# Patient Record
Sex: Male | Born: 1945 | Race: White | Hispanic: No | Marital: Married | Smoking: Never smoker
Health system: Southern US, Community
[De-identification: ages and names within clinical notes are randomized; demographics above are authoritative.]

## PROBLEM LIST (undated history)

## (undated) DIAGNOSIS — Z950 Presence of cardiac pacemaker: Secondary | ICD-10-CM

## (undated) DIAGNOSIS — N400 Enlarged prostate without lower urinary tract symptoms: Secondary | ICD-10-CM

## (undated) DIAGNOSIS — I495 Sick sinus syndrome: Secondary | ICD-10-CM

## (undated) DIAGNOSIS — R011 Cardiac murmur, unspecified: Secondary | ICD-10-CM

## (undated) DIAGNOSIS — I4821 Permanent atrial fibrillation: Secondary | ICD-10-CM

## (undated) HISTORY — DX: Presence of cardiac pacemaker: Z95.0

## (undated) HISTORY — DX: Permanent atrial fibrillation: I48.21

## (undated) HISTORY — DX: Benign prostatic hyperplasia without lower urinary tract symptoms: N40.0

## (undated) HISTORY — DX: Sick sinus syndrome: I49.5

## (undated) HISTORY — DX: Cardiac murmur, unspecified: R01.1

---

## 1997-01-26 HISTORY — PX: CARDIAC CATHETERIZATION: SHX172

## 2000-02-01 ENCOUNTER — Ambulatory Visit (HOSPITAL_COMMUNITY): Admission: RE | Admit: 2000-02-01 | Discharge: 2000-02-01 | Payer: Self-pay | Admitting: Orthopaedic Surgery

## 2001-04-14 ENCOUNTER — Encounter: Payer: Self-pay | Admitting: Cardiovascular Disease

## 2001-04-14 ENCOUNTER — Ambulatory Visit (HOSPITAL_COMMUNITY): Admission: RE | Admit: 2001-04-14 | Discharge: 2001-04-15 | Payer: Self-pay | Admitting: Cardiovascular Disease

## 2001-04-15 ENCOUNTER — Encounter: Payer: Self-pay | Admitting: Cardiovascular Disease

## 2004-02-29 ENCOUNTER — Ambulatory Visit (HOSPITAL_COMMUNITY): Admission: RE | Admit: 2004-02-29 | Discharge: 2004-02-29 | Payer: Self-pay | Admitting: Family Medicine

## 2004-08-31 DIAGNOSIS — C439 Malignant melanoma of skin, unspecified: Secondary | ICD-10-CM

## 2004-08-31 HISTORY — DX: Malignant melanoma of skin, unspecified: C43.9

## 2004-09-29 ENCOUNTER — Ambulatory Visit (HOSPITAL_COMMUNITY): Admission: RE | Admit: 2004-09-29 | Discharge: 2004-09-29 | Payer: Self-pay | Admitting: Cardiovascular Disease

## 2005-08-21 ENCOUNTER — Ambulatory Visit: Payer: Self-pay | Admitting: Internal Medicine

## 2005-08-29 ENCOUNTER — Ambulatory Visit: Payer: Self-pay | Admitting: Internal Medicine

## 2005-08-29 ENCOUNTER — Ambulatory Visit (HOSPITAL_COMMUNITY): Admission: RE | Admit: 2005-08-29 | Discharge: 2005-08-29 | Payer: Self-pay | Admitting: Internal Medicine

## 2005-08-29 ENCOUNTER — Encounter (INDEPENDENT_AMBULATORY_CARE_PROVIDER_SITE_OTHER): Payer: Self-pay | Admitting: Internal Medicine

## 2009-02-07 ENCOUNTER — Ambulatory Visit (HOSPITAL_COMMUNITY): Admission: RE | Admit: 2009-02-07 | Discharge: 2009-02-07 | Payer: Self-pay | Admitting: Family Medicine

## 2009-02-07 HISTORY — PX: OTHER SURGICAL HISTORY: SHX169

## 2010-04-24 ENCOUNTER — Ambulatory Visit (HOSPITAL_COMMUNITY): Admission: RE | Admit: 2010-04-24 | Discharge: 2010-04-24 | Payer: Self-pay | Admitting: Surgery

## 2010-04-24 HISTORY — PX: PACEMAKER INSERTION: SHX728

## 2010-09-21 ENCOUNTER — Ambulatory Visit: Admit: 2010-09-21 | Payer: Self-pay | Admitting: Gastroenterology

## 2010-10-03 ENCOUNTER — Encounter (INDEPENDENT_AMBULATORY_CARE_PROVIDER_SITE_OTHER): Payer: Self-pay

## 2010-10-26 NOTE — Letter (Signed)
Summary: Recall, Screening Colonoscopy Only  [facility]  [address]   [place], [place] [address]   Phone: [phone]  Fax: [phone]    October 03, 2010  Kevin Vazquez [address] [place], [place]  [address] 17-Feb-1946   Dear [name],   Our records indicate it is time to schedule your colonoscopy.   Please call our office at [phone] and ask for the nurse.   Thank you,  Hendricks Limes, LPN Cloria Spring, LPN  [facility] Ph: [phone]   Fax: [phone]

## 2010-12-08 LAB — SURGICAL PCR SCREEN
MRSA, PCR: NEGATIVE
Staphylococcus aureus: NEGATIVE

## 2010-12-08 LAB — PROTIME-INR
INR: 1.09 (ref 0.00–1.49)
Prothrombin Time: 14 seconds (ref 11.6–15.2)
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
## 2011-02-09 NOTE — Discharge Summary (Signed)
[place]  Patient:    NTHONY, LEFFERTS                       MRN: [id number] Adm. Date:  [id number] Disc. Date: 04/15/01 Attending:  Ruta Hinds Dictator:   Mancel Bale, P.A. CC:         Lilyan Punt, M.D.  Runell Gess, M.D.   Discharge Summary  PRIMARY CARE PHYSICIAN:  Lilyan Punt, M.D.  ADMISSION DIAGNOSES: 1. Significant bradycardia. 2. Hypotension. 3. Atrial fibrillation and sick sinus syndrome, status post event monitoring    showing heart rates anywhere from 45-220 beats per minute. 4. History of tilt table test showing moderate vasodepressor response. 5. Status post catheterization on Jan 30, 1997, with normal coronary arteries    and normal left ventricular function. 6. History of knee surgery, history of shoulder surgery, and history of    tonsillectomy.  DISCHARGE DIAGNOSES: 1. Significant bradycardia. 2. Hypotension. 3. Atrial fibrillation and sick sinus syndrome, status post event monitoring    showing heart rates anywhere from 45-220 beats per minute. 4. History of tilt table test showing moderate vasodepressor response. 5. Status post catheterization on Jan 30, 1997, with normal coronary arteries    and normal left ventricular function. 6. History of knee surgery, history of shoulder surgery, and history of    tonsillectomy. 7. Status post permanent transvenous pacemaker implantation on April 14, 2001,    by Richard A. Alanda Amass, M.D., with a VVIR Medtronic Kappa pacemaker.  HISTORY OF PRESENT ILLNESS:  Mr. Wuertz is a 65 year old white male who is a patient of Runell Gess, M.D., but was referred to Richard A. Alanda Amass, M.D., for evaluation of possible pacemaker implantation.  He has a long history of chronic atrial fibrillation dating back to 1998 when it was initially discovered on routine physical exam.  He had been on aspirin, but recently had been put on Coumadin.  As well, he has had a  tilt table test done in March of 1998 that showed no significant negative chromotropic response, but moderate vasodepressive response.  He has a history of hypotension and it has been difficult to control his heart rate.  He has symptoms of presyncope pounding in his chest, orthostatic hypotension, and lightheadedness.  He had been tried on beta blockers and calcium channel blockers, but developed severe atypical symptoms with these.  He had been seen by Nathen May, M.D., F.A.C.C., who offered for him  to participate in a FIRM trial, but the patient declined it.  Apparently he was also offered propafenone and flecainide antiarrhythmic therapy, but also declined these.  Richard A. Alanda Amass, M.D., decided to place him on an event monitor.  This was reviewed and he was found to have rates anywhere from 45-220 beats per minute.  It once dropped down to 30 with a 1.92 second pause.  It was felt that he had sick sinus syndrome and it was recommended by Richard A. Alanda Amass, M.D., that he have pacemaker implantation or antiarrhythmic medication or rate control medication. However, this will be difficult with his low blood pressures.  As well, the thought of ablation therapy was also entertained.  However, it was decided that we would first need a VVIR pacer and would see if his symptoms improved with more control of his heart rate.  Therefore, he was then planned to present as an outpatient for permanent transvenous pacemaker implantation by Richard A. Alanda Amass, M.D.  HOSPITAL COURSE:  On April 14, 2001, Mr. Pinzon underwent permanent transvenous pacemaker by Richard A. Alanda Amass, M.D.  Please see his dictated procedure report for further detail.  He performed implantation of permanent single-chamber VVIR multiprogrammable Medtronic Kappa [id number] pulse generator, SN [id number], with passive fixation timed Medtronic #[id number] ventricular electrode, SN [id number], steroid eluding IS-1  connector.  Please see the dictator report for further detail.  The patient tolerated the procedure well without complication.  On April 15, 2001, Ms. Schriefer is doing well.  He denies any chest pain or shortness of breath and has been ambulating in the room without difficulty. He is having no dizziness or shortness of breath.  His pacer site looks excellent without hematoma or drainage or bleed.  He was seen by Gerlene Burdock A. Alanda Amass, M.D., who wanted to initiate Cardizem therapy now that he had pacemaker implantation.  We would restart his Coumadin therapy and discharge him home at this point.  HOSPITAL CONSULTS:  None.  HOSPITAL PROCEDURES: 1. Permanent transvenous pacemaker implantation by Richard A. Alanda Amass, M.D.,    on April 14, 2001.  This was an implantation of a permanent single-chamber    VVIR multiprogrammable Medtronic Kappa [id number] pulse generator, SN    [id number], passive fixation timed Medronic [id number] ventricular    electrode, SN [id number].  Steroid eluding IS-1 connector.  He tolerated    the procedure well without complication.  Please see the dictated report    for further detail. 2. Electrocardiogram on April 14, 2001, showed atrial fibrillation at 50 beats    per minute. 3. Electrocardiogram on April 14, 2001, at 1428 hours showed AV pacing at    69 beats per minute.  RADIOLOGY:  Chest x-ray on April 14, 2001, showed no active disease.  LABORATORY DATA:  On April 14, 2001, the PT was 13.0 and INR 1.0.  Preoperative labs showed sodium 142, potassium 4.5, BUN 17, creatinine 1.0, and glucose 93. WBC 5.3, hemoglobin 13.1, hematocrit 37.4, platelets 171.  TSH 1.748.  Digoxin level 0.9.  DISCHARGE MEDICATIONS: 1. Cardizem CD 180 mg once a day. 2. Digoxin 0.25 mg once a day. 3. Coumadin 5 mg tablets.  He takes one and a half each day, except for two    tablets on Wednesdays and Saturdays.  This is a 7.5 mg every day, except    for 10 mg on Wednesdays and  Saturdays.   DISCHARGE INSTRUCTIONS:  He was instructed of the following:  Do not wash site for four days.  After four days, may wash, but pat dry.  Do not remove Steri-Strips.  Do not stretch or lift with the left arm.  No motion above the head for two weeks of the left arm, including hair brushing.  No driving until you see Richard A. Alanda Amass, M.D.  Do not return to work until you see Richard A. Alanda Amass, M.D.  Call the office at [phone] if there is any bleeding, draining, or bruising of your pacemaker site.  Have blood drawn to check a PT INR by Loma Newton before seeing Richard A. Alanda Amass, M.D., on April 25, 2001.  He has an appointment to follow up with Richard A. Alanda Amass, M.D., for pacemaker check on April 25, 2001, at [place] p.[place]. in the [place], [place], office. DD:  04/15/01 TD:  04/16/01 Job: [id number]

## 2011-02-09 NOTE — Op Note (Signed)
Kevin Vazquez, Kevin Vazquez                ACCOUNT NO.:  [id number]   MEDICAL RECORD NO.:  [id number]          PATIENT TYPE:  AMB   LOCATION:  DAY                           FACILITY:  [facility]   PHYSICIAN:  Lionel December, M.D.    DATE OF BIRTH:  January 18, 1946   DATE OF PROCEDURE:  08/29/2005  DATE OF DISCHARGE:                                 OPERATIVE REPORT   PROCEDURE:  Colonoscopy with polypectomy.   ENDOSCOPIST:  Lionel December, M.D.   INDICATIONS:  Kevin Vazquez is a 65 year old Caucasian male with a single episode of  hematochezia following a  rectal exam, possibly traumatic.  He is undergoing  colonoscopy to make sure he does not have another lesion to account for his  single episode of bleeding.  Family history is negative for colorectal  carcinoma.  Procedures were reviewed the patient, informed consent was  obtained.   MEDICINES GIVEN:  Medicines used for conscious sedation Demerol 25 mg IV,  Versed 5 mg IV.   DESCRIPTION OF PROCEDURE:  The procedure performed in endoscopy suite.  The  patient's vital signs and O2 sat were monitored during the procedure and  remained stable.  The patient was placed in left lateral position and rectal  examination performed.  No abnormality noted on external or digital exam.  Olympus videoscope was placed in the rectum and advanced under vision into  sigmoid colon and beyond.  Preparation was excellent.  Scope was passed and  cecum which was identified by appendiceal orifice, ileocecal valve.  As the  scope was withdrawn colonic mucosa was carefully examined.  There was a  single small polyp at descending colon which was ablated via cold biopsy.  There was a second 6-7 mm polyp at sigmoid colon, which was snared and  retrieved for histologic examination.  Single diverticulum at sigmoid colon  was also noted.  Mucosa rest of the colon and rectum was normal. While in  the rectum, the scope was retroflexed to examine anorectal junction and he  had small  hemorrhoids below the dentate line. Endoscope was straightened and  withdrawn. The patient tolerated the procedure well.   FINAL DIAGNOSES:  1.  Small polyp ablated via cold biopsy from descending colon.  2.  Another 6 mm polyp snared from the sigmoid colon.  3.  Single diverticulum in the sigmoid colon.  4.  External hemorrhoids.   RECOMMENDATIONS:  1.  Standard instructions given.  2.  He should continue high-fiber diet.  3.  He will resume his Coumadin starting August 30, 2005.  4.  He will have his INR checked in 7-10 days.  5.  I will be contacting the patient with biopsy results and further      recommendations.      Lionel December, M.D.  Electronically Signed     NR/MEDQ  D:  08/29/2005  T:  08/29/2005  Job:  [id number]   cc:   Lorin Picket A. Gerda Diss, MD  Fax: [phone]
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
## 2011-05-02 HISTORY — PX: CARDIOVASCULAR STRESS TEST: SHX262

## 2011-07-05 ENCOUNTER — Other Ambulatory Visit: Payer: Self-pay | Admitting: Family Medicine

## 2011-07-05 ENCOUNTER — Ambulatory Visit (HOSPITAL_COMMUNITY)
Admission: RE | Admit: 2011-07-05 | Discharge: 2011-07-05 | Disposition: A | Discharge status: Home / Self Care | Payer: Medicare Other | Source: Ambulatory Visit | Attending: Family Medicine | Admitting: Family Medicine

## 2011-07-05 DIAGNOSIS — M5410 Radiculopathy, site unspecified: Secondary | ICD-10-CM

## 2011-07-05 DIAGNOSIS — M5030 Other cervical disc degeneration, unspecified cervical region: Secondary | ICD-10-CM | POA: Insufficient documentation

## 2011-07-05 DIAGNOSIS — M47812 Spondylosis without myelopathy or radiculopathy, cervical region: Secondary | ICD-10-CM | POA: Insufficient documentation

## 2011-07-05 DIAGNOSIS — M542 Cervicalgia: Secondary | ICD-10-CM | POA: Insufficient documentation

## 2011-10-26 DIAGNOSIS — R5383 Other fatigue: Secondary | ICD-10-CM | POA: Diagnosis not present

## 2011-10-26 DIAGNOSIS — Z45018 Encounter for adjustment and management of other part of cardiac pacemaker: Secondary | ICD-10-CM | POA: Diagnosis not present

## 2011-10-26 DIAGNOSIS — Z7901 Long term (current) use of anticoagulants: Secondary | ICD-10-CM | POA: Diagnosis not present

## 2011-10-26 DIAGNOSIS — R5381 Other malaise: Secondary | ICD-10-CM | POA: Diagnosis not present

## 2011-10-26 DIAGNOSIS — I495 Sick sinus syndrome: Secondary | ICD-10-CM | POA: Diagnosis not present

## 2011-10-26 DIAGNOSIS — E782 Mixed hyperlipidemia: Secondary | ICD-10-CM | POA: Diagnosis not present

## 2011-10-26 DIAGNOSIS — E119 Type 2 diabetes mellitus without complications: Secondary | ICD-10-CM | POA: Diagnosis not present

## 2011-10-26 DIAGNOSIS — Z79899 Other long term (current) drug therapy: Secondary | ICD-10-CM | POA: Diagnosis not present

## 2011-10-26 DIAGNOSIS — I4891 Unspecified atrial fibrillation: Secondary | ICD-10-CM | POA: Diagnosis not present

## 2012-02-29 DIAGNOSIS — Z7901 Long term (current) use of anticoagulants: Secondary | ICD-10-CM | POA: Diagnosis not present

## 2012-04-08 DIAGNOSIS — Z7901 Long term (current) use of anticoagulants: Secondary | ICD-10-CM | POA: Diagnosis not present

## 2012-05-09 DIAGNOSIS — L570 Actinic keratosis: Secondary | ICD-10-CM | POA: Diagnosis not present

## 2012-05-09 DIAGNOSIS — D485 Neoplasm of uncertain behavior of skin: Secondary | ICD-10-CM | POA: Diagnosis not present

## 2012-05-19 DIAGNOSIS — I495 Sick sinus syndrome: Secondary | ICD-10-CM | POA: Diagnosis not present

## 2012-05-19 DIAGNOSIS — Z45018 Encounter for adjustment and management of other part of cardiac pacemaker: Secondary | ICD-10-CM | POA: Diagnosis not present

## 2012-05-19 DIAGNOSIS — I959 Hypotension, unspecified: Secondary | ICD-10-CM | POA: Diagnosis not present

## 2012-05-19 DIAGNOSIS — I4891 Unspecified atrial fibrillation: Secondary | ICD-10-CM | POA: Diagnosis not present

## 2012-05-22 ENCOUNTER — Encounter (INDEPENDENT_AMBULATORY_CARE_PROVIDER_SITE_OTHER): Payer: Self-pay | Admitting: *Deleted

## 2012-07-01 DIAGNOSIS — R5383 Other fatigue: Secondary | ICD-10-CM | POA: Diagnosis not present

## 2012-07-01 DIAGNOSIS — N429 Disorder of prostate, unspecified: Secondary | ICD-10-CM | POA: Diagnosis not present

## 2012-07-01 DIAGNOSIS — E782 Mixed hyperlipidemia: Secondary | ICD-10-CM | POA: Diagnosis not present

## 2012-07-01 DIAGNOSIS — R6889 Other general symptoms and signs: Secondary | ICD-10-CM | POA: Diagnosis not present

## 2012-07-01 DIAGNOSIS — R5381 Other malaise: Secondary | ICD-10-CM | POA: Diagnosis not present

## 2012-07-01 DIAGNOSIS — M109 Gout, unspecified: Secondary | ICD-10-CM | POA: Diagnosis not present

## 2012-07-17 DIAGNOSIS — H6120 Impacted cerumen, unspecified ear: Secondary | ICD-10-CM | POA: Diagnosis not present

## 2012-07-17 DIAGNOSIS — Z23 Encounter for immunization: Secondary | ICD-10-CM | POA: Diagnosis not present

## 2012-07-24 ENCOUNTER — Encounter (INDEPENDENT_AMBULATORY_CARE_PROVIDER_SITE_OTHER): Payer: Self-pay | Admitting: *Deleted

## 2012-10-16 DIAGNOSIS — Z7901 Long term (current) use of anticoagulants: Secondary | ICD-10-CM | POA: Diagnosis not present

## 2012-10-16 DIAGNOSIS — H6980 Other specified disorders of Eustachian tube, unspecified ear: Secondary | ICD-10-CM | POA: Diagnosis not present

## 2012-10-29 ENCOUNTER — Encounter (INDEPENDENT_AMBULATORY_CARE_PROVIDER_SITE_OTHER): Payer: Self-pay | Admitting: *Deleted

## 2012-11-04 DIAGNOSIS — I4891 Unspecified atrial fibrillation: Secondary | ICD-10-CM | POA: Diagnosis not present

## 2012-11-04 DIAGNOSIS — I447 Left bundle-branch block, unspecified: Secondary | ICD-10-CM | POA: Diagnosis not present

## 2012-11-04 DIAGNOSIS — Z45018 Encounter for adjustment and management of other part of cardiac pacemaker: Secondary | ICD-10-CM | POA: Diagnosis not present

## 2012-11-13 ENCOUNTER — Other Ambulatory Visit (HOSPITAL_COMMUNITY): Payer: Self-pay | Admitting: Cardiovascular Disease

## 2012-11-13 DIAGNOSIS — I4891 Unspecified atrial fibrillation: Secondary | ICD-10-CM

## 2012-11-13 DIAGNOSIS — I447 Left bundle-branch block, unspecified: Secondary | ICD-10-CM

## 2012-11-20 DIAGNOSIS — J019 Acute sinusitis, unspecified: Secondary | ICD-10-CM | POA: Diagnosis not present

## 2012-11-26 ENCOUNTER — Ambulatory Visit (HOSPITAL_COMMUNITY)
Admission: RE | Admit: 2012-11-26 | Discharge: 2012-11-26 | Disposition: A | Discharge status: Home / Self Care | Payer: Medicare Other | Source: Ambulatory Visit | Attending: Cardiovascular Disease | Admitting: Cardiovascular Disease

## 2012-11-26 DIAGNOSIS — I447 Left bundle-branch block, unspecified: Secondary | ICD-10-CM | POA: Diagnosis not present

## 2012-11-26 DIAGNOSIS — I4891 Unspecified atrial fibrillation: Secondary | ICD-10-CM | POA: Insufficient documentation

## 2012-11-26 HISTORY — PX: TRANSTHORACIC ECHOCARDIOGRAM: SHX275

## 2012-11-26 NOTE — Progress Notes (Signed)
[facility]   2D echo completed 11/26/2012.   Cindy Audriana Aldama, RDCS  

## 2012-12-05 IMAGING — CR DG CERVICAL SPINE COMPLETE 4+V
6 series · 6 of 6 positions shown · non-contrast
Comparison: None.

CLINICAL DATA: Neck pain with right arm radiculopathy

CERVICAL SPINE - COMPLETE 4+ VIEW

[view not recorded (1 of 6)]
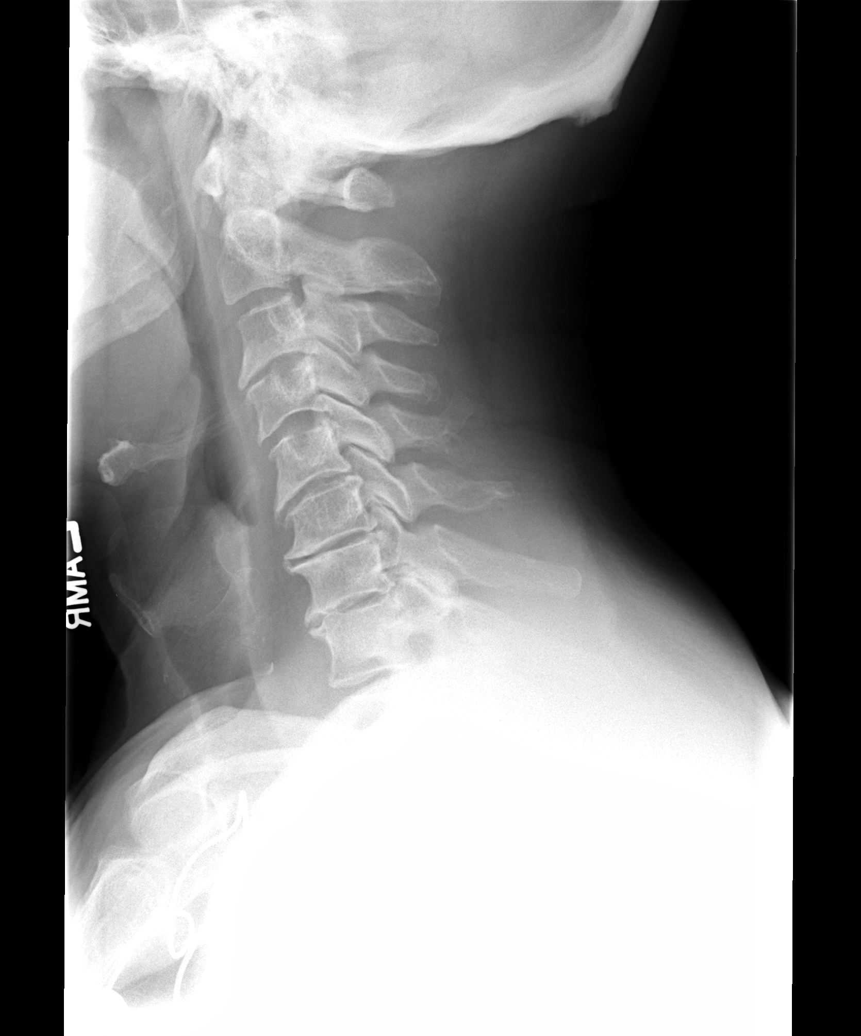

[view not recorded (2 of 6)]
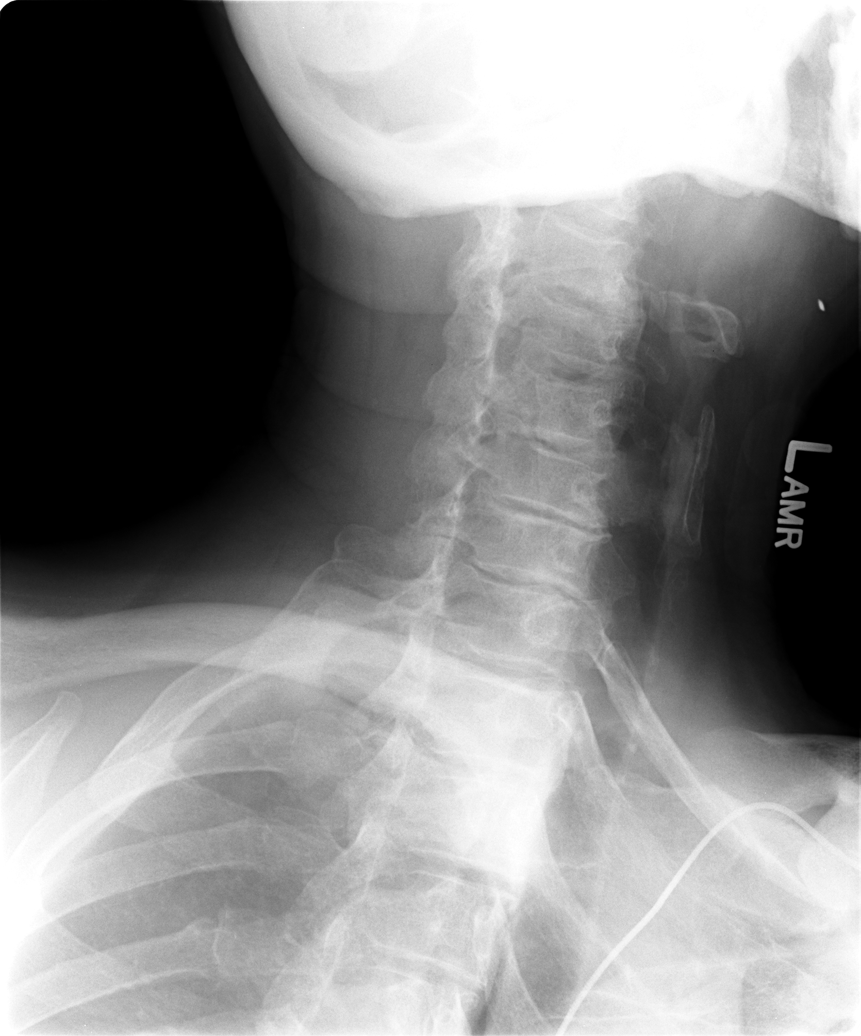

[view not recorded (3 of 6)]
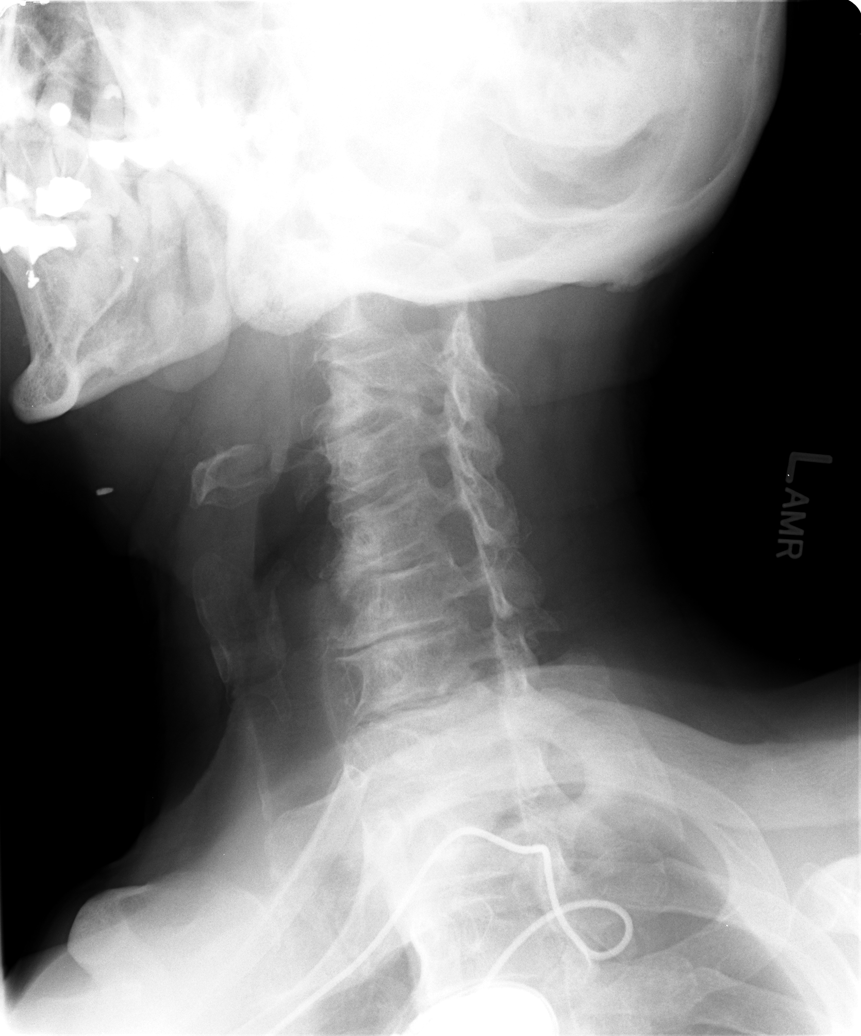

[view not recorded (4 of 6)]
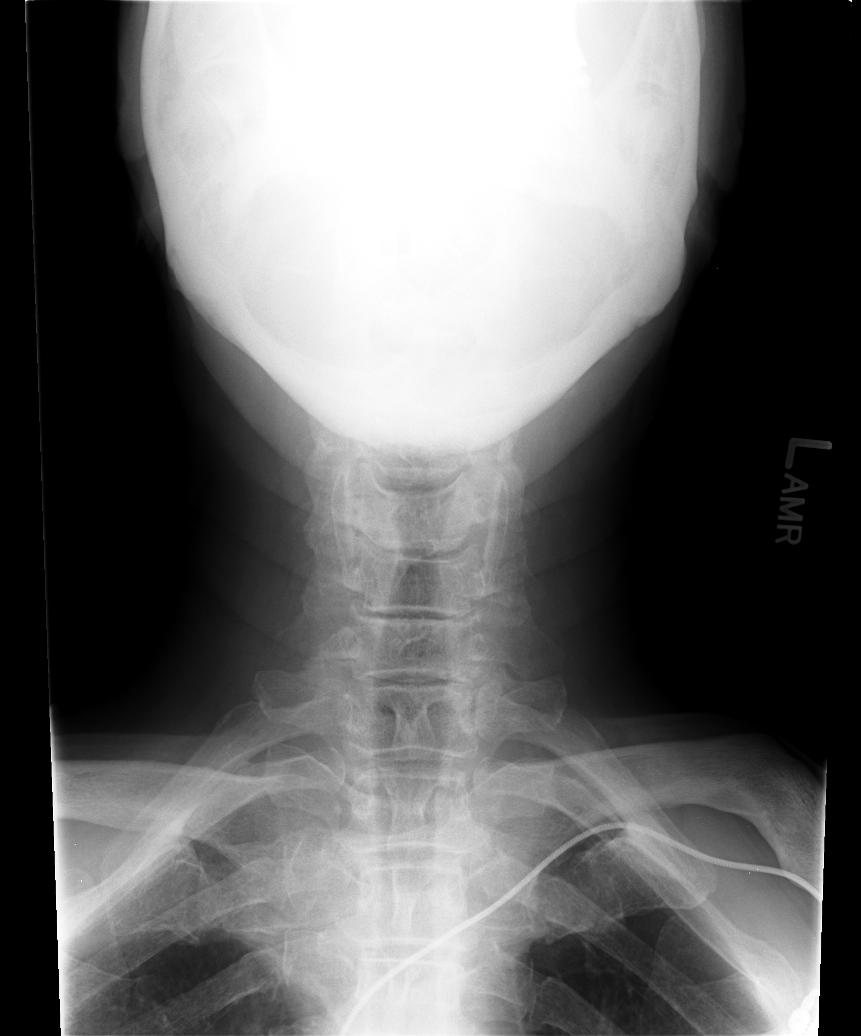

[view not recorded (5 of 6)]
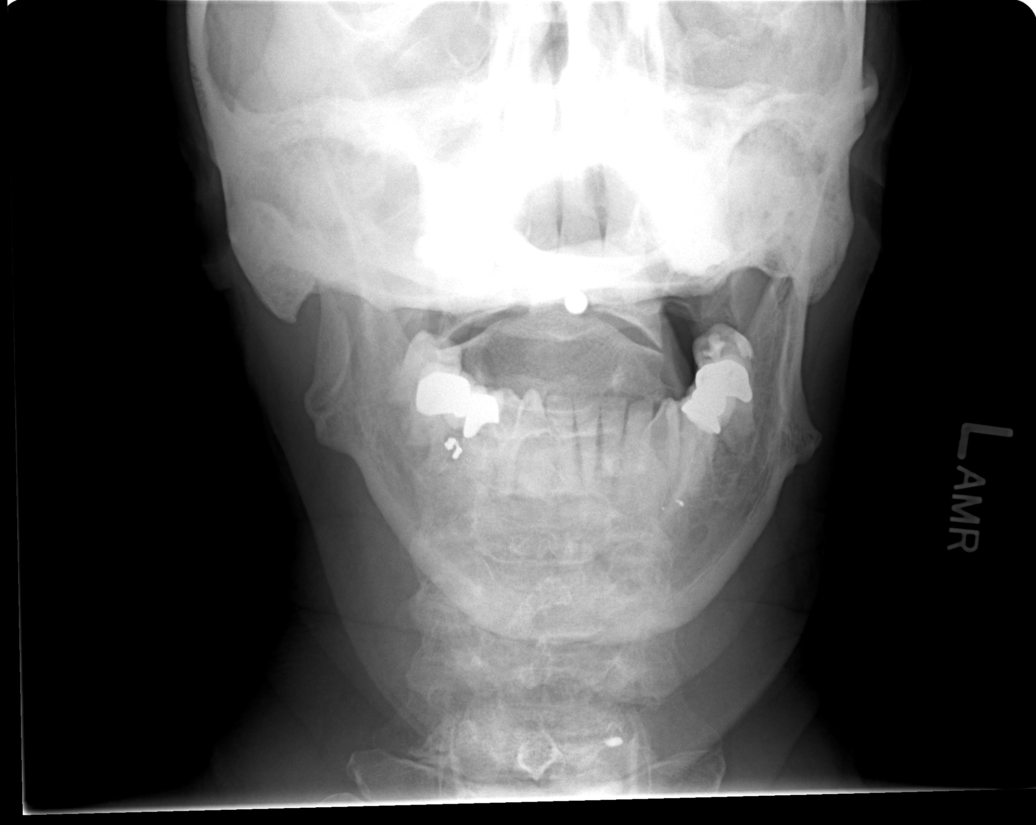

[view not recorded (6 of 6)]
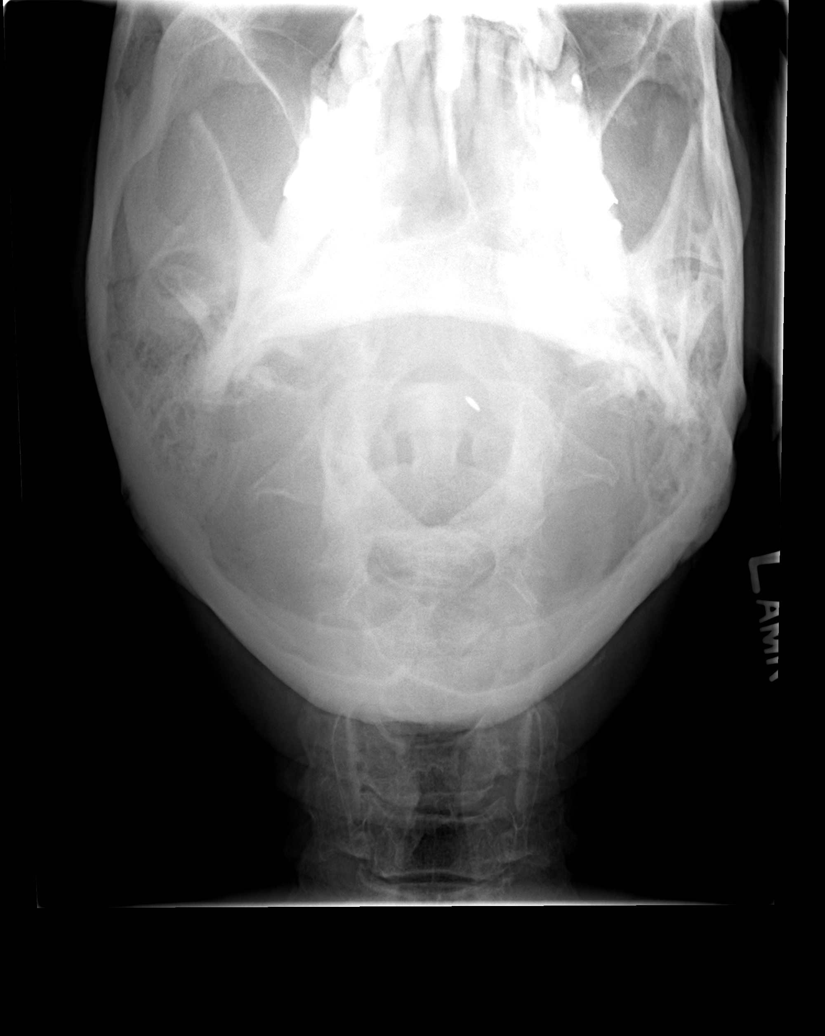

[6 of 6 positions shown; findings below may reference images not displayed]

FINDINGS: Straightening of the cervical lordosis.  Normal
alignment.  Multilevel disc degeneration and spondylosis throughout
the cervical spine from C3-T1.  There is relatively sparing at C4-
5.  Prominent osteophytes are present at C3-4, C4-5, C5-6, and C6-7
causing moderate right foraminal encroachment at these levels.
Mild foraminal narrowing on the left at C3-4 and C6-7.

Negative for fracture or mass.  No acute bony abnormality.
IMPRESSION: Moderate to advanced cervical disc degeneration and multilevel
spondylosis, right greater than left.

## 2012-12-16 DIAGNOSIS — H6520 Chronic serous otitis media, unspecified ear: Secondary | ICD-10-CM | POA: Diagnosis not present

## 2012-12-16 DIAGNOSIS — Z7901 Long term (current) use of anticoagulants: Secondary | ICD-10-CM | POA: Diagnosis not present

## 2012-12-16 DIAGNOSIS — H6120 Impacted cerumen, unspecified ear: Secondary | ICD-10-CM | POA: Diagnosis not present

## 2012-12-16 DIAGNOSIS — H6980 Other specified disorders of Eustachian tube, unspecified ear: Secondary | ICD-10-CM | POA: Diagnosis not present

## 2013-01-09 ENCOUNTER — Encounter: Payer: Self-pay | Admitting: Pharmacist Clinician (PhC)/ Clinical Pharmacy Specialist

## 2013-01-09 DIAGNOSIS — Z7901 Long term (current) use of anticoagulants: Secondary | ICD-10-CM | POA: Insufficient documentation

## 2013-01-09 DIAGNOSIS — I4891 Unspecified atrial fibrillation: Secondary | ICD-10-CM | POA: Insufficient documentation

## 2013-01-28 DIAGNOSIS — H9190 Unspecified hearing loss, unspecified ear: Secondary | ICD-10-CM | POA: Diagnosis not present

## 2013-01-28 DIAGNOSIS — H6980 Other specified disorders of Eustachian tube, unspecified ear: Secondary | ICD-10-CM | POA: Diagnosis not present

## 2013-03-03 ENCOUNTER — Other Ambulatory Visit: Payer: Self-pay | Admitting: Cardiovascular Disease

## 2013-03-03 DIAGNOSIS — Z7901 Long term (current) use of anticoagulants: Secondary | ICD-10-CM | POA: Diagnosis not present

## 2013-03-03 DIAGNOSIS — I4891 Unspecified atrial fibrillation: Secondary | ICD-10-CM | POA: Diagnosis not present

## 2013-03-03 DIAGNOSIS — R5381 Other malaise: Secondary | ICD-10-CM | POA: Diagnosis not present

## 2013-03-03 DIAGNOSIS — I495 Sick sinus syndrome: Secondary | ICD-10-CM | POA: Diagnosis not present

## 2013-03-03 DIAGNOSIS — E782 Mixed hyperlipidemia: Secondary | ICD-10-CM | POA: Diagnosis not present

## 2013-03-03 DIAGNOSIS — H908 Mixed conductive and sensorineural hearing loss, unspecified: Secondary | ICD-10-CM | POA: Diagnosis not present

## 2013-03-03 DIAGNOSIS — H6980 Other specified disorders of Eustachian tube, unspecified ear: Secondary | ICD-10-CM | POA: Diagnosis not present

## 2013-03-03 DIAGNOSIS — H6520 Chronic serous otitis media, unspecified ear: Secondary | ICD-10-CM | POA: Diagnosis not present

## 2013-03-03 DIAGNOSIS — M1710 Unilateral primary osteoarthritis, unspecified knee: Secondary | ICD-10-CM | POA: Diagnosis not present

## 2013-03-03 DIAGNOSIS — R6889 Other general symptoms and signs: Secondary | ICD-10-CM | POA: Diagnosis not present

## 2013-03-03 DIAGNOSIS — Z45018 Encounter for adjustment and management of other part of cardiac pacemaker: Secondary | ICD-10-CM | POA: Diagnosis not present

## 2013-03-03 LAB — COMPREHENSIVE METABOLIC PANEL
AST: 20 U/L (ref 0–37)
BUN: 17 mg/dL (ref 6–23)
CO2: 28 mEq/L (ref 19–32)
Calcium: 9.3 mg/dL (ref 8.4–10.5)
Chloride: 105 mEq/L (ref 96–112)
Creat: 1.21 mg/dL (ref 0.50–1.35)
Total Bilirubin: 0.6 mg/dL (ref 0.3–1.2)

## 2013-03-03 LAB — CBC WITH DIFFERENTIAL/PLATELET
Basophils Absolute: 0 10*3/uL (ref 0.0–0.1)
Basophils Relative: 0 % (ref 0–1)
Eosinophils Absolute: 0.1 10*3/uL (ref 0.0–0.7)
Hemoglobin: 15.2 g/dL (ref 13.0–17.0)
MCH: 31 pg (ref 26.0–34.0)
MCHC: 36.4 g/dL — ABNORMAL HIGH (ref 30.0–36.0)
Monocytes Relative: 11 % (ref 3–12)
Neutrophils Relative %: 57 % (ref 43–77)
Platelets: 174 10*3/uL (ref 150–400)
RDW: 14.6 % (ref 11.5–15.5)

## 2013-03-03 LAB — PROTIME-INR: Prothrombin Time: 24.4 seconds — ABNORMAL HIGH (ref 11.6–15.2)

## 2013-03-03 LAB — LIPID PANEL
Cholesterol: 164 mg/dL (ref 0–200)
HDL: 37 mg/dL — ABNORMAL LOW (ref >39)
Total CHOL/HDL Ratio: 4.4 Ratio

## 2013-03-05 ENCOUNTER — Encounter: Payer: Self-pay | Admitting: Cardiovascular Disease

## 2013-03-16 ENCOUNTER — Other Ambulatory Visit: Payer: Self-pay | Admitting: Pharmacist Clinician (PhC)/ Clinical Pharmacy Specialist

## 2013-03-16 DIAGNOSIS — H6980 Other specified disorders of Eustachian tube, unspecified ear: Secondary | ICD-10-CM | POA: Diagnosis not present

## 2013-03-16 MED ORDER — WARFARIN SODIUM 5 MG PO TABS: ORAL_TABLET | ORAL | Status: DC

## 2013-03-20 DIAGNOSIS — L570 Actinic keratosis: Secondary | ICD-10-CM | POA: Diagnosis not present

## 2013-03-30 ENCOUNTER — Other Ambulatory Visit: Payer: Self-pay | Admitting: Pharmacist Clinician (PhC)/ Clinical Pharmacy Specialist

## 2013-03-30 MED ORDER — WARFARIN SODIUM 5 MG PO TABS: ORAL_TABLET | ORAL | Status: DC

## 2013-04-21 DIAGNOSIS — D689 Coagulation defect, unspecified: Secondary | ICD-10-CM | POA: Diagnosis not present

## 2013-04-21 DIAGNOSIS — H6520 Chronic serous otitis media, unspecified ear: Secondary | ICD-10-CM | POA: Diagnosis not present

## 2013-04-21 DIAGNOSIS — J301 Allergic rhinitis due to pollen: Secondary | ICD-10-CM | POA: Diagnosis not present

## 2013-04-21 DIAGNOSIS — H6980 Other specified disorders of Eustachian tube, unspecified ear: Secondary | ICD-10-CM | POA: Diagnosis not present

## 2013-05-14 ENCOUNTER — Other Ambulatory Visit: Payer: Self-pay | Admitting: Pharmacist Clinician (PhC)/ Clinical Pharmacy Specialist

## 2013-05-18 ENCOUNTER — Other Ambulatory Visit: Payer: Self-pay | Admitting: Cardiovascular Disease

## 2013-05-18 DIAGNOSIS — Z7901 Long term (current) use of anticoagulants: Secondary | ICD-10-CM | POA: Diagnosis not present

## 2013-05-18 LAB — PROTIME-INR: INR: 2.14 — ABNORMAL HIGH (ref <1.50)

## 2013-05-19 ENCOUNTER — Ambulatory Visit (INDEPENDENT_AMBULATORY_CARE_PROVIDER_SITE_OTHER): Payer: Self-pay | Admitting: Pharmacist Clinician (PhC)/ Clinical Pharmacy Specialist

## 2013-05-19 DIAGNOSIS — I4891 Unspecified atrial fibrillation: Secondary | ICD-10-CM

## 2013-05-19 DIAGNOSIS — Z7901 Long term (current) use of anticoagulants: Secondary | ICD-10-CM

## 2013-06-05 DIAGNOSIS — B079 Viral wart, unspecified: Secondary | ICD-10-CM | POA: Diagnosis not present

## 2013-06-05 DIAGNOSIS — T148 Other injury of unspecified body region: Secondary | ICD-10-CM | POA: Diagnosis not present

## 2013-06-05 DIAGNOSIS — L905 Scar conditions and fibrosis of skin: Secondary | ICD-10-CM | POA: Diagnosis not present

## 2013-06-05 DIAGNOSIS — D485 Neoplasm of uncertain behavior of skin: Secondary | ICD-10-CM | POA: Diagnosis not present

## 2013-06-05 DIAGNOSIS — L570 Actinic keratosis: Secondary | ICD-10-CM | POA: Diagnosis not present

## 2013-07-14 ENCOUNTER — Encounter: Payer: Self-pay | Admitting: Cardiovascular Disease

## 2013-07-14 ENCOUNTER — Ambulatory Visit (INDEPENDENT_AMBULATORY_CARE_PROVIDER_SITE_OTHER): Payer: Medicare Other | Admitting: Pharmacist Clinician (PhC)/ Clinical Pharmacy Specialist

## 2013-07-14 ENCOUNTER — Ambulatory Visit (INDEPENDENT_AMBULATORY_CARE_PROVIDER_SITE_OTHER): Payer: Medicare Other | Admitting: Cardiovascular Disease

## 2013-07-14 VITALS — BP 100/70

## 2013-07-14 VITALS — BP 100/70 | HR 72 | Resp 20 | Ht 70.0 in | Wt 231.0 lb

## 2013-07-14 DIAGNOSIS — I4891 Unspecified atrial fibrillation: Secondary | ICD-10-CM

## 2013-07-14 DIAGNOSIS — Z7901 Long term (current) use of anticoagulants: Secondary | ICD-10-CM | POA: Diagnosis not present

## 2013-07-14 DIAGNOSIS — Z950 Presence of cardiac pacemaker: Secondary | ICD-10-CM | POA: Diagnosis not present

## 2013-07-14 LAB — PACEMAKER DEVICE OBSERVATION
BMOD-0003RV: 30
BRDY-0004RV: 130 {beats}/min
VENTRICULAR PACING PM: 63.5

## 2013-07-14 LAB — POCT INR: INR: 1.8

## 2013-07-14 NOTE — Patient Instructions (Signed)
Your physician recommends that you schedule a follow-up appointment in: 6 months  

## 2013-07-21 ENCOUNTER — Other Ambulatory Visit: Payer: Self-pay | Admitting: Pharmacist Clinician (PhC)/ Clinical Pharmacy Specialist

## 2013-07-26 DIAGNOSIS — Z950 Presence of cardiac pacemaker: Secondary | ICD-10-CM | POA: Insufficient documentation

## 2013-07-26 NOTE — Progress Notes (Signed)
Patient ID: Kevin Vazquez, male   DOB: 28-Dec-1945, 67 y.o.   MRN: [id number]      Reason for office visit Atrial fibrillation, pacemaker  Mr. Boening is a patient of Dr. Susa Griffins who is recently retired. I did meet him when I performed a generator change out in 2011. He has been on long-standing warfarin anticoagulation without any bleeding complication and does not have a history of embolic events. He has no evidence of meaningful structural heart disease other than moderate left atrial dilatation and mild LVH. He has no complaints today.  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 He is in permanent atrial fibrillation and paces the ventricle roughly 25% of the time. He denies syncope, dizziness or falls. He tends to always run a relatively low blood pressure.   No Known Allergies  Current Outpatient Prescriptions  Medication Sig Dispense Refill  . atenolol (TENORMIN) 25 MG tablet Take 12.5 mg by mouth daily.      . digoxin (LANOXIN) 0.25 MG tablet Take 0.25 mg by mouth daily.      . fish oil-omega-3 fatty acids 1000 MG capsule Take 1 g by mouth daily.      Marland Kitchen OVER THE COUNTER MEDICATION Super Beta Prostate. Take 1 tablet by mouth daily.      . psyllium (METAMUCIL) 58.6 % packet Take 1 packet by mouth daily.      . simvastatin (ZOCOR) 40 MG tablet Take 60 mg by mouth at bedtime.      . tamsulosin (FLOMAX) 0.4 MG CAPS capsule Take 0.4 mg by mouth daily.      Marland Kitchen warfarin (COUMADIN) 5 MG tablet TAKE 1& 1/2 TABLETS TO 2 TABLETS BY MOUTH EVERY DAY  135 tablet  0   No current facility-administered medications for this visit.    Past Medical History  Diagnosis Date  . Sick sinus syndrome   . Atrial fibrillation, permanent     Past Surgical History  Procedure Laterality Date  . Pacemaker insertion  04/24/2010    Medtronic Adapta, model #ADSRO1, serial [id number]  . Left lower extremity venous doppler  02/07/2009   3.8x1.3x2.8cm hypoechoic area in lateral L thigh. No evidence of DVT.  Marland Kitchen Cardiac catheterization  01/26/1997    Normal LV function. Normal coronary arteries.  . Cardiovascular stress test  05/02/2011    Normal perfusion scan demonstrating diaphragmatic artifact. No significant ischemia demonstrated   . Transthoracic echocardiogram  11/26/2012    EF 50-55%, LA-appendage was moderately dilated    No family history on file.  History   Social History  . Marital Status: Married    Spouse Name: N/A    Number of Children: N/A  . Years of Education: N/A   Occupational History  . Not on file.   Social History Main Topics  . Smoking status: Never Smoker   . Smokeless tobacco: Former Neurosurgeon    Types: Chew  . Alcohol Use: 3.5 oz/week    7 drink(s) per week  . Drug Use: No  . Sexual Activity: Not on file   Other Topics Concern  . Not on file   Social History Narrative  . No narrative on file    Review of systems: The patient specifically denies any chest pain at rest or with exertion, dyspnea at rest or with exertion, orthopnea, paroxysmal nocturnal dyspnea, syncope, palpitations, focal neurological deficits, intermittent claudication, lower extremity edema, unexplained weight gain, cough, hemoptysis or wheezing.  The  patient also denies abdominal pain, nausea, vomiting, dysphagia, diarrhea, constipation, polyuria, polydipsia, dysuria, hematuria, frequency, urgency, abnormal bleeding or bruising, fever, chills, unexpected weight changes, mood swings, change in skin or hair texture, change in voice quality, auditory or visual problems, allergic reactions or rashes, new musculoskeletal complaints other than usual "aches and pains".   PHYSICAL EXAM BP 100/70  Pulse 72  Resp 20  Ht 5\' 10"  (1.778 m)  Wt 231 lb (104.781 kg)  BMI 33.15 kg/m2  General: Alert, oriented x3, no distress Head: no evidence of trauma, PERRL, EOMI, no exophtalmos or lid lag, no myxedema, no xanthelasma; normal ears,  nose and oropharynx Neck: normal jugular venous pulsations and no hepatojugular reflux; brisk carotid pulses without delay and no carotid bruits Chest: clear to auscultation, no signs of consolidation by percussion or palpation, normal fremitus, symmetrical and full respiratory excursions; healthy Cardiovascular: normal position and quality of the apical impulse, regular rhythm, normal first and paradoxically split second heart sounds, no murmurs, rubs or gallops Abdomen: no tenderness or distention, no masses by palpation, no abnormal pulsatility or arterial bruits, normal bowel sounds, no hepatosplenomegaly Extremities: no clubbing, cyanosis or edema; 2+ radial, ulnar and brachial pulses bilaterally; 2+ right femoral, posterior tibial and dorsalis pedis pulses; 2+ left femoral, posterior tibial and dorsalis pedis pulses; no subclavian or femoral bruits Neurological: grossly nonfocal   EKG: Atrial fibrillation with 100% ventricular pacing  Lipid Panel     Component Value Date/Time   CHOL 164 03/03/2013 0950   TRIG 131 03/03/2013 0950   HDL 37* 03/03/2013 0950   CHOLHDL 4.4 03/03/2013 0950   VLDL 26 03/03/2013 0950   LDLCALC 101* 03/03/2013 0950    BMET    Component Value Date/Time   NA 140 03/03/2013 0950   K 4.7 03/03/2013 0950   CL 105 03/03/2013 0950   CO2 28 03/03/2013 0950   GLUCOSE 95 03/03/2013 0950   BUN 17 03/03/2013 0950   CREATININE 1.21 03/03/2013 0950   CALCIUM 9.3 03/03/2013 0950     ASSESSMENT AND PLAN Atrial fibrillation Rate control appears to be good. He is on appropriate anticoagulation therapy with warfarin. No recent bleeding complications and no history of stroke or TIA. Note a long-standing history of relatively low blood pressure. He requires a very small dose of beta blocker for rate control and has not had symptomatic hypotension.  Pacemaker - Medtronic adapta single chamber implanted August 2011 Pacemaker check in clinic. Normal device function. Threshold,  sensing, and impedance consistent with previous measurements. 6 high ventricular rates noted---longest 28 sec, Max V 208---AF with RVR. Histogram distribution appropriate for patient activity level.Estimated longevity 7 years. Patient will follow up with MD in 6 months. Encouraged to do remote pacemaker checks every 3 months.   Orders Placed This Encounter  Procedures  . Pacemaker Device Observation  . EKG 12-Lead   No orders of the defined types were placed in this encounter.    Junious Silk, MD, [facility] [phone] office [phone] pager

## 2013-07-26 NOTE — Assessment & Plan Note (Signed)
Pacemaker check in clinic. Normal device function. Threshold, sensing, and impedance consistent with previous measurements. 6 high ventricular rates noted---longest 28 sec, Max V 208---AF with RVR. Histogram distribution appropriate for patient activity level.Estimated longevity 7 years. Patient will follow up with MD in 6 months. Encouraged to do remote pacemaker checks every 3 months.

## 2013-07-26 NOTE — Assessment & Plan Note (Signed)
Rate control appears to be good. He is on appropriate anticoagulation therapy with warfarin. No recent bleeding complications and no history of stroke or TIA. Note a long-standing history of relatively low blood pressure. He requires a very small dose of beta blocker for rate control and has not had symptomatic hypotension.

## 2013-07-28 DIAGNOSIS — H6980 Other specified disorders of Eustachian tube, unspecified ear: Secondary | ICD-10-CM | POA: Diagnosis not present

## 2013-07-28 DIAGNOSIS — H908 Mixed conductive and sensorineural hearing loss, unspecified: Secondary | ICD-10-CM | POA: Diagnosis not present

## 2013-08-06 ENCOUNTER — Other Ambulatory Visit: Payer: Self-pay | Admitting: Family Medicine

## 2013-08-27 ENCOUNTER — Other Ambulatory Visit: Payer: Self-pay | Admitting: Pharmacist Clinician (PhC)/ Clinical Pharmacy Specialist

## 2013-08-27 NOTE — Telephone Encounter (Signed)
Spoke with patient, does not need refill from [facility] at this time, not sure why they sent request, will deny.  Pt past due for INR, will send message to [facility] at [facility] to schedule appt.

## 2013-10-01 ENCOUNTER — Other Ambulatory Visit: Payer: Self-pay | Admitting: *Deleted

## 2013-10-01 MED ORDER — ATENOLOL 25 MG PO TABS: 12.5000 mg | ORAL_TABLET | Freq: Every day | ORAL | Status: DC

## 2013-10-01 MED ORDER — DIGOXIN 250 MCG PO TABS: 0.2500 mg | ORAL_TABLET | Freq: Every day | ORAL | Status: DC

## 2013-10-01 MED ORDER — SIMVASTATIN 40 MG PO TABS: 60.0000 mg | ORAL_TABLET | Freq: Every day | ORAL | Status: DC

## 2013-10-01 NOTE — Telephone Encounter (Signed)
Rx was sent to pharmacy electronically. 

## 2013-10-02 ENCOUNTER — Telehealth: Payer: Self-pay | Admitting: Family Medicine

## 2013-10-02 ENCOUNTER — Other Ambulatory Visit: Payer: Self-pay | Admitting: Pharmacist Clinician (PhC)/ Clinical Pharmacy Specialist

## 2013-10-02 MED ORDER — WARFARIN SODIUM 5 MG PO TABS: ORAL_TABLET | ORAL | Status: DC

## 2013-10-02 MED ORDER — TAMSULOSIN HCL 0.4 MG PO CAPS: 0.4000 mg | ORAL_CAPSULE | Freq: Every day | ORAL | Status: DC

## 2013-10-02 NOTE — Telephone Encounter (Signed)
Medication refilled per protocol. 

## 2013-10-05 ENCOUNTER — Other Ambulatory Visit: Payer: Self-pay | Admitting: *Deleted

## 2013-10-05 NOTE — Telephone Encounter (Signed)
PA required for Digoxin and Simvastatin - sent through CoverMyMeds.

## 2013-10-08 ENCOUNTER — Ambulatory Visit (INDEPENDENT_AMBULATORY_CARE_PROVIDER_SITE_OTHER): Payer: Medicare Other | Admitting: *Deleted

## 2013-10-08 DIAGNOSIS — I4891 Unspecified atrial fibrillation: Secondary | ICD-10-CM | POA: Diagnosis not present

## 2013-10-08 DIAGNOSIS — Z7901 Long term (current) use of anticoagulants: Secondary | ICD-10-CM

## 2013-10-08 LAB — POCT INR: INR: 3.6

## 2013-10-12 ENCOUNTER — Telehealth: Payer: Self-pay | Admitting: *Deleted

## 2013-10-12 NOTE — Telephone Encounter (Signed)
Prior authorization approval for Simvastatin.

## 2013-10-12 NOTE — Telephone Encounter (Signed)
Received authorization for Digoxin 0.25mg  qd #90 for one year - until 09/23/14.

## 2013-10-26 ENCOUNTER — Telehealth: Payer: Self-pay | Admitting: Cardiovascular Disease

## 2013-10-26 ENCOUNTER — Other Ambulatory Visit: Payer: Self-pay | Admitting: Family Medicine

## 2013-10-26 MED ORDER — SIMVASTATIN 40 MG PO TABS: 60.0000 mg | ORAL_TABLET | Freq: Every day | ORAL | Status: DC

## 2013-10-26 MED ORDER — ATENOLOL 25 MG PO TABS: 12.5000 mg | ORAL_TABLET | Freq: Every day | ORAL | Status: DC

## 2013-10-26 MED ORDER — WARFARIN SODIUM 5 MG PO TABS: ORAL_TABLET | ORAL | Status: DC

## 2013-10-26 MED ORDER — DIGOXIN 250 MCG PO TABS: 0.2500 mg | ORAL_TABLET | Freq: Every day | ORAL | Status: DC

## 2013-10-26 MED ORDER — TAMSULOSIN HCL 0.4 MG PO CAPS: 0.4000 mg | ORAL_CAPSULE | Freq: Every day | ORAL | Status: DC

## 2013-10-26 NOTE — Telephone Encounter (Signed)
Atenolol, digoxin, simvastatin refilled.. Warfarin refill sent to [facility]

## 2013-10-26 NOTE — Telephone Encounter (Signed)
He need  You to fax all of his medicine in to [facility]-Fax:[phone] [facility] said they not received anything from [place]. He needs Atenolol,Digoxin,Simvastatin and Warfarin.

## 2013-11-04 ENCOUNTER — Ambulatory Visit (INDEPENDENT_AMBULATORY_CARE_PROVIDER_SITE_OTHER): Payer: Medicare Other | Admitting: *Deleted

## 2013-11-04 DIAGNOSIS — I4891 Unspecified atrial fibrillation: Secondary | ICD-10-CM

## 2013-11-04 DIAGNOSIS — Z5181 Encounter for therapeutic drug level monitoring: Secondary | ICD-10-CM | POA: Diagnosis not present

## 2013-11-04 DIAGNOSIS — Z7901 Long term (current) use of anticoagulants: Secondary | ICD-10-CM

## 2013-11-04 LAB — POCT INR: INR: 2.6

## 2013-11-30 ENCOUNTER — Telehealth: Payer: Self-pay | Admitting: *Deleted

## 2013-11-30 NOTE — Telephone Encounter (Signed)
Signed order faxed to [facility] lab for monthly INR.

## 2013-12-02 ENCOUNTER — Ambulatory Visit (INDEPENDENT_AMBULATORY_CARE_PROVIDER_SITE_OTHER): Payer: Medicare Other | Admitting: *Deleted

## 2013-12-02 DIAGNOSIS — Z5181 Encounter for therapeutic drug level monitoring: Secondary | ICD-10-CM | POA: Diagnosis not present

## 2013-12-02 DIAGNOSIS — I4891 Unspecified atrial fibrillation: Secondary | ICD-10-CM | POA: Diagnosis not present

## 2013-12-02 DIAGNOSIS — Z7901 Long term (current) use of anticoagulants: Secondary | ICD-10-CM

## 2013-12-02 LAB — POCT INR: INR: 2.9

## 2014-01-06 ENCOUNTER — Ambulatory Visit (INDEPENDENT_AMBULATORY_CARE_PROVIDER_SITE_OTHER): Payer: Medicare Other | Admitting: *Deleted

## 2014-01-06 DIAGNOSIS — I4891 Unspecified atrial fibrillation: Secondary | ICD-10-CM

## 2014-01-06 DIAGNOSIS — Z5181 Encounter for therapeutic drug level monitoring: Secondary | ICD-10-CM

## 2014-01-06 DIAGNOSIS — Z7901 Long term (current) use of anticoagulants: Secondary | ICD-10-CM | POA: Diagnosis not present

## 2014-01-06 LAB — POCT INR: INR: 2.3

## 2014-01-28 ENCOUNTER — Encounter: Payer: Self-pay | Admitting: Cardiovascular Disease

## 2014-01-28 ENCOUNTER — Ambulatory Visit (INDEPENDENT_AMBULATORY_CARE_PROVIDER_SITE_OTHER): Payer: Medicare Other | Admitting: Cardiovascular Disease

## 2014-01-28 VITALS — BP 102/69 | HR 74 | Ht 70.0 in | Wt 228.0 lb

## 2014-01-28 DIAGNOSIS — E7800 Pure hypercholesterolemia, unspecified: Secondary | ICD-10-CM | POA: Diagnosis not present

## 2014-01-28 DIAGNOSIS — Z7901 Long term (current) use of anticoagulants: Secondary | ICD-10-CM | POA: Diagnosis not present

## 2014-01-28 DIAGNOSIS — Z950 Presence of cardiac pacemaker: Secondary | ICD-10-CM

## 2014-01-28 DIAGNOSIS — E785 Hyperlipidemia, unspecified: Secondary | ICD-10-CM | POA: Diagnosis not present

## 2014-01-28 DIAGNOSIS — I4891 Unspecified atrial fibrillation: Secondary | ICD-10-CM

## 2014-01-28 NOTE — Patient Instructions (Signed)
Your physician wants you to follow-up in: 6 months You will receive a reminder letter in the mail two months in advance. If you don't receive a letter, please call our office to schedule the follow-up appointment.   Your physician recommends that you continue on your current medications as directed. Please refer to the Current Medication list given to you today.    Please get blood work fasting (BMET/LIPID/LIVER FUNCTION)    Thank you for choosing [facility] !

## 2014-01-28 NOTE — Progress Notes (Signed)
Patient ID: Kevin Vazquez, male   DOB: 10/02/1945, 68 y.o.   MRN: [id number]      SUBJECTIVE: The patient is a 68 year old male who I am meeting for the first time today. He has a history of atrial fibrillation and also has a pacemaker which was implanted for neurocardiogenic syncope, after the patient had a positive tilt table test. He has been doing well and stays very active. 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 He works 6 days a week. He seldom has palpitations and denies chest pain. If he stands up too quickly he may feel intermittent lightheadedness. He denies syncope. If he walks too quickly up a flight of stairs he may feel slightly short of breath.  No Known Allergies  Current Outpatient Prescriptions  Medication Sig Dispense Refill  . atenolol (TENORMIN) 25 MG tablet Take 0.5 tablets (12.5 mg total) by mouth daily.  45 tablet  2  . digoxin (LANOXIN) 0.25 MG tablet Take 1 tablet (0.25 mg total) by mouth daily.  90 tablet  2  . fish oil-omega-3 fatty acids 1000 MG capsule Take 1 g by mouth daily.      Marland Kitchen OVER THE COUNTER MEDICATION Super Beta Prostate. Take 1 tablet by mouth daily.      . psyllium (METAMUCIL) 58.6 % packet Take 1 packet by mouth daily.      . simvastatin (ZOCOR) 40 MG tablet Take 1.5 tablets (60 mg total) by mouth at bedtime.  135 tablet  2  . tamsulosin (FLOMAX) 0.4 MG CAPS capsule Take 1 capsule (0.4 mg total) by mouth daily.  90 capsule  4  . warfarin (COUMADIN) 5 MG tablet Take 1 & 1/2 -2 tablets by mouth daily or as directed  150 tablet  1   No current facility-administered medications for this visit.    Past Medical History  Diagnosis Date  . Sick sinus syndrome   . Atrial fibrillation, permanent     Past Surgical History  Procedure Laterality Date  . Pacemaker insertion  04/24/2010    Medtronic Adapta, model #ADSRO1, serial [id number]  . Left lower extremity venous doppler  02/07/2009    3.8x1.3x2.8cm  hypoechoic area in lateral L thigh. No evidence of DVT.  Marland Kitchen Cardiac catheterization  01/26/1997    Normal LV function. Normal coronary arteries.  . Cardiovascular stress test  05/02/2011    Normal perfusion scan demonstrating diaphragmatic artifact. No significant ischemia demonstrated   . Transthoracic echocardiogram  11/26/2012    EF 50-55%, LA-appendage was moderately dilated    History   Social History  . Marital Status: Married    Spouse Name: N/A    Number of Children: N/A  . Years of Education: N/A   Occupational History  . Not on file.   Social History Main Topics  . Smoking status: Never Smoker   . Smokeless tobacco: Former Systems developer    Types: Chew  . Alcohol Use: 3.5 oz/week    7 drink(s) per week  . Drug Use: No  . Sexual Activity: Not on file   Other Topics Concern  . Not on file   Social History Narrative  . No narrative on file    BP 102/69 Pulse 74    PHYSICAL EXAM General: NAD Neck: No JVD, no thyromegaly. Lungs: Clear to auscultation bilaterally with normal respiratory effort. CV: Nondisplaced PMI.  Regular rate and rhythm, normal S1/S2, no S3/S4, no murmur. No pretibial or periankle edema.  No  carotid bruit.  Normal pedal pulses.  Abdomen: Soft, nontender, no hepatosplenomegaly, no distention.  Neurologic: Alert and oriented x 3.  Psych: Normal affect. Extremities: No clubbing or cyanosis.   ECG: reviewed and available in electronic records.      ASSESSMENT AND PLAN: 1. Atrial fibrillation: Symptomatically stable. Currently in a regular rhythm. He is maintained on atenolol, digoxin, and warfarin. He is followed in the anticoagulation clinic. I will check a basic metabolic panel to assess renal function. 2. Pacemaker for neurocardiogenic syncope: Pacemaker most recently interrogated on 07/14/2013, and demonstrated normal device function with 6 high ventricular rates noted, the longest lasting 28 seconds.  3. Hyperlipidemia: I will check a lipid panel and  LFTs.   Dispo: f/u 6 months.  Kate Sable, M.D., F.A.C.C.

## 2014-01-29 LAB — LIPID PANEL
CHOL/HDL RATIO: 4.3 ratio
Cholesterol: 152 mg/dL (ref 0–200)
HDL: 35 mg/dL — ABNORMAL LOW (ref >39)
LDL CALC: 95 mg/dL (ref 0–99)
Triglycerides: 108 mg/dL (ref <150)
VLDL: 22 mg/dL (ref 0–40)

## 2014-01-29 LAB — BASIC METABOLIC PANEL
BUN: 19 mg/dL (ref 6–23)
CALCIUM: 8.8 mg/dL (ref 8.4–10.5)
CO2: 29 meq/L (ref 19–32)
Chloride: 103 mEq/L (ref 96–112)
Creat: 1.12 mg/dL (ref 0.50–1.35)
Glucose, Bld: 97 mg/dL (ref 70–99)
POTASSIUM: 4.7 meq/L (ref 3.5–5.3)
SODIUM: 136 meq/L (ref 135–145)

## 2014-01-29 LAB — HEPATIC FUNCTION PANEL
ALK PHOS: 45 U/L (ref 39–117)
ALT: 18 U/L (ref 0–53)
AST: 21 U/L (ref 0–37)
Albumin: 4 g/dL (ref 3.5–5.2)
BILIRUBIN INDIRECT: 0.6 mg/dL (ref 0.2–1.2)
Bilirubin, Direct: 0.1 mg/dL (ref 0.0–0.3)
TOTAL PROTEIN: 5.9 g/dL — AB (ref 6.0–8.3)
Total Bilirubin: 0.7 mg/dL (ref 0.2–1.2)

## 2014-02-03 ENCOUNTER — Encounter: Payer: Self-pay | Admitting: *Deleted

## 2014-02-17 ENCOUNTER — Ambulatory Visit (INDEPENDENT_AMBULATORY_CARE_PROVIDER_SITE_OTHER): Payer: Medicare Other | Admitting: *Deleted

## 2014-02-17 DIAGNOSIS — Z7901 Long term (current) use of anticoagulants: Secondary | ICD-10-CM | POA: Diagnosis not present

## 2014-02-17 DIAGNOSIS — Z5181 Encounter for therapeutic drug level monitoring: Secondary | ICD-10-CM | POA: Diagnosis not present

## 2014-02-17 DIAGNOSIS — I4891 Unspecified atrial fibrillation: Secondary | ICD-10-CM | POA: Diagnosis not present

## 2014-02-17 LAB — POCT INR: INR: 2.9

## 2014-02-22 ENCOUNTER — Encounter: Payer: Self-pay | Admitting: Internal Medicine

## 2014-02-22 ENCOUNTER — Ambulatory Visit (INDEPENDENT_AMBULATORY_CARE_PROVIDER_SITE_OTHER): Payer: Medicare Other | Admitting: Internal Medicine

## 2014-02-22 VITALS — BP 110/70 | HR 66 | Ht 70.0 in | Wt 228.0 lb

## 2014-02-22 DIAGNOSIS — I4891 Unspecified atrial fibrillation: Secondary | ICD-10-CM | POA: Diagnosis not present

## 2014-02-22 DIAGNOSIS — Z950 Presence of cardiac pacemaker: Secondary | ICD-10-CM

## 2014-02-22 LAB — MDC_IDC_ENUM_SESS_TYPE_INCLINIC
Battery Remaining Longevity: 74 mo
Brady Statistic RV Percent Paced: 64 %
Date Time Interrogation Session: 20150601101920
Lead Channel Impedance Value: 680 Ohm
Lead Channel Pacing Threshold Amplitude: 0.75 V
Lead Channel Pacing Threshold Pulse Width: 0.4 ms
Lead Channel Sensing Intrinsic Amplitude: 11.2 mV
Lead Channel Setting Pacing Pulse Width: 0.4 ms
MDC IDC MSMT BATTERY IMPEDANCE: 733 Ohm
MDC IDC MSMT BATTERY VOLTAGE: 2.79 V
MDC IDC MSMT LEADCHNL RA IMPEDANCE VALUE: 0 Ohm
MDC IDC SET LEADCHNL RV PACING AMPLITUDE: 2 V
MDC IDC SET LEADCHNL RV SENSING SENSITIVITY: 2.8 mV

## 2014-02-22 NOTE — Progress Notes (Signed)
HPI Mr Kevin Vazquez is referred today for ongoing evaluation and management of his PPM. He is a pleasant 68 yo man with chronic atrial fibrillation and symptomatic bradycardia, s/p PPM insertion. He was a patient of Dr. Larey Brick. He has seen Dr. Sallyanne Kuster. In the interim he has done well. He denies chest pain, sob, or syncope. No peripheral edema. No Known Allergies   Current Outpatient Prescriptions  Medication Sig Dispense Refill  . atenolol (TENORMIN) 25 MG tablet Take 0.5 tablets (12.5 mg total) by mouth daily.  45 tablet  2  . digoxin (LANOXIN) 0.25 MG tablet Take 1 tablet (0.25 mg total) by mouth daily.  90 tablet  2  . fish oil-omega-3 fatty acids 1000 MG capsule Take 1 g by mouth daily.      Marland Kitchen OVER THE COUNTER MEDICATION Super Beta Prostate. Take 1 tablet by mouth daily.      . psyllium (METAMUCIL) 58.6 % packet Take 1 packet by mouth daily.      . simvastatin (ZOCOR) 40 MG tablet Take 1.5 tablets (60 mg total) by mouth at bedtime.  135 tablet  2  . tamsulosin (FLOMAX) 0.4 MG CAPS capsule Take 1 capsule (0.4 mg total) by mouth daily.  90 capsule  4  . warfarin (COUMADIN) 5 MG tablet Take 1 & 1/2 -2 tablets by mouth daily or as directed  150 tablet  1   No current facility-administered medications for this visit.     Past Medical History  Diagnosis Date  . Sick sinus syndrome   . Atrial fibrillation, permanent     ROS:   All systems reviewed and negative except as noted in the HPI.   Past Surgical History  Procedure Laterality Date  . Pacemaker insertion  04/24/2010    Medtronic Adapta, model #ADSRO1, serial [id number]  . Left lower extremity venous doppler  02/07/2009    3.8x1.3x2.8cm hypoechoic area in lateral L thigh. No evidence of DVT.  Marland Kitchen Cardiac catheterization  01/26/1997    Normal LV function. Normal coronary arteries.  . Cardiovascular stress test  05/02/2011    Normal perfusion scan demonstrating diaphragmatic artifact. No significant ischemia demonstrated   .  Transthoracic echocardiogram  11/26/2012    EF 50-55%, LA-appendage was moderately dilated     No family history on file.   History   Social History  . Marital Status: Married    Spouse Name: N/A    Number of Children: N/A  . Years of Education: N/A   Occupational History  . Not on file.   Social History Main Topics  . Smoking status: Never Smoker   . Smokeless tobacco: Former Systems developer    Types: Chew  . Alcohol Use: 3.5 oz/week    7 drink(s) per week  . Drug Use: No  . Sexual Activity: Not on file   Other Topics Concern  . Not on file   Social History Narrative  . No narrative on file     BP 110/70  Pulse 66  Ht 5\' 10"  (1.778 m)  Wt 228 lb (103.42 kg)  BMI 32.71 kg/m2  SpO2 96%  Physical Exam:  Well appearing 68 yo man,NAD HEENT: Unremarkable Neck:  No JVD, no thyromegally Back:  No CVA tenderness Lungs:  Clear with no wheezes HEART:  Regular rate rhythm, no murmurs, no rubs, no clicks Abd:  soft, positive bowel sounds, no organomegally, no rebound, no guarding Ext:  2 plus pulses, no edema, no cyanosis, no clubbing Skin:  No rashes no nodules Neuro:  CN II through XII intact, motor grossly intact   DEVICE  Normal device function.  See PaceArt for details.   Assess/Plan:

## 2014-02-22 NOTE — Assessment & Plan Note (Signed)
His ventricular rate is well controlled. No change in medical therapy. 

## 2014-02-22 NOTE — Patient Instructions (Signed)
Your physician recommends that you schedule a follow-up appointment in: 6 months with Device clinic and 12 months with Dr Knox Saliva will receive a reminder letter two months in advance reminding you to call and schedule your appointment. If you don't receive this letter, please contact our office.

## 2014-02-22 NOTE — Assessment & Plan Note (Signed)
His medtronic single chamber pM is working normally. Will recheck in several months.

## 2014-03-24 ENCOUNTER — Ambulatory Visit (INDEPENDENT_AMBULATORY_CARE_PROVIDER_SITE_OTHER): Payer: Medicare Other | Admitting: *Deleted

## 2014-03-24 DIAGNOSIS — Z5181 Encounter for therapeutic drug level monitoring: Secondary | ICD-10-CM | POA: Diagnosis not present

## 2014-03-24 DIAGNOSIS — I4891 Unspecified atrial fibrillation: Secondary | ICD-10-CM

## 2014-03-24 DIAGNOSIS — Z7901 Long term (current) use of anticoagulants: Secondary | ICD-10-CM | POA: Diagnosis not present

## 2014-03-24 LAB — POCT INR: INR: 3.5

## 2014-04-19 ENCOUNTER — Ambulatory Visit: Payer: Self-pay

## 2014-04-19 ENCOUNTER — Encounter: Payer: Self-pay | Admitting: Family Medicine

## 2014-04-19 ENCOUNTER — Ambulatory Visit (INDEPENDENT_AMBULATORY_CARE_PROVIDER_SITE_OTHER): Payer: Medicare Other | Admitting: Family Medicine

## 2014-04-19 VITALS — BP 100/60 | HR 74 | Temp 97.5°F | Resp 18 | Ht 70.0 in | Wt 227.0 lb

## 2014-04-19 DIAGNOSIS — J209 Acute bronchitis, unspecified: Secondary | ICD-10-CM | POA: Diagnosis not present

## 2014-04-19 MED ORDER — LEVOFLOXACIN 500 MG PO TABS: 500.0000 mg | ORAL_TABLET | Freq: Every day | ORAL | Status: DC

## 2014-04-19 NOTE — Progress Notes (Signed)
Subjective:    Patient ID: Kevin Vazquez, male    DOB: 1946/08/24, 68 y.o.   MRN: [id number]  HPI Patient is a very pleasant 68 year old white gentleman who comes in today complaining of cough and chest congestion for over 2 weeks. He began with a severe sore throat and fever. And then settled in his chest and is not improved after 2 weeks. At times he feels like his airways or tying his having a difficult time breathing. He felt like his airways are constricted. He is also having a cough productive of clear sputum. He denies any green sputum or hemoptysis. He denies any audible wheezing. He continues to have subjective fevers. He denies any pleurisy. He denies any chest pain. He denies any chest pressure. He denies any sinus headache or sinus pressure. He denies a sore throat. He denies any rhinorrhea. Past Medical History  Diagnosis Date  . Sick sinus syndrome   . Atrial fibrillation, permanent    Past Surgical History  Procedure Laterality Date  . Pacemaker insertion  04/24/2010    Medtronic Adapta, model #ADSRO1, serial [id number]  . Left lower extremity venous doppler  02/07/2009    3.8x1.3x2.8cm hypoechoic area in lateral L thigh. No evidence of DVT.  Marland Kitchen Cardiac catheterization  01/26/1997    Normal LV function. Normal coronary arteries.  . Cardiovascular stress test  05/02/2011    Normal perfusion scan demonstrating diaphragmatic artifact. No significant ischemia demonstrated   . Transthoracic echocardiogram  11/26/2012    EF 50-55%, LA-appendage was moderately dilated   Current Outpatient Prescriptions on File Prior to Visit  Medication Sig Dispense Refill  . atenolol (TENORMIN) 25 MG tablet Take 0.5 tablets (12.5 mg total) by mouth daily.  45 tablet  2  . digoxin (LANOXIN) 0.25 MG tablet Take 1 tablet (0.25 mg total) by mouth daily.  90 tablet  2  . fish oil-omega-3 fatty acids 1000 MG capsule Take 1 g by mouth daily.      Marland Kitchen OVER THE COUNTER MEDICATION Super Beta Prostate. Take 1  tablet by mouth daily.      . psyllium (METAMUCIL) 58.6 % packet Take 1 packet by mouth daily.      . simvastatin (ZOCOR) 40 MG tablet Take 1.5 tablets (60 mg total) by mouth at bedtime.  135 tablet  2  . tamsulosin (FLOMAX) 0.4 MG CAPS capsule Take 1 capsule (0.4 mg total) by mouth daily.  90 capsule  4  . warfarin (COUMADIN) 5 MG tablet Take 1 & 1/2 -2 tablets by mouth daily or as directed  150 tablet  1   No current facility-administered medications on file prior to visit.   No Known Allergies History   Social History  . Marital Status: Married    Spouse Name: N/A    Number of Children: N/A  . Years of Education: N/A   Occupational History  . Not on file.   Social History Main Topics  . Smoking status: Never Smoker   . Smokeless tobacco: Former Systems developer    Types: Chew  . Alcohol Use: 3.5 oz/week    7 drink(s) per week  . Drug Use: No  . Sexual Activity: Not on file   Other Topics Concern  . Not on file   Social History Narrative  . No narrative on file      Review of Systems  All other systems reviewed and are negative.      Objective:   Physical Exam  Constitutional: He appears  well-developed and well-nourished.  HENT:  Right Ear: External ear normal.  Left Ear: External ear normal.  Nose: Nose normal.  Mouth/Throat: Oropharynx is clear and moist. No oropharyngeal exudate.  Eyes: Conjunctivae are normal.  Cardiovascular: Normal rate and normal heart sounds.   Pulmonary/Chest: Effort normal and breath sounds normal. No respiratory distress. He has no wheezes. He has no rales.          Assessment & Plan:  1. Acute bronchitis, unspecified organism Begin Levaquin 500 mg by mouth daily for 7 days. Use Mucinex over-the-counter as needed for chest congestion. Recheck in 1 week if no better or sooner if worse. Consider prednisone if necessary for reactive airway disease. Recheck INR in one week. - levofloxacin (LEVAQUIN) 500 MG tablet; Take 1 tablet (500 mg  total) by mouth daily.  Dispense: 7 tablet; Refill: 0

## 2014-04-21 ENCOUNTER — Ambulatory Visit (INDEPENDENT_AMBULATORY_CARE_PROVIDER_SITE_OTHER): Payer: Medicare Other | Admitting: *Deleted

## 2014-04-21 ENCOUNTER — Other Ambulatory Visit: Payer: Medicare Other

## 2014-04-21 DIAGNOSIS — Z5181 Encounter for therapeutic drug level monitoring: Secondary | ICD-10-CM | POA: Diagnosis not present

## 2014-04-21 DIAGNOSIS — Z125 Encounter for screening for malignant neoplasm of prostate: Secondary | ICD-10-CM

## 2014-04-21 DIAGNOSIS — Z7901 Long term (current) use of anticoagulants: Secondary | ICD-10-CM

## 2014-04-21 DIAGNOSIS — I10 Essential (primary) hypertension: Secondary | ICD-10-CM

## 2014-04-21 DIAGNOSIS — I4891 Unspecified atrial fibrillation: Secondary | ICD-10-CM

## 2014-04-21 DIAGNOSIS — E785 Hyperlipidemia, unspecified: Secondary | ICD-10-CM

## 2014-04-21 DIAGNOSIS — Z79899 Other long term (current) drug therapy: Secondary | ICD-10-CM

## 2014-04-21 LAB — CBC WITH DIFFERENTIAL/PLATELET
Basophils Absolute: 0 10*3/uL (ref 0.0–0.1)
Basophils Relative: 0 % (ref 0–1)
EOS ABS: 0.2 10*3/uL (ref 0.0–0.7)
Eosinophils Relative: 4 % (ref 0–5)
HCT: 42.8 % (ref 39.0–52.0)
Hemoglobin: 14.7 g/dL (ref 13.0–17.0)
LYMPHS ABS: 1.1 10*3/uL (ref 0.7–4.0)
Lymphocytes Relative: 18 % (ref 12–46)
MCH: 30.4 pg (ref 26.0–34.0)
MCHC: 34.3 g/dL (ref 30.0–36.0)
MCV: 88.6 fL (ref 78.0–100.0)
Monocytes Absolute: 0.6 10*3/uL (ref 0.1–1.0)
Monocytes Relative: 10 % (ref 3–12)
Neutro Abs: 4.1 10*3/uL (ref 1.7–7.7)
Neutrophils Relative %: 68 % (ref 43–77)
PLATELETS: 184 10*3/uL (ref 150–400)
RBC: 4.83 MIL/uL (ref 4.22–5.81)
RDW: 14.5 % (ref 11.5–15.5)
WBC: 6 10*3/uL (ref 4.0–10.5)

## 2014-04-21 LAB — COMPREHENSIVE METABOLIC PANEL
ALT: 20 U/L (ref 0–53)
AST: 22 U/L (ref 0–37)
Albumin: 4 g/dL (ref 3.5–5.2)
Alkaline Phosphatase: 47 U/L (ref 39–117)
BILIRUBIN TOTAL: 0.8 mg/dL (ref 0.2–1.2)
BUN: 17 mg/dL (ref 6–23)
CO2: 28 mEq/L (ref 19–32)
CREATININE: 1.24 mg/dL (ref 0.50–1.35)
Calcium: 9.1 mg/dL (ref 8.4–10.5)
Chloride: 104 mEq/L (ref 96–112)
Glucose, Bld: 91 mg/dL (ref 70–99)
Potassium: 4.8 mEq/L (ref 3.5–5.3)
Sodium: 140 mEq/L (ref 135–145)
Total Protein: 6.1 g/dL (ref 6.0–8.3)

## 2014-04-21 LAB — POCT INR: INR: 2.9

## 2014-04-21 LAB — LIPID PANEL
Cholesterol: 168 mg/dL (ref 0–200)
HDL: 39 mg/dL — ABNORMAL LOW (ref >39)
LDL Cholesterol: 100 mg/dL — ABNORMAL HIGH (ref 0–99)
TRIGLYCERIDES: 147 mg/dL (ref <150)
Total CHOL/HDL Ratio: 4.3 Ratio
VLDL: 29 mg/dL (ref 0–40)

## 2014-04-22 LAB — PSA, MEDICARE: PSA: 2.45 ng/mL (ref <=4.00)

## 2014-04-26 ENCOUNTER — Encounter: Payer: Self-pay | Admitting: Family Medicine

## 2014-04-26 ENCOUNTER — Ambulatory Visit (INDEPENDENT_AMBULATORY_CARE_PROVIDER_SITE_OTHER): Payer: Medicare Other | Admitting: Family Medicine

## 2014-04-26 ENCOUNTER — Ambulatory Visit (HOSPITAL_COMMUNITY)
Admission: RE | Admit: 2014-04-26 | Discharge: 2014-04-26 | Disposition: A | Discharge status: Home / Self Care | Payer: Medicare Other | Source: Ambulatory Visit | Attending: Family Medicine | Admitting: Family Medicine

## 2014-04-26 VITALS — BP 96/58 | HR 72 | Temp 97.7°F | Resp 16 | Ht 70.0 in | Wt 226.0 lb

## 2014-04-26 DIAGNOSIS — R05 Cough: Secondary | ICD-10-CM | POA: Diagnosis not present

## 2014-04-26 DIAGNOSIS — R059 Cough, unspecified: Secondary | ICD-10-CM | POA: Insufficient documentation

## 2014-04-26 DIAGNOSIS — Z0000 Encounter for general adult medical examination without abnormal findings: Secondary | ICD-10-CM | POA: Diagnosis not present

## 2014-04-26 NOTE — Progress Notes (Signed)
Subjective:    Patient ID: Kevin Vazquez, male    DOB: 08-02-1946, 68 y.o.   MRN: [id number]  HPI Patient is here today for complete physical exam. I last saw the patient for bronchitis. He completed Levaquin. He continues to have a cough. He continues to feel like he is breathing through narrowed airways.  He does believe he is slightly better. He is concerned because the cough is gone on now for several weeks. Otherwise he is doing well. He is overdue for colonoscopy. He is overdue for a digital rectal exam. Patient had Pneumovax 23, Tdap, and Zostavax.  He is overdue for Prevnar 13. His most recent labwork as listed below: Lab on 04/21/2014  Component Date Value Ref Range Status  . PSA 04/21/2014 2.45  <=4.00 ng/mL Final   Comment: Test Methodology: ECLIA PSA (Electrochemiluminescence Immunoassay)                                                     For PSA values from 2.5-4.0, particularly in younger men <60 years                          old, the AUA and NCCN suggest testing for % Free PSA (3515) and                          evaluation of the rate of increase in PSA (PSA velocity).  . WBC 04/21/2014 6.0  4.0 - 10.5 K/uL Final  . RBC 04/21/2014 4.83  4.22 - 5.81 MIL/uL Final  . Hemoglobin 04/21/2014 14.7  13.0 - 17.0 g/dL Final  . HCT 04/21/2014 42.8  39.0 - 52.0 % Final  . MCV 04/21/2014 88.6  78.0 - 100.0 fL Final  . MCH 04/21/2014 30.4  26.0 - 34.0 pg Final  . MCHC 04/21/2014 34.3  30.0 - 36.0 g/dL Final  . RDW 04/21/2014 14.5  11.5 - 15.5 % Final  . Platelets 04/21/2014 184  150 - 400 K/uL Final  . Neutrophils Relative % 04/21/2014 68  43 - 77 % Final  . Neutro Abs 04/21/2014 4.1  1.7 - 7.7 K/uL Final  . Lymphocytes Relative 04/21/2014 18  12 - 46 % Final  . Lymphs Abs 04/21/2014 1.1  0.7 - 4.0 K/uL Final  . Monocytes Relative 04/21/2014 10  3 - 12 % Final  . Monocytes Absolute 04/21/2014 0.6  0.1 - 1.0 K/uL Final  . Eosinophils Relative 04/21/2014 4  0 - 5 % Final  .  Eosinophils Absolute 04/21/2014 0.2  0.0 - 0.7 K/uL Final  . Basophils Relative 04/21/2014 0  0 - 1 % Final  . Basophils Absolute 04/21/2014 0.0  0.0 - 0.1 K/uL Final  . Smear Review 04/21/2014 Criteria for review not met   Final  . Sodium 04/21/2014 140  135 - 145 mEq/L Final  . Potassium 04/21/2014 4.8  3.5 - 5.3 mEq/L Final  . Chloride 04/21/2014 104  96 - 112 mEq/L Final  . CO2 04/21/2014 28  19 - 32 mEq/L Final  . Glucose, Bld 04/21/2014 91  70 - 99 mg/dL Final  . BUN 04/21/2014 17  6 - 23 mg/dL Final  . Creat 04/21/2014 1.24  0.50 - 1.35 mg/dL Final  . Total Bilirubin 04/21/2014  0.8  0.2 - 1.2 mg/dL Final  . Alkaline Phosphatase 04/21/2014 47  39 - 117 U/L Final  . AST 04/21/2014 22  0 - 37 U/L Final  . ALT 04/21/2014 20  0 - 53 U/L Final  . Total Protein 04/21/2014 6.1  6.0 - 8.3 g/dL Final  . Albumin 04/21/2014 4.0  3.5 - 5.2 g/dL Final  . Calcium 04/21/2014 9.1  8.4 - 10.5 mg/dL Final  . Cholesterol 04/21/2014 168  0 - 200 mg/dL Final   Comment: ATP III Classification:                                < 200        mg/dL        Desirable                               200 - 239     mg/dL        Borderline High                               >= 240        mg/dL        High                             . Triglycerides 04/21/2014 147  <150 mg/dL Final  . HDL 04/21/2014 39* >39 mg/dL Final  . Total CHOL/HDL Ratio 04/21/2014 4.3   Final  . VLDL 04/21/2014 29  0 - 40 mg/dL Final  . LDL Cholesterol 04/21/2014 100* 0 - 99 mg/dL Final   Comment:                            Total Cholesterol/HDL Ratio:CHD Risk                                                 Coronary Heart Disease Risk Table                                                                 Men       Women                                   1/2 Average Risk              3.4        3.3                                       Average Risk              5.0        4.4  2X Average Risk               9.6        7.1                                    3X Average Risk             23.4       11.0                          Use the calculated Patient Ratio above and the CHD Risk table                           to determine the patient's CHD Risk.                          ATP III Classification (LDL):                                < 100        mg/dL         Optimal                               100 - 129     mg/dL         Near or Above Optimal                               130 - 159     mg/dL         Borderline High                               160 - 189     mg/dL         High                                > 190        mg/dL         Very High                              Past Medical History  Diagnosis Date  . Sick sinus syndrome   . Atrial fibrillation, permanent    Past Surgical History  Procedure Laterality Date  . Pacemaker insertion  04/24/2010    Medtronic Adapta, model #ADSRO1, serial [id number]  . Left lower extremity venous doppler  02/07/2009    3.8x1.3x2.8cm hypoechoic area in lateral L thigh. No evidence of DVT.  Marland Kitchen Cardiac catheterization  01/26/1997    Normal LV function. Normal coronary arteries.  . Cardiovascular stress test  05/02/2011    Normal perfusion scan demonstrating diaphragmatic artifact. No significant ischemia demonstrated   . Transthoracic echocardiogram  11/26/2012    EF 50-55%, LA-appendage was moderately dilated   Current Outpatient Prescriptions on File Prior to Visit  Medication Sig Dispense Refill  . atenolol (TENORMIN) 25 MG tablet Take 0.5 tablets (12.5 mg total)  by mouth daily.  45 tablet  2  . digoxin (LANOXIN) 0.25 MG tablet Take 1 tablet (0.25 mg total) by mouth daily.  90 tablet  2  . fish oil-omega-3 fatty acids 1000 MG capsule Take 1 g by mouth daily.      Marland Kitchen OVER THE COUNTER MEDICATION Super Beta Prostate. Take 1 tablet by mouth daily.      . psyllium (METAMUCIL) 58.6 % packet Take 1 packet by mouth daily.      . simvastatin (ZOCOR) 40 MG tablet  Take 1.5 tablets (60 mg total) by mouth at bedtime.  135 tablet  2  . tamsulosin (FLOMAX) 0.4 MG CAPS capsule Take 1 capsule (0.4 mg total) by mouth daily.  90 capsule  4  . warfarin (COUMADIN) 5 MG tablet Take 1 & 1/2 -2 tablets by mouth daily or as directed  150 tablet  1   No current facility-administered medications on file prior to visit.   No Known Allergies History   Social History  . Marital Status: Married    Spouse Name: N/A    Number of Children: N/A  . Years of Education: N/A   Occupational History  . Not on file.   Social History Main Topics  . Smoking status: Never Smoker   . Smokeless tobacco: Former Systems developer    Types: Chew  . Alcohol Use: 3.5 oz/week    7 drink(s) per week  . Drug Use: No  . Sexual Activity: Not on file   Other Topics Concern  . Not on file   Social History Narrative  . No narrative on file   No family history on file.    Review of Systems  All other systems reviewed and are negative.      Objective:   Physical Exam  Vitals reviewed. Constitutional: He is oriented to person, place, and time. He appears well-developed and well-nourished. No distress.  HENT:  Head: Normocephalic and atraumatic.  Right Ear: External ear normal.  Left Ear: External ear normal.  Nose: Nose normal.  Mouth/Throat: Oropharynx is clear and moist. No oropharyngeal exudate.  Eyes: Conjunctivae and EOM are normal. Pupils are equal, round, and reactive to light. Right eye exhibits no discharge. Left eye exhibits no discharge. No scleral icterus.  Neck: Normal range of motion. Neck supple. No JVD present. No tracheal deviation present. No thyromegaly present.  Cardiovascular: Normal rate, regular rhythm, normal heart sounds and intact distal pulses.  Exam reveals no gallop and no friction rub.   No murmur heard. Pulmonary/Chest: Effort normal and breath sounds normal. No stridor. No respiratory distress. He has no wheezes. He has no rales. He exhibits no  tenderness.  Abdominal: Soft. Bowel sounds are normal. He exhibits no distension and no mass. There is no tenderness. There is no rebound and no guarding.  Genitourinary: Rectum normal and prostate normal. Guaiac negative stool. No penile tenderness.  Musculoskeletal: Normal range of motion. He exhibits no edema and no tenderness.  Lymphadenopathy:    He has no cervical adenopathy.  Neurological: He is alert and oriented to person, place, and time. He has normal reflexes. He displays normal reflexes. No cranial nerve deficit. He exhibits normal muscle tone. Coordination normal.  Skin: Skin is warm. No rash noted. He is not diaphoretic. No erythema. No pallor.  Psychiatric: He has a normal mood and affect. His behavior is normal. Judgment and thought content normal.          Assessment & Plan:  1. Cough Due  to the chronicity of the cough I will obtain a chest x-ray. However, clinically the patient is improving. I anticipate that with further tincture of time he will completely improve. - DG Chest 2 View; Future  2. Routine general medical examination at a health care facility Physical exam is completely normal. Lab work is completely normal. I scheduled the patient for colonoscopy. I did recommend that he receive Prevnar 13 however until the patient feels better. He like to return this fall and get vaccination along with a flu shot at the same time.  Preventative care is up-to-date. Regular anticipate guidance is provided. - Ambulatory referral to Gastroenterology

## 2014-04-28 ENCOUNTER — Encounter (INDEPENDENT_AMBULATORY_CARE_PROVIDER_SITE_OTHER): Payer: Self-pay | Admitting: *Deleted

## 2014-04-28 DIAGNOSIS — L570 Actinic keratosis: Secondary | ICD-10-CM | POA: Diagnosis not present

## 2014-04-28 DIAGNOSIS — L820 Inflamed seborrheic keratosis: Secondary | ICD-10-CM | POA: Diagnosis not present

## 2014-04-28 DIAGNOSIS — D485 Neoplasm of uncertain behavior of skin: Secondary | ICD-10-CM | POA: Diagnosis not present

## 2014-05-04 ENCOUNTER — Other Ambulatory Visit: Payer: Self-pay | Admitting: Cardiovascular Disease

## 2014-05-19 ENCOUNTER — Ambulatory Visit (INDEPENDENT_AMBULATORY_CARE_PROVIDER_SITE_OTHER): Payer: Medicare Other | Admitting: *Deleted

## 2014-05-19 DIAGNOSIS — Z7901 Long term (current) use of anticoagulants: Secondary | ICD-10-CM

## 2014-05-19 DIAGNOSIS — Z5181 Encounter for therapeutic drug level monitoring: Secondary | ICD-10-CM | POA: Diagnosis not present

## 2014-05-19 DIAGNOSIS — I4891 Unspecified atrial fibrillation: Secondary | ICD-10-CM

## 2014-05-19 LAB — POCT INR: INR: 3.3

## 2014-05-28 ENCOUNTER — Encounter: Payer: Self-pay | Admitting: Gastroenterology

## 2014-06-16 ENCOUNTER — Ambulatory Visit (INDEPENDENT_AMBULATORY_CARE_PROVIDER_SITE_OTHER): Payer: Medicare Other | Admitting: *Deleted

## 2014-06-16 DIAGNOSIS — Z7901 Long term (current) use of anticoagulants: Secondary | ICD-10-CM

## 2014-06-16 DIAGNOSIS — Z5181 Encounter for therapeutic drug level monitoring: Secondary | ICD-10-CM

## 2014-06-16 DIAGNOSIS — I4891 Unspecified atrial fibrillation: Secondary | ICD-10-CM

## 2014-06-16 LAB — POCT INR: INR: 2.3

## 2014-06-28 DIAGNOSIS — H6520 Chronic serous otitis media, unspecified ear: Secondary | ICD-10-CM | POA: Diagnosis not present

## 2014-06-28 DIAGNOSIS — H908 Mixed conductive and sensorineural hearing loss, unspecified: Secondary | ICD-10-CM | POA: Diagnosis not present

## 2014-06-28 DIAGNOSIS — H6120 Impacted cerumen, unspecified ear: Secondary | ICD-10-CM | POA: Diagnosis not present

## 2014-07-14 ENCOUNTER — Ambulatory Visit (INDEPENDENT_AMBULATORY_CARE_PROVIDER_SITE_OTHER): Payer: Medicare Other | Admitting: *Deleted

## 2014-07-14 DIAGNOSIS — Z7901 Long term (current) use of anticoagulants: Secondary | ICD-10-CM

## 2014-07-14 DIAGNOSIS — I4891 Unspecified atrial fibrillation: Secondary | ICD-10-CM | POA: Diagnosis not present

## 2014-07-14 DIAGNOSIS — Z5181 Encounter for therapeutic drug level monitoring: Secondary | ICD-10-CM

## 2014-07-14 LAB — POCT INR: INR: 2.6

## 2014-07-21 ENCOUNTER — Other Ambulatory Visit: Payer: Self-pay | Admitting: Cardiovascular Disease

## 2014-07-30 ENCOUNTER — Ambulatory Visit (INDEPENDENT_AMBULATORY_CARE_PROVIDER_SITE_OTHER): Payer: Medicare Other | Admitting: Gastroenterology

## 2014-07-30 ENCOUNTER — Encounter: Payer: Self-pay | Admitting: Gastroenterology

## 2014-07-30 VITALS — BP 120/82 | HR 70 | Ht 70.0 in | Wt 234.2 lb

## 2014-07-30 DIAGNOSIS — I480 Paroxysmal atrial fibrillation: Secondary | ICD-10-CM

## 2014-07-30 DIAGNOSIS — Z86010 Personal history of colonic polyps: Secondary | ICD-10-CM | POA: Diagnosis not present

## 2014-07-30 MED ORDER — NA SULFATE-K SULFATE-MG SULF 17.5-3.13-1.6 GM/177ML PO SOLN: 1.0000 | Freq: Once | ORAL | Status: DC

## 2014-07-30 NOTE — Patient Instructions (Signed)
You have been scheduled for a colonoscopy. Please follow written instructions given to you at your visit today.  Please pick up your prep kit at the pharmacy within the next 1-3 days. If you use inhalers (even only as needed), please bring them with you on the day of your procedure. Your physician has requested that you go to www.startemmi.com and enter the access code given to you at your visit today. This web site gives a general overview about your procedure. However, you should still follow specific instructions given to you by our office regarding your preparation for the procedure.  You will be contaced by our office prior to your procedure for directions on holding your Coumadin/Warfarin.  If you do not hear from our office 1 week prior to your scheduled procedure, please call [phone] to discuss.

## 2014-07-30 NOTE — Progress Notes (Signed)
_                                                                                                                History of Present Illness:  Kevin Vazquez a 68 year old white male referred for screening colonoscopy.  He is on Coumadin for atrial fibrillation.  He believes that he underwent colonoscopy 10 years ago where polyps were removed.  He has no GI complaints including change of bowel habits, abdominal pain or rectal bleeding.   Past Medical History  Diagnosis Date  . Sick sinus syndrome   . Atrial fibrillation, permanent    Past Surgical History  Procedure Laterality Date  . Pacemaker insertion  04/24/2010    Medtronic Adapta, model #ADSRO1, serial [id number]  . Left lower extremity venous doppler  02/07/2009    3.8x1.3x2.8cm hypoechoic area in lateral L thigh. No evidence of DVT.  Marland Kitchen Cardiac catheterization  01/26/1997    Normal LV function. Normal coronary arteries.  . Cardiovascular stress test  05/02/2011    Normal perfusion scan demonstrating diaphragmatic artifact. No significant ischemia demonstrated   . Transthoracic echocardiogram  11/26/2012    EF 50-55%, LA-appendage was moderately dilated   family history includes Heart disease in an other family member. Current Outpatient Prescriptions  Medication Sig Dispense Refill  . atenolol (TENORMIN) 25 MG tablet TAKE 1/2 TABLET DAILY 45 tablet 2  . digoxin (LANOXIN) 0.25 MG tablet TAKE 1 TABLET DAILY 90 tablet 2  . fish oil-omega-3 fatty acids 1000 MG capsule Take 1 g by mouth daily.    Marland Kitchen OVER THE COUNTER MEDICATION Super Beta Prostate. Take 1 tablet by mouth daily.    . psyllium (METAMUCIL) 58.6 % packet Take 1 packet by mouth daily.    . simvastatin (ZOCOR) 40 MG tablet TAKE 1 AND 1/2 TABLETS AT BEDTIME 135 tablet 2  . tamsulosin (FLOMAX) 0.4 MG CAPS capsule Take 1 capsule (0.4 mg total) by mouth daily. 90 capsule 4  . warfarin (COUMADIN) 5 MG tablet TAKE 1 & 1/2  TO 2 TABLETS DAILY OR AS DIRECTED 180 tablet 1    No current facility-administered medications for this visit.   Allergies as of 07/30/2014  . (No Known Allergies)    reports that he has never smoked. He has quit using smokeless tobacco. His smokeless tobacco use included Chew. He reports that he drinks about 3.5 oz of alcohol per week. He reports that he does not use illicit drugs.   Review of Systems: Pertinent positive and negative review of systems were noted in the above HPI section. All other review of systems were otherwise negative.  Vital signs were reviewed in today's medical record Physical Exam: General: Well developed , well nourished, no acute distress Skin: anicteric Head: Normocephalic and atraumatic Eyes:  sclerae anicteric, EOMI Ears: Normal auditory acuity Mouth: No deformity or lesions Neck: Supple, no masses or thyromegaly Lungs: Clear throughout to auscultation Heart: Regular rate and rhythm; no murmurs, rubs or bruits Abdomen: Soft, non tender and non distended. No  masses, hepatosplenomegaly or hernias noted. Normal Bowel sounds Rectal:deferred Musculoskeletal: Symmetrical with no gross deformities  Skin: No lesions on visible extremities Pulses:  Normal pulses noted Extremities: No clubbing, cyanosis, edema or deformities noted Neurological: Alert oriented x 4, grossly nonfocal Cervical Nodes:  No significant cervical adenopathy Inguinal Nodes: No significant inguinal adenopathy Psychological:  Alert and cooperative. Normal mood and affect  See Assessment and Plan under Problem List

## 2014-07-30 NOTE — Assessment & Plan Note (Signed)
Patient believes: Polyps were removed at last colonoscopy 10 years ago.  Plan on repeating exam.  We'll also attempt to obtain prior records.  The risk of holding anticoagulation therapy or antiplatelet medications was discussed including the increased risk for thromboembolic disease that may include DVT, pulmonary emboli and stroke. The patient understands this risk and is willing to proceed with temporally holding the medication provided that this is approved by her PCP or cardiologist.

## 2014-08-09 ENCOUNTER — Telehealth: Payer: Self-pay | Admitting: *Deleted

## 2014-08-09 ENCOUNTER — Telehealth: Payer: Self-pay | Admitting: Internal Medicine

## 2014-08-09 MED ORDER — NA SULFATE-K SULFATE-MG SULF 17.5-3.13-1.6 GM/177ML PO SOLN: 1.0000 | Freq: Once | ORAL | Status: DC

## 2014-08-09 NOTE — Telephone Encounter (Signed)
Explained to patient ok to hold coumadin 5 days before procedure  Had to resend prep to [facility] in [place]

## 2014-08-09 NOTE — Telephone Encounter (Signed)
Sent OK from Dr Crissie Sickles to be scanned in.

## 2014-08-09 NOTE — Telephone Encounter (Signed)
Received request from Nurse fax box:   To: [facility] Fax number: [phone]

## 2014-08-10 ENCOUNTER — Telehealth: Payer: Self-pay | Admitting: Gastroenterology

## 2014-08-10 NOTE — Telephone Encounter (Signed)
L/M for patient to come pick up free sample [employer] will not cover it even with mail order sill same insurance co

## 2014-08-11 ENCOUNTER — Ambulatory Visit (INDEPENDENT_AMBULATORY_CARE_PROVIDER_SITE_OTHER): Payer: Medicare Other | Admitting: *Deleted

## 2014-08-11 DIAGNOSIS — Z7901 Long term (current) use of anticoagulants: Secondary | ICD-10-CM | POA: Diagnosis not present

## 2014-08-11 DIAGNOSIS — Z5181 Encounter for therapeutic drug level monitoring: Secondary | ICD-10-CM | POA: Diagnosis not present

## 2014-08-11 DIAGNOSIS — I4891 Unspecified atrial fibrillation: Secondary | ICD-10-CM

## 2014-08-11 LAB — POCT INR: INR: 2.6

## 2014-09-02 ENCOUNTER — Ambulatory Visit (INDEPENDENT_AMBULATORY_CARE_PROVIDER_SITE_OTHER): Payer: Medicare Other | Admitting: *Deleted

## 2014-09-02 DIAGNOSIS — I482 Chronic atrial fibrillation, unspecified: Secondary | ICD-10-CM

## 2014-09-02 LAB — MDC_IDC_ENUM_SESS_TYPE_INCLINIC
Battery Remaining Longevity: 67 mo
Brady Statistic RV Percent Paced: 64 %
Lead Channel Impedance Value: 0 Ohm
Lead Channel Impedance Value: 674 Ohm
Lead Channel Pacing Threshold Amplitude: 0.75 V
Lead Channel Sensing Intrinsic Amplitude: 11.2 mV
Lead Channel Setting Pacing Amplitude: 2 V
Lead Channel Setting Pacing Pulse Width: 0.4 ms
Lead Channel Setting Sensing Sensitivity: 4 mV
MDC IDC MSMT BATTERY IMPEDANCE: 884 Ohm
MDC IDC MSMT BATTERY VOLTAGE: 2.78 V
MDC IDC MSMT LEADCHNL RV PACING THRESHOLD PULSEWIDTH: 0.4 ms
MDC IDC SESS DTM: 20151210144812

## 2014-09-02 NOTE — Progress Notes (Signed)
PPM check in office. 

## 2014-09-20 ENCOUNTER — Encounter: Payer: Self-pay | Admitting: Internal Medicine

## 2014-09-22 ENCOUNTER — Ambulatory Visit (INDEPENDENT_AMBULATORY_CARE_PROVIDER_SITE_OTHER): Payer: Medicare Other | Admitting: *Deleted

## 2014-09-22 DIAGNOSIS — I4891 Unspecified atrial fibrillation: Secondary | ICD-10-CM | POA: Diagnosis not present

## 2014-09-22 DIAGNOSIS — Z7901 Long term (current) use of anticoagulants: Secondary | ICD-10-CM

## 2014-09-22 DIAGNOSIS — Z5181 Encounter for therapeutic drug level monitoring: Secondary | ICD-10-CM

## 2014-09-22 LAB — POCT INR: INR: 2.4

## 2014-09-24 HISTORY — PX: COLONOSCOPY: SHX174

## 2014-09-28 ENCOUNTER — Ambulatory Visit: Payer: Medicare Other | Admitting: Cardiovascular Disease

## 2014-09-30 ENCOUNTER — Ambulatory Visit (INDEPENDENT_AMBULATORY_CARE_PROVIDER_SITE_OTHER): Payer: Medicare Other | Admitting: Otolaryngology

## 2014-09-30 DIAGNOSIS — J330 Polyp of nasal cavity: Secondary | ICD-10-CM

## 2014-09-30 DIAGNOSIS — H6983 Other specified disorders of Eustachian tube, bilateral: Secondary | ICD-10-CM

## 2014-09-30 DIAGNOSIS — J310 Chronic rhinitis: Secondary | ICD-10-CM

## 2014-09-30 DIAGNOSIS — H900 Conductive hearing loss, bilateral: Secondary | ICD-10-CM | POA: Diagnosis not present

## 2014-10-13 ENCOUNTER — Encounter: Payer: Self-pay | Admitting: Gastroenterology

## 2014-10-13 ENCOUNTER — Ambulatory Visit (AMBULATORY_SURGERY_CENTER): Payer: Medicare Other | Admitting: Gastroenterology

## 2014-10-13 VITALS — BP 111/72 | HR 70 | Temp 95.3°F | Resp 20 | Ht 70.0 in | Wt 234.0 lb

## 2014-10-13 DIAGNOSIS — Z1211 Encounter for screening for malignant neoplasm of colon: Secondary | ICD-10-CM | POA: Diagnosis not present

## 2014-10-13 DIAGNOSIS — Z86010 Personal history of colonic polyps: Secondary | ICD-10-CM | POA: Diagnosis not present

## 2014-10-13 DIAGNOSIS — I4891 Unspecified atrial fibrillation: Secondary | ICD-10-CM | POA: Diagnosis not present

## 2014-10-13 DIAGNOSIS — D123 Benign neoplasm of transverse colon: Secondary | ICD-10-CM

## 2014-10-13 DIAGNOSIS — K635 Polyp of colon: Secondary | ICD-10-CM

## 2014-10-13 DIAGNOSIS — D122 Benign neoplasm of ascending colon: Secondary | ICD-10-CM

## 2014-10-13 MED ORDER — SODIUM CHLORIDE 0.9 % IV SOLN: 500.0000 mL | INTRAVENOUS | Status: DC

## 2014-10-13 NOTE — Progress Notes (Signed)
Called to room to assist during endoscopic procedure.  Patient ID and intended procedure confirmed with present staff. Received instructions for my participation in the procedure from the performing physician.  

## 2014-10-13 NOTE — Op Note (Signed)
[facility]  [address] [place], [id number]   COLONOSCOPY PROCEDURE REPORT  PATIENT: Kevin Vazquez, Kevin Vazquez  MR#: [id number] BIRTHDATE: [birth year] , 68  yrs. old GENDER: male ENDOSCOPIST: Inda Castle, MD REFERRED IR:CVELFY Dennard Schaumann, M.D. PROCEDURE DATE:  10/13/2014 PROCEDURE:   Colonoscopy with snare polypectomy and Colonoscopy with cold biopsy polypectomy First Screening Colonoscopy - Avg.  risk and is 50 yrs.  old or older - No.  Prior Negative Screening - Now for repeat screening. N/A  History of Adenoma - Now for follow-up colonoscopy & has been > or = to 3 yrs.  Yes hx of adenoma.  Has been 3 or more years since last colonoscopy.  Polyps Removed Today? Yes. ASA CLASS:   Class II INDICATIONS:high risk personal history of colonic polyps. MEDICATIONS: Monitored anesthesia care and Propofol 250 mg IV  DESCRIPTION OF PROCEDURE:   After the risks benefits and alternatives of the procedure were thoroughly explained, informed consent was obtained.  The digital rectal exam revealed no abnormalities of the rectum.   The LB [id number]  endoscope was introduced through the anus and advanced to the ileum. No adverse events experienced.   The quality of the prep was excellent using Suprep  The instrument was then slowly withdrawn as the colon was fully examined.      COLON FINDINGS: A sessile polyp measuring 8 mm in size was found in the ascending colon.  A polypectomy was performed with a cold snare.  The resection was complete, the polyp tissue was completely retrieved and sent to histology.   A sessile polyp measuring 3 mm in size was found at the splenic flexure.  A polypectomy was performed with a cold snare.  The resection was complete, the polyp tissue was completely retrieved and sent to histology.   Two sessile polyps measuring 2 mm in size were found in the ascending colon.  A polypectomy was performed with cold forceps.  Retroflexed views revealed no  abnormalities. The time to cecum=4 minutes 22 seconds.  Withdrawal time=13 minutes 37 seconds.  The scope was withdrawn and the procedure completed. COMPLICATIONS: There were no immediate complications.  ENDOSCOPIC IMPRESSION: 1.   Sessile polyp was found in the ascending colon; polypectomy was performed with a cold snare 2.   Sessile polyp was found at the splenic flexure; polypectomy was performed with a cold snare 3.   Two sessile polyps were found in the ascending colon; polypectomy was performed with cold forceps  RECOMMENDATIONS: If the polyp(s) removed today are proven to be adenomatous (pre-cancerous) polyps, you will need a colonoscopy in 3 years. Otherwise you should continue to follow colorectal cancer screening guidelines for "routine risk" patients with a colonoscopy in 10 years.  You will receive a letter within 1-2 weeks with the results of your biopsy as well as final recommendations.  Please call my office if you have not received a letter after 3 weeks.  eSigned:  Inda Castle, MD 10/13/2014 9:18 AM   cc:   PATIENT NAME:  Kevin Vazquez, Kevin Vazquez MR#: [id number]

## 2014-10-13 NOTE — Progress Notes (Signed)
A/ox3 pleased with MAC, report to Jane RN 

## 2014-10-13 NOTE — Patient Instructions (Addendum)
Resume Coumadin in a.m. Polyp information given.   YOU HAD AN ENDOSCOPIC PROCEDURE TODAY AT [facility]: Refer to the procedure report that was given to you for any specific questions about what was found during the examination.  If the procedure report does not answer your questions, please call your gastroenterologist to clarify.  If you requested that your care partner not be given the details of your procedure findings, then the procedure report has been included in a sealed envelope for you to review at your convenience later.  YOU SHOULD EXPECT: Some feelings of bloating in the abdomen. Passage of more gas than usual.  Walking can help get rid of the air that was put into your GI tract during the procedure and reduce the bloating. If you had a lower endoscopy (such as a colonoscopy or flexible sigmoidoscopy) you may notice spotting of blood in your stool or on the toilet paper. If you underwent a bowel prep for your procedure, then you may not have a normal bowel movement for a few days.  DIET: Your first meal following the procedure should be a light meal and then it is ok to progress to your normal diet.  A half-sandwich or bowl of soup is an example of a good first meal.  Heavy or fried foods are harder to digest and may make you feel nauseous or bloated.  Likewise meals heavy in dairy and vegetables can cause extra gas to form and this can also increase the bloating.  Drink plenty of fluids but you should avoid alcoholic beverages for 24 hours.  ACTIVITY: Your care partner should take you home directly after the procedure.  You should plan to take it easy, moving slowly for the rest of the day.  You can resume normal activity the day after the procedure however you should NOT DRIVE or use heavy machinery for 24 hours (because of the sedation medicines used during the test).    SYMPTOMS TO REPORT IMMEDIATELY: A gastroenterologist can be reached at any hour.  During normal  business hours, 8:30 AM to 5:00 PM Monday through Friday, call [phone].  After hours and on weekends, please call the GI answering service at [phone] who will take a message and have the physician on call contact you.   Following lower endoscopy (colonoscopy or flexible sigmoidoscopy):  Excessive amounts of blood in the stool  Significant tenderness or worsening of abdominal pains  Swelling of the abdomen that is new, acute  Fever of 100F or higher   FOLLOW UP: If any biopsies were taken you will be contacted by phone or by letter within the next 1-3 weeks.  Call your gastroenterologist if you have not heard about the biopsies in 3 weeks.  Our staff will call the home number listed on your records the next business day following your procedure to check on you and address any questions or concerns that you may have at that time regarding the information given to you following your procedure. This is a courtesy call and so if there is no answer at the home number and we have not heard from you through the emergency physician on call, we will assume that you have returned to your regular daily activities without incident.  SIGNATURES/CONFIDENTIALITY: You and/or your care partner have signed paperwork which will be entered into your electronic medical record.  These signatures attest to the fact that that the information above on your After Visit Summary has been reviewed and  is understood.  Full responsibility of the confidentiality of this discharge information lies with you and/or your care-partner.

## 2014-10-14 ENCOUNTER — Telehealth: Payer: Self-pay | Admitting: *Deleted

## 2014-10-14 NOTE — Telephone Encounter (Signed)
Opened in error

## 2014-10-14 NOTE — Telephone Encounter (Signed)
  Follow up Call-  Call back number 10/13/2014  Post procedure Call Back phone  # [phone]  Permission to leave phone message Yes     Patient questions:  Do you have a fever, pain , or abdominal swelling? No. Pain Score  0 *  Have you tolerated food without any problems? Yes.    Have you been able to return to your normal activities? Yes.    Do you have any questions about your discharge instructions: Diet   No. Medications  No. Follow up visit  No.  Do you have questions or concerns about your Care? No.  Actions: * If pain score is 4 or above: No action needed, pain <4.

## 2014-10-21 ENCOUNTER — Encounter: Payer: Self-pay | Admitting: Gastroenterology

## 2014-10-25 ENCOUNTER — Encounter: Payer: Self-pay | Admitting: *Deleted

## 2014-10-26 ENCOUNTER — Other Ambulatory Visit: Payer: Self-pay | Admitting: Family Medicine

## 2014-10-27 ENCOUNTER — Ambulatory Visit (INDEPENDENT_AMBULATORY_CARE_PROVIDER_SITE_OTHER): Payer: Medicare Other | Admitting: *Deleted

## 2014-10-27 DIAGNOSIS — Z5181 Encounter for therapeutic drug level monitoring: Secondary | ICD-10-CM

## 2014-10-27 DIAGNOSIS — I4891 Unspecified atrial fibrillation: Secondary | ICD-10-CM | POA: Diagnosis not present

## 2014-10-27 DIAGNOSIS — I480 Paroxysmal atrial fibrillation: Secondary | ICD-10-CM

## 2014-10-27 DIAGNOSIS — Z7901 Long term (current) use of anticoagulants: Secondary | ICD-10-CM

## 2014-10-27 LAB — POCT INR: INR: 2.9

## 2014-10-28 ENCOUNTER — Ambulatory Visit (INDEPENDENT_AMBULATORY_CARE_PROVIDER_SITE_OTHER): Payer: Medicare Other | Admitting: Otolaryngology

## 2014-10-28 DIAGNOSIS — H9011 Conductive hearing loss, unilateral, right ear, with unrestricted hearing on the contralateral side: Secondary | ICD-10-CM | POA: Diagnosis not present

## 2014-10-28 DIAGNOSIS — J330 Polyp of nasal cavity: Secondary | ICD-10-CM | POA: Diagnosis not present

## 2014-10-28 DIAGNOSIS — J310 Chronic rhinitis: Secondary | ICD-10-CM | POA: Diagnosis not present

## 2014-10-28 DIAGNOSIS — H6983 Other specified disorders of Eustachian tube, bilateral: Secondary | ICD-10-CM

## 2014-12-08 ENCOUNTER — Ambulatory Visit (INDEPENDENT_AMBULATORY_CARE_PROVIDER_SITE_OTHER): Payer: Medicare Other | Admitting: *Deleted

## 2014-12-08 DIAGNOSIS — Z7901 Long term (current) use of anticoagulants: Secondary | ICD-10-CM | POA: Diagnosis not present

## 2014-12-08 DIAGNOSIS — I480 Paroxysmal atrial fibrillation: Secondary | ICD-10-CM | POA: Diagnosis not present

## 2014-12-08 DIAGNOSIS — Z5181 Encounter for therapeutic drug level monitoring: Secondary | ICD-10-CM | POA: Diagnosis not present

## 2014-12-08 DIAGNOSIS — I4891 Unspecified atrial fibrillation: Secondary | ICD-10-CM | POA: Diagnosis not present

## 2014-12-08 LAB — POCT INR: INR: 3

## 2014-12-25 ENCOUNTER — Other Ambulatory Visit: Payer: Self-pay | Admitting: Internal Medicine

## 2015-01-18 DIAGNOSIS — L570 Actinic keratosis: Secondary | ICD-10-CM | POA: Diagnosis not present

## 2015-01-18 DIAGNOSIS — L309 Dermatitis, unspecified: Secondary | ICD-10-CM | POA: Diagnosis not present

## 2015-01-19 ENCOUNTER — Ambulatory Visit (INDEPENDENT_AMBULATORY_CARE_PROVIDER_SITE_OTHER): Payer: Medicare Other | Admitting: *Deleted

## 2015-01-19 DIAGNOSIS — Z5181 Encounter for therapeutic drug level monitoring: Secondary | ICD-10-CM

## 2015-01-19 DIAGNOSIS — I480 Paroxysmal atrial fibrillation: Secondary | ICD-10-CM

## 2015-01-19 DIAGNOSIS — Z7901 Long term (current) use of anticoagulants: Secondary | ICD-10-CM | POA: Diagnosis not present

## 2015-01-19 DIAGNOSIS — I4891 Unspecified atrial fibrillation: Secondary | ICD-10-CM

## 2015-01-19 LAB — POCT INR: INR: 2.3

## 2015-02-07 DIAGNOSIS — M79671 Pain in right foot: Secondary | ICD-10-CM | POA: Diagnosis not present

## 2015-02-07 DIAGNOSIS — M7741 Metatarsalgia, right foot: Secondary | ICD-10-CM | POA: Diagnosis not present

## 2015-02-07 DIAGNOSIS — M7751 Other enthesopathy of right foot: Secondary | ICD-10-CM | POA: Diagnosis not present

## 2015-02-07 DIAGNOSIS — M7750 Other enthesopathy of unspecified foot: Secondary | ICD-10-CM | POA: Diagnosis not present

## 2015-02-07 DIAGNOSIS — M2041 Other hammer toe(s) (acquired), right foot: Secondary | ICD-10-CM | POA: Diagnosis not present

## 2015-02-10 DIAGNOSIS — X32XXXA Exposure to sunlight, initial encounter: Secondary | ICD-10-CM | POA: Diagnosis not present

## 2015-02-10 DIAGNOSIS — Z08 Encounter for follow-up examination after completed treatment for malignant neoplasm: Secondary | ICD-10-CM | POA: Diagnosis not present

## 2015-02-10 DIAGNOSIS — Z85820 Personal history of malignant melanoma of skin: Secondary | ICD-10-CM | POA: Diagnosis not present

## 2015-02-10 DIAGNOSIS — Z1283 Encounter for screening for malignant neoplasm of skin: Secondary | ICD-10-CM | POA: Diagnosis not present

## 2015-02-10 DIAGNOSIS — L570 Actinic keratosis: Secondary | ICD-10-CM | POA: Diagnosis not present

## 2015-02-25 ENCOUNTER — Ambulatory Visit (INDEPENDENT_AMBULATORY_CARE_PROVIDER_SITE_OTHER): Payer: Medicare Other | Admitting: Internal Medicine

## 2015-02-25 ENCOUNTER — Encounter: Payer: Self-pay | Admitting: Internal Medicine

## 2015-02-25 VITALS — BP 110/62 | HR 57 | Ht 70.0 in | Wt 234.0 lb

## 2015-02-25 DIAGNOSIS — I482 Chronic atrial fibrillation, unspecified: Secondary | ICD-10-CM

## 2015-02-25 DIAGNOSIS — Z7901 Long term (current) use of anticoagulants: Secondary | ICD-10-CM

## 2015-02-25 DIAGNOSIS — Z950 Presence of cardiac pacemaker: Secondary | ICD-10-CM

## 2015-02-25 LAB — CUP PACEART INCLINIC DEVICE CHECK
Battery Impedance: 1115 Ohm
Battery Voltage: 2.78 V
Lead Channel Impedance Value: 0 Ohm
Lead Channel Impedance Value: 710 Ohm
Lead Channel Pacing Threshold Amplitude: 0.75 V
Lead Channel Setting Pacing Pulse Width: 0.4 ms
Lead Channel Setting Sensing Sensitivity: 4 mV
MDC IDC MSMT BATTERY REMAINING LONGEVITY: 60 mo
MDC IDC MSMT LEADCHNL RV PACING THRESHOLD PULSEWIDTH: 0.4 ms
MDC IDC MSMT LEADCHNL RV SENSING INTR AMPL: 11.2 mV
MDC IDC SESS DTM: 20160603095321
MDC IDC SET LEADCHNL RV PACING AMPLITUDE: 2 V
MDC IDC STAT BRADY RV PERCENT PACED: 47 %

## 2015-02-25 NOTE — Assessment & Plan Note (Signed)
He is chronically in atrial fib. His rate is controlled. He is tolerating his anti-coagulation.

## 2015-02-25 NOTE — Progress Notes (Signed)
      HPI Mr Kevin Vazquez returns today for ongoing evaluation and management of his PPM. He is a pleasant 69 yo man with chronic atrial fibrillation and symptomatic bradycardia, s/p PPM insertion. He was a patient of Dr. Larey Brick. He has seen Dr. Sallyanne Kuster. In the interim he has done well. He denies chest pain, sob, or syncope. No peripheral edema. 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 He has self reduced his medications.  No Known Allergies   Current Outpatient Prescriptions  Medication Sig Dispense Refill  . atenolol (TENORMIN) 25 MG tablet TAKE 1/2 TABLET DAILY 45 tablet 2  . digoxin (LANOXIN) 0.25 MG tablet TAKE 1 TABLET DAILY 90 tablet 2  . OVER THE COUNTER MEDICATION Super Beta Prostate. Take 1 tablet by mouth daily.    . psyllium (METAMUCIL) 58.6 % packet Take 1 packet by mouth daily.    . simvastatin (ZOCOR) 40 MG tablet TAKE 1 AND 1/2 TABLETS AT BEDTIME 135 tablet 2  . tamsulosin (FLOMAX) 0.4 MG CAPS capsule TAKE 1 CAPSULE EVERY DAY 90 capsule 3  . warfarin (COUMADIN) 5 MG tablet TAKE 1 AND 1/2 TABLETS TO 2 TABLETS DAILY OR AS DIRECTED 180 tablet 3   No current facility-administered medications for this visit.     Past Medical History  Diagnosis Date  . Sick sinus syndrome   . Atrial fibrillation, permanent     ROS:   All systems reviewed and negative except as noted in the HPI.   Past Surgical History  Procedure Laterality Date  . Pacemaker insertion  04/24/2010    Medtronic Adapta, model #ADSRO1, serial [id number]  . Left lower extremity venous doppler  02/07/2009    3.8x1.3x2.8cm hypoechoic area in lateral L thigh. No evidence of DVT.  Marland Kitchen Cardiac catheterization  01/26/1997    Normal LV function. Normal coronary arteries.  . Cardiovascular stress test  05/02/2011    Normal perfusion scan demonstrating diaphragmatic artifact. No significant ischemia demonstrated   . Transthoracic echocardiogram  11/26/2012    EF 50-55%, LA-appendage was  moderately dilated     Family History  Problem Relation Age of Onset  . Heart disease       History   Social History  . Marital Status: Married    Spouse Name: N/A  . Number of Children: N/A  . 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 Smoking status: Never Smoker   . Smokeless tobacco: Former Systems developer    Types: Chew  . Alcohol Use: 3.5 oz/week    7 drink(s) per week  . Drug Use: No  . Sexual Activity: Not on file   Other Topics Concern  . Not on file   Social History Narrative     There were no vitals taken for this visit.  Physical Exam:  Well appearing 69 yo man,NAD HEENT: Unremarkable Neck:  6 cm JVD, no thyromegally Back:  No CVA tenderness Lungs:  Clear with no wheezes HEART:  Regular rate rhythm, no murmurs, no rubs, no clicks Abd:  soft, positive bowel sounds, no organomegally, no rebound, no guarding Ext:  2 plus pulses, no edema, no cyanosis, no clubbing Skin:  No rashes no nodules Neuro:  CN II through XII intact, motor grossly intact   DEVICE  Normal device function.  See PaceArt for details.   Assess/Plan:

## 2015-02-25 NOTE — Assessment & Plan Note (Signed)
His medtronic single chamber PPM is working normally. Will recheck in several months.  

## 2015-02-25 NOTE — Patient Instructions (Signed)
Your physician wants you to follow-up in: 1 year with Dr. Lovena Le. You will receive a reminder letter in the mail two months in advance. If you don't receive a letter, please call our office to schedule the follow-up appointment.  Your physician recommends that you schedule a follow-up appointment in 6 months with the [place] physician recommends that you continue on your current medications as directed. Please refer to the Current Medication list given to you today.  Thank you for choosing [facility]!

## 2015-02-25 NOTE — Assessment & Plan Note (Signed)
He is tolerating coumadin for stroke prevention in atrial fibrillation. No change in meds.

## 2015-03-02 ENCOUNTER — Ambulatory Visit (INDEPENDENT_AMBULATORY_CARE_PROVIDER_SITE_OTHER): Payer: Medicare Other | Admitting: *Deleted

## 2015-03-02 DIAGNOSIS — I4891 Unspecified atrial fibrillation: Secondary | ICD-10-CM | POA: Diagnosis not present

## 2015-03-02 DIAGNOSIS — Z5181 Encounter for therapeutic drug level monitoring: Secondary | ICD-10-CM

## 2015-03-02 DIAGNOSIS — Z7901 Long term (current) use of anticoagulants: Secondary | ICD-10-CM

## 2015-03-02 LAB — POCT INR: INR: 2.7

## 2015-03-04 ENCOUNTER — Encounter: Payer: Self-pay | Admitting: Internal Medicine

## 2015-04-13 ENCOUNTER — Ambulatory Visit (INDEPENDENT_AMBULATORY_CARE_PROVIDER_SITE_OTHER): Payer: Medicare Other | Admitting: *Deleted

## 2015-04-13 DIAGNOSIS — Z7901 Long term (current) use of anticoagulants: Secondary | ICD-10-CM

## 2015-04-13 DIAGNOSIS — I480 Paroxysmal atrial fibrillation: Secondary | ICD-10-CM | POA: Diagnosis not present

## 2015-04-13 DIAGNOSIS — I4891 Unspecified atrial fibrillation: Secondary | ICD-10-CM | POA: Diagnosis not present

## 2015-04-13 DIAGNOSIS — Z5181 Encounter for therapeutic drug level monitoring: Secondary | ICD-10-CM

## 2015-04-13 LAB — POCT INR: INR: 2.4

## 2015-04-15 ENCOUNTER — Other Ambulatory Visit: Payer: Self-pay | Admitting: Internal Medicine

## 2015-05-23 ENCOUNTER — Ambulatory Visit (INDEPENDENT_AMBULATORY_CARE_PROVIDER_SITE_OTHER): Payer: Medicare Other | Admitting: Pharmacist

## 2015-05-23 DIAGNOSIS — I4891 Unspecified atrial fibrillation: Secondary | ICD-10-CM | POA: Diagnosis not present

## 2015-05-23 DIAGNOSIS — Z7901 Long term (current) use of anticoagulants: Secondary | ICD-10-CM | POA: Diagnosis not present

## 2015-05-23 DIAGNOSIS — Z5181 Encounter for therapeutic drug level monitoring: Secondary | ICD-10-CM

## 2015-05-23 LAB — POCT INR: INR: 2.9

## 2015-07-04 ENCOUNTER — Ambulatory Visit (INDEPENDENT_AMBULATORY_CARE_PROVIDER_SITE_OTHER): Payer: Medicare Other | Admitting: *Deleted

## 2015-07-04 DIAGNOSIS — Z7901 Long term (current) use of anticoagulants: Secondary | ICD-10-CM | POA: Diagnosis not present

## 2015-07-04 DIAGNOSIS — Z23 Encounter for immunization: Secondary | ICD-10-CM

## 2015-07-04 DIAGNOSIS — I4891 Unspecified atrial fibrillation: Secondary | ICD-10-CM

## 2015-07-04 DIAGNOSIS — Z5181 Encounter for therapeutic drug level monitoring: Secondary | ICD-10-CM | POA: Diagnosis not present

## 2015-07-04 LAB — POCT INR: INR: 2.3

## 2015-08-15 ENCOUNTER — Ambulatory Visit (INDEPENDENT_AMBULATORY_CARE_PROVIDER_SITE_OTHER): Payer: Medicare Other | Admitting: *Deleted

## 2015-08-15 DIAGNOSIS — I4891 Unspecified atrial fibrillation: Secondary | ICD-10-CM

## 2015-08-15 DIAGNOSIS — Z5181 Encounter for therapeutic drug level monitoring: Secondary | ICD-10-CM

## 2015-08-15 DIAGNOSIS — Z7901 Long term (current) use of anticoagulants: Secondary | ICD-10-CM | POA: Diagnosis not present

## 2015-08-15 LAB — POCT INR: INR: 2.3

## 2015-08-22 ENCOUNTER — Ambulatory Visit (INDEPENDENT_AMBULATORY_CARE_PROVIDER_SITE_OTHER): Payer: Medicare Other | Admitting: *Deleted

## 2015-08-22 ENCOUNTER — Encounter: Payer: Self-pay | Admitting: Internal Medicine

## 2015-08-22 DIAGNOSIS — I482 Chronic atrial fibrillation, unspecified: Secondary | ICD-10-CM

## 2015-08-22 LAB — CUP PACEART INCLINIC DEVICE CHECK
Battery Impedance: 1298 Ohm
Battery Voltage: 2.77 V
Brady Statistic RV Percent Paced: 45.1 %
Implantable Lead Location: 753860
Implantable Lead Model: 5092
Lead Channel Impedance Value: 738 Ohm
Lead Channel Setting Pacing Amplitude: 2.5 V
Lead Channel Setting Sensing Sensitivity: 4 mV
MDC IDC LEAD IMPLANT DT: 20020722
MDC IDC MSMT BATTERY REMAINING LONGEVITY: 54 mo
MDC IDC MSMT LEADCHNL RV PACING THRESHOLD AMPLITUDE: 0.5 V
MDC IDC MSMT LEADCHNL RV PACING THRESHOLD PULSEWIDTH: 0.4 ms
MDC IDC MSMT LEADCHNL RV SENSING INTR AMPL: 11.2 mV
MDC IDC SESS DTM: 20161128130826
MDC IDC SET LEADCHNL RV PACING PULSEWIDTH: 0.4 ms

## 2015-08-22 NOTE — Progress Notes (Signed)
Pacemaker check in clinic. Normal device function. Threshold, sensing, and impedance consistent with previous measurements. Device programmed to maximize longevity. Permanent AF + warfarin. (40) high ventricular rates noted---max dur. 53 sec, Max Avg V 202---AF/RVR per EGMs. Device programmed at appropriate safety margins. Histogram distribution appropriate for patient activity level. Device programmed to optimize intrinsic conduction. Estimated longevity 4.5 years. Patient will follow up with GT/R in 6 months.

## 2015-09-28 ENCOUNTER — Ambulatory Visit (INDEPENDENT_AMBULATORY_CARE_PROVIDER_SITE_OTHER): Payer: Medicare Other | Admitting: *Deleted

## 2015-09-28 DIAGNOSIS — I4891 Unspecified atrial fibrillation: Secondary | ICD-10-CM

## 2015-09-28 DIAGNOSIS — Z7901 Long term (current) use of anticoagulants: Secondary | ICD-10-CM | POA: Diagnosis not present

## 2015-09-28 DIAGNOSIS — Z5181 Encounter for therapeutic drug level monitoring: Secondary | ICD-10-CM | POA: Diagnosis not present

## 2015-09-28 LAB — POCT INR: INR: 2

## 2015-10-07 ENCOUNTER — Encounter: Payer: Self-pay | Admitting: Family Medicine

## 2015-10-07 ENCOUNTER — Ambulatory Visit (INDEPENDENT_AMBULATORY_CARE_PROVIDER_SITE_OTHER): Payer: Medicare Other | Admitting: Family Medicine

## 2015-10-07 VITALS — BP 104/76 | HR 80 | Temp 97.5°F | Resp 18 | Wt 232.0 lb

## 2015-10-07 DIAGNOSIS — N410 Acute prostatitis: Secondary | ICD-10-CM

## 2015-10-07 DIAGNOSIS — R3989 Other symptoms and signs involving the genitourinary system: Secondary | ICD-10-CM | POA: Diagnosis not present

## 2015-10-07 DIAGNOSIS — R39198 Other difficulties with micturition: Secondary | ICD-10-CM | POA: Diagnosis not present

## 2015-10-07 LAB — URINALYSIS, ROUTINE W REFLEX MICROSCOPIC
BILIRUBIN URINE: NEGATIVE
GLUCOSE, UA: NEGATIVE
Ketones, ur: NEGATIVE
LEUKOCYTES UA: NEGATIVE
Nitrite: NEGATIVE
PH: 6 (ref 5.0–8.0)
Protein, ur: NEGATIVE
Specific Gravity, Urine: 1.02 (ref 1.001–1.035)

## 2015-10-07 LAB — URINALYSIS, MICROSCOPIC ONLY
CASTS: NONE SEEN [LPF]
Crystals: NONE SEEN [HPF]
YEAST: NONE SEEN [HPF]

## 2015-10-07 MED ORDER — CIPROFLOXACIN HCL 500 MG PO TABS: 500.0000 mg | ORAL_TABLET | Freq: Two times a day (BID) | ORAL | Status: DC

## 2015-10-07 NOTE — Progress Notes (Signed)
Patient ID: Kevin Vazquez, male   DOB: 1946/07/15, 70 y.o.   MRN: [id number]     Subjective:    Patient ID: Kevin Vazquez, male    DOB: 08-20-1946, 70 y.o.   MRN: [id number]  Patient presents for c/o difficulty with urination  Pt here with difficulty urinating dysuria for the past  Few weeks. , History of BPH currently on Flomax 0.4mg  at bedtime.   His symptoms initially started with urinary frequency now he has nocturia as well as some dribbling. He feels like he cannot empty his bladder completely over the past couple days he will go back to the restroom every 1-2 hours and he has burning along with pressure his lower abdomen. He denies any back pain no fever no vomiting no change in bowel movements. He denies any blood in the urine.   Review Of Systems:  GEN- denies fatigue, fever, weight loss,weakness, recent illness HEENT- denies eye drainage, change in vision, nasal discharge, CVS- denies chest pain, palpitations RESP- denies SOB, cough, wheeze ABD- denies N/V, change in stools, abd pain GU- + dysuria, hematuria, dribbling, incontinence Neuro- denies headache, dizziness, syncope, seizure activity       Objective:    BP 104/76 mmHg  Pulse 80  Temp(Src) 97.5 F (36.4 C) (Oral)  Resp 18  Wt 232 lb (105.235 kg) GEN- NAD, alert and oriented x3 CVS- RRR, no murmur RESP-CTAB ABD-NABS,soft,TTP suprapubic region, no rebound, no guarding, no CVA tenderness Rectum- prostate a little boggy, enlarged, no nodules, pain with exam, no gross blood          Assessment & Plan:      Problem List Items Addressed This Visit    None    Visit Diagnoses    Acute prostatitis    -  Primary    Concern for prostatitis, UA fairly unremarkable, culture sent, start Cipro x 10 days, he is on coumadin needs coumadin check in 3 weeks     Relevant Orders    Urinalysis, Routine w reflex microscopic (not at [facility]) (Completed)    Urine culture       Note: This dictation was prepared with Dragon  dictation along with smaller phrase technology. Any transcriptional errors that result from this process are unintentional.

## 2015-10-07 NOTE — Patient Instructions (Signed)
Continue current medication Start antibiotics Coumadin check in 3 weeks  F/U as needed

## 2015-10-08 LAB — URINE CULTURE
Colony Count: NO GROWTH
ORGANISM ID, BACTERIA: NO GROWTH

## 2015-10-11 ENCOUNTER — Telehealth: Payer: Self-pay | Admitting: Cardiovascular Disease

## 2015-10-11 ENCOUNTER — Other Ambulatory Visit: Payer: Self-pay | Admitting: Family Medicine

## 2015-10-11 NOTE — Telephone Encounter (Signed)
Refill appropriate and filled per protocol. 

## 2015-10-11 NOTE — Telephone Encounter (Signed)
Will discuss with dr Bronson Ing this week 10/13/15

## 2015-10-11 NOTE — Telephone Encounter (Signed)
Pt is needing an Rx for his Digoxin sent to [facility] in [place] and he's also wondering if there is a generic due to it being $79, and he would also like to know if the dosage could be changed due to it being a Tier 4 and ins will pay if it's a Tier 2

## 2015-10-13 ENCOUNTER — Ambulatory Visit (INDEPENDENT_AMBULATORY_CARE_PROVIDER_SITE_OTHER): Payer: Medicare Other | Admitting: Cardiovascular Disease

## 2015-10-13 ENCOUNTER — Encounter: Payer: Self-pay | Admitting: Cardiovascular Disease

## 2015-10-13 VITALS — BP 100/62 | HR 87 | Ht 70.0 in | Wt 233.0 lb

## 2015-10-13 DIAGNOSIS — Z950 Presence of cardiac pacemaker: Secondary | ICD-10-CM | POA: Diagnosis not present

## 2015-10-13 DIAGNOSIS — E785 Hyperlipidemia, unspecified: Secondary | ICD-10-CM | POA: Diagnosis not present

## 2015-10-13 DIAGNOSIS — I482 Chronic atrial fibrillation, unspecified: Secondary | ICD-10-CM

## 2015-10-13 DIAGNOSIS — Z7901 Long term (current) use of anticoagulants: Secondary | ICD-10-CM | POA: Diagnosis not present

## 2015-10-13 MED ORDER — DIGOXIN 125 MCG PO TABS: 0.2500 mg | ORAL_TABLET | Freq: Every day | ORAL | Status: DC

## 2015-10-13 NOTE — Progress Notes (Signed)
Patient ID: Kevin Vazquez, male   DOB: October 27, 1945, 70 y.o.   MRN: [id number]      SUBJECTIVE: The patient returns for follow-up of chronic atrial fibrillation. He has a pacemaker for symptomatic bradycardia. He also follows with Dr. Lovena Le.  Denies chest pain and shortness of breath as well as leg swelling. Occasionally has palpitations which are not bothersome. Wants to switch to generic digoxin. Had muscle aches with higher doses of simvastatin and reduced himself to 20 mg. He also stopped atenolol on his own.  Says he does not see his PCP regularly and had been getting primary care through his previous cardiologist.   Review of Systems: As per "subjective", otherwise negative.  No Known Allergies  Current Outpatient Prescriptions  Medication Sig Dispense Refill  . simvastatin (ZOCOR) 20 MG tablet Take 20 mg by mouth daily.    . ciprofloxacin (CIPRO) 500 MG tablet Take 1 tablet (500 mg total) by mouth 2 (two) times daily. 20 tablet 0  . digoxin (LANOXIN) 0.25 MG tablet TAKE 1 TABLET EVERY DAY 90 tablet 3  . OVER THE COUNTER MEDICATION Super Beta Prostate. Take 1 tablet by mouth daily.    . psyllium (METAMUCIL) 58.6 % packet Take 1 packet by mouth daily.    . tamsulosin (FLOMAX) 0.4 MG CAPS capsule TAKE 1 CAPSULE EVERY DAY 90 capsule 3  . warfarin (COUMADIN) 5 MG tablet TAKE 1 AND 1/2 TABLETS TO 2 TABLETS DAILY OR AS DIRECTED 180 tablet 3   No current facility-administered medications for this visit.    Past Medical History  Diagnosis Date  . Sick sinus syndrome ([place])   . Atrial fibrillation, permanent ([place])   . BPH (benign prostatic hyperplasia)     Past Surgical History  Procedure Laterality Date  . Pacemaker insertion  04/24/2010    Medtronic Adapta, model #ADSRO1, serial [id number]  . Left lower extremity venous doppler  02/07/2009    3.8x1.3x2.8cm hypoechoic area in lateral L thigh. No evidence of DVT.  Marland Kitchen Cardiac catheterization  01/26/1997    Normal LV function. Normal  coronary arteries.  . Cardiovascular stress test  05/02/2011    Normal perfusion scan demonstrating diaphragmatic artifact. No significant ischemia demonstrated   . Transthoracic echocardiogram  11/26/2012    EF 50-55%, LA-appendage was moderately dilated    Social History   Social History  . Marital Status: Married    Spouse Name: N/A  . Number of Children: N/A  . 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 Smoking status: Never Smoker   . Smokeless tobacco: Former Systems developer    Types: Chew  . Alcohol Use: 3.5 oz/week    7 Standard drinks or equivalent per week  . Drug Use: No  . Sexual Activity: Not on file   Other Topics Concern  . Not on file   Social History Narrative     Filed Vitals:   10/13/15 1021  BP: 100/62  Pulse: 87  Height: 5\' 10"  (1.778 m)  Weight: 233 lb (105.688 kg)  SpO2: 94%    PHYSICAL EXAM General: NAD HEENT: Normal. Neck: No JVD, no thyromegaly. Lungs: Clear to auscultation bilaterally with normal respiratory effort. CV: Nondisplaced PMI.  Regular rate and mostly regular rhythm, normal S1/S2, no S3/S4, no murmur. No pretibial or periankle edema.  No carotid bruit.    Abdomen: Soft, nontender, obese.  Neurologic: Alert and oriented x 3.  Psych: Normal affect. Skin:  Normal. Musculoskeletal: Normal range of motion, no gross deformities. Extremities: No clubbing or cyanosis.   ECG: Most recent ECG reviewed.      ASSESSMENT AND PLAN: 1. Chronic atrial fibrillation: Symptomatically stable. He is maintained on digoxin and warfarin, and is no longer on atenolol. He is followed in the anticoagulation clinic.   2. 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 Follows with Dr. Lovena Le.  3. Hyperlipidemia: Continue  simvastatin 20 mg. Lipid management deferred to PCP.  Dispo: f/u with Dr. Lovena Le given that his only problems are atrial fibrillation and has a pacemaker.   Kate Sable, M.D., F.A.C.C.

## 2015-10-13 NOTE — Patient Instructions (Signed)
Your physician wants you to follow-up in: [place] will receive a reminder letter in the mail two months in advance. If you don't receive a letter, please call our office to schedule the follow-up appointment.   .Your physician recommends that you continue on your current medications as directed. Please refer to the Current Medication list given to you today.    If you need a refill on your cardiac medications before your next appointment, please call your pharmacy.      Thank you for choosing [facility] !

## 2015-10-17 ENCOUNTER — Telehealth: Payer: Self-pay

## 2015-10-17 MED ORDER — DILTIAZEM HCL 30 MG PO TABS: 30.0000 mg | ORAL_TABLET | Freq: Four times a day (QID) | ORAL | Status: DC

## 2015-10-17 NOTE — Telephone Encounter (Signed)
Pt made aware

## 2015-10-17 NOTE — Telephone Encounter (Signed)
Will try diltiazem 30 mg q am.

## 2015-10-17 NOTE — Telephone Encounter (Signed)
Pt's insurance took Digoxin and generic lanoxin  into tier 4 which is now $84/monthly for his 0.25 mg daily dose ,which they used to charge him $10 for. [facility] charges the same price-I  checked.He wants to switch to something different other than Dig nos as he cannot afford

## 2015-10-31 ENCOUNTER — Ambulatory Visit (INDEPENDENT_AMBULATORY_CARE_PROVIDER_SITE_OTHER): Payer: Medicare Other | Admitting: *Deleted

## 2015-10-31 DIAGNOSIS — I4891 Unspecified atrial fibrillation: Secondary | ICD-10-CM | POA: Diagnosis not present

## 2015-10-31 DIAGNOSIS — Z5181 Encounter for therapeutic drug level monitoring: Secondary | ICD-10-CM

## 2015-10-31 DIAGNOSIS — Z7901 Long term (current) use of anticoagulants: Secondary | ICD-10-CM

## 2015-10-31 LAB — POCT INR: INR: 2.4

## 2015-12-14 ENCOUNTER — Ambulatory Visit (INDEPENDENT_AMBULATORY_CARE_PROVIDER_SITE_OTHER): Payer: Medicare Other | Admitting: *Deleted

## 2015-12-14 DIAGNOSIS — I4891 Unspecified atrial fibrillation: Secondary | ICD-10-CM

## 2015-12-14 DIAGNOSIS — Z5181 Encounter for therapeutic drug level monitoring: Secondary | ICD-10-CM

## 2015-12-14 DIAGNOSIS — Z7901 Long term (current) use of anticoagulants: Secondary | ICD-10-CM | POA: Diagnosis not present

## 2015-12-14 LAB — POCT INR: INR: 2.1

## 2016-01-02 ENCOUNTER — Other Ambulatory Visit: Payer: Self-pay | Admitting: Internal Medicine

## 2016-01-02 ENCOUNTER — Other Ambulatory Visit: Payer: Self-pay | Admitting: Cardiovascular Disease

## 2016-01-02 MED ORDER — DILTIAZEM HCL 30 MG PO TABS: 30.0000 mg | ORAL_TABLET | Freq: Every day | ORAL | Status: DC

## 2016-01-02 NOTE — Telephone Encounter (Signed)
Digoxin changed to diltiazem 30 mg daily

## 2016-01-11 ENCOUNTER — Other Ambulatory Visit: Payer: Self-pay | Admitting: *Deleted

## 2016-01-11 MED ORDER — ROSUVASTATIN CALCIUM 5 MG PO TABS: 5.0000 mg | ORAL_TABLET | Freq: Every day | ORAL | Status: DC

## 2016-01-17 ENCOUNTER — Other Ambulatory Visit: Payer: Self-pay | Admitting: Physician Assistant

## 2016-01-17 DIAGNOSIS — D485 Neoplasm of uncertain behavior of skin: Secondary | ICD-10-CM | POA: Diagnosis not present

## 2016-01-17 DIAGNOSIS — L570 Actinic keratosis: Secondary | ICD-10-CM | POA: Diagnosis not present

## 2016-01-17 DIAGNOSIS — L820 Inflamed seborrheic keratosis: Secondary | ICD-10-CM | POA: Diagnosis not present

## 2016-01-25 ENCOUNTER — Ambulatory Visit (INDEPENDENT_AMBULATORY_CARE_PROVIDER_SITE_OTHER): Payer: Medicare Other | Admitting: *Deleted

## 2016-01-25 DIAGNOSIS — Z5181 Encounter for therapeutic drug level monitoring: Secondary | ICD-10-CM

## 2016-01-25 DIAGNOSIS — I4891 Unspecified atrial fibrillation: Secondary | ICD-10-CM | POA: Diagnosis not present

## 2016-01-25 LAB — POCT INR: INR: 1.9

## 2016-02-29 ENCOUNTER — Ambulatory Visit (INDEPENDENT_AMBULATORY_CARE_PROVIDER_SITE_OTHER): Payer: Medicare Other | Admitting: *Deleted

## 2016-02-29 DIAGNOSIS — Z5181 Encounter for therapeutic drug level monitoring: Secondary | ICD-10-CM | POA: Diagnosis not present

## 2016-02-29 DIAGNOSIS — I4891 Unspecified atrial fibrillation: Secondary | ICD-10-CM | POA: Diagnosis not present

## 2016-02-29 DIAGNOSIS — Z7901 Long term (current) use of anticoagulants: Secondary | ICD-10-CM | POA: Diagnosis not present

## 2016-02-29 LAB — POCT INR: INR: 2

## 2016-03-28 ENCOUNTER — Ambulatory Visit (INDEPENDENT_AMBULATORY_CARE_PROVIDER_SITE_OTHER): Payer: Medicare Other | Admitting: *Deleted

## 2016-03-28 DIAGNOSIS — I4891 Unspecified atrial fibrillation: Secondary | ICD-10-CM

## 2016-03-28 DIAGNOSIS — Z5181 Encounter for therapeutic drug level monitoring: Secondary | ICD-10-CM

## 2016-03-28 DIAGNOSIS — Z7901 Long term (current) use of anticoagulants: Secondary | ICD-10-CM | POA: Diagnosis not present

## 2016-03-28 LAB — POCT INR: INR: 3.1

## 2016-04-02 ENCOUNTER — Encounter: Payer: Self-pay | Admitting: Internal Medicine

## 2016-04-02 ENCOUNTER — Ambulatory Visit (INDEPENDENT_AMBULATORY_CARE_PROVIDER_SITE_OTHER): Payer: Medicare Other | Admitting: Internal Medicine

## 2016-04-02 VITALS — BP 112/64 | HR 99 | Ht 70.0 in | Wt 238.0 lb

## 2016-04-02 DIAGNOSIS — I4821 Permanent atrial fibrillation: Secondary | ICD-10-CM

## 2016-04-02 DIAGNOSIS — I482 Chronic atrial fibrillation: Secondary | ICD-10-CM | POA: Diagnosis not present

## 2016-04-02 LAB — CUP PACEART INCLINIC DEVICE CHECK
Battery Voltage: 2.75 V
Date Time Interrogation Session: 20170710131908
Implantable Lead Implant Date: 20020722
Lead Channel Pacing Threshold Amplitude: 0.75 V
Lead Channel Pacing Threshold Amplitude: 0.875 V
Lead Channel Sensing Intrinsic Amplitude: 11.2 mV
MDC IDC LEAD LOCATION: 753860
MDC IDC MSMT BATTERY IMPEDANCE: 1734 Ohm
MDC IDC MSMT BATTERY REMAINING LONGEVITY: 45 mo
MDC IDC MSMT LEADCHNL RA IMPEDANCE VALUE: 0 Ohm
MDC IDC MSMT LEADCHNL RV IMPEDANCE VALUE: 713 Ohm
MDC IDC MSMT LEADCHNL RV PACING THRESHOLD PULSEWIDTH: 0.4 ms
MDC IDC MSMT LEADCHNL RV PACING THRESHOLD PULSEWIDTH: 0.4 ms
MDC IDC SET LEADCHNL RV PACING AMPLITUDE: 2.5 V
MDC IDC SET LEADCHNL RV PACING PULSEWIDTH: 0.4 ms
MDC IDC SET LEADCHNL RV SENSING SENSITIVITY: 4 mV
MDC IDC STAT BRADY RV PERCENT PACED: 20 %

## 2016-04-02 NOTE — Patient Instructions (Signed)
Your physician wants you to follow-up in: 1 Year with Dr. Lovena Le. You will receive a reminder letter in the mail two months in advance. If you don't receive a letter, please call our office to schedule the follow-up appointment.  Your physician recommends that you schedule a follow-up appointment in: 6 Months with the device clinic.   Your physician recommends that you continue on your current medications as directed. Please refer to the Current Medication list given to you today.  If you need a refill on your cardiac medications before your next appointment, please call your pharmacy.  Thank you for choosing [facility]!

## 2016-04-02 NOTE — Progress Notes (Signed)
HPI Mr Kevin Vazquez returns today for ongoing evaluation and management of his PPM. He is a pleasant 70 yo man with chronic atrial fibrillation and symptomatic bradycardia, s/p PPM insertion.  In the interim he has done well. He denies chest pain, sob, or syncope. No peripheral edema. 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 He has self reduced his medications. He has stopped taking statins altogether and the muscle and joint aches he was experiencing are much improved. No Known Allergies   Current Outpatient Prescriptions  Medication Sig Dispense Refill  . diltiazem (CARDIZEM) 30 MG tablet Take 1 tablet (30 mg total) by mouth daily. 90 tablet 3  . psyllium (METAMUCIL) 58.6 % packet Take 1 packet by mouth daily.    . tamsulosin (FLOMAX) 0.4 MG CAPS capsule TAKE 1 CAPSULE EVERY DAY 90 capsule 3  . warfarin (COUMADIN) 5 MG tablet TAKE 1 AND 1/2 TABLETS TO 2 TABLETS DAILY OR AS DIRECTED 180 tablet 3   No current facility-administered medications for this visit.     Past Medical History  Diagnosis Date  . Sick sinus syndrome ([place])   . Atrial fibrillation, permanent ([place])   . BPH (benign prostatic hyperplasia)     ROS:   All systems reviewed and negative except as noted in the HPI.   Past Surgical History  Procedure Laterality Date  . Pacemaker insertion  04/24/2010    Medtronic Adapta, model #ADSRO1, serial [id number]  . Left lower extremity venous doppler  02/07/2009    3.8x1.3x2.8cm hypoechoic area in lateral L thigh. No evidence of DVT.  Marland Kitchen Cardiac catheterization  01/26/1997    Normal LV function. Normal coronary arteries.  . Cardiovascular stress test  05/02/2011    Normal perfusion scan demonstrating diaphragmatic artifact. No significant ischemia demonstrated   . Transthoracic echocardiogram  11/26/2012    EF 50-55%, LA-appendage was moderately dilated     Family History  Problem Relation Age of Onset  . Heart disease       Social  History   Social History  . Marital Status: Married    Spouse Name: N/A  . Number of Children: N/A  . 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 Smoking status: Never Smoker   . Smokeless tobacco: Former Systems developer    Types: Chew  . Alcohol Use: 3.5 oz/week    7 Standard drinks or equivalent per week  . Drug Use: No  . Sexual Activity: Not on file   Other Topics Concern  . Not on file   Social History Narrative     BP 112/64 mmHg  Pulse 99  Ht 5\' 10"  (1.778 m)  Wt 238 lb (107.956 kg)  BMI 34.15 kg/m2  SpO2 96%  Physical Exam:  Well appearing 70 yo man,NAD HEENT: Unremarkable Neck:  6 cm JVD, no thyromegally Back:  No CVA tenderness Lungs:  Clear with no wheezes HEART:  IRegular rate rhythm, no murmurs, no rubs, no clicks Abd:  soft, positive bowel sounds, no organomegally, no rebound, no guarding Ext:  2 plus pulses, no edema, no cyanosis, no clubbing Skin:  No rashes no nodules Neuro:  CN II through XII intact, motor grossly intact   DEVICE  Normal device function.  See PaceArt for details.   Assess/Plan: 1. Atrial fib - his ventricular rate is reasonably well controlled. He will continue his current meds. 2. PPM -  his Medtronic VVIR PM is working normally. Will recheck in several months. 3. HTN - his blood pressure is well controlled. He is encouraged to lose weight.  Mikle Bosworth.D.

## 2016-04-12 ENCOUNTER — Encounter: Payer: No Typology Code available for payment source | Admitting: Internal Medicine

## 2016-04-25 ENCOUNTER — Ambulatory Visit (INDEPENDENT_AMBULATORY_CARE_PROVIDER_SITE_OTHER): Payer: Medicare Other | Admitting: *Deleted

## 2016-04-25 DIAGNOSIS — I4891 Unspecified atrial fibrillation: Secondary | ICD-10-CM | POA: Diagnosis not present

## 2016-04-25 DIAGNOSIS — Z5181 Encounter for therapeutic drug level monitoring: Secondary | ICD-10-CM | POA: Diagnosis not present

## 2016-04-25 DIAGNOSIS — Z7901 Long term (current) use of anticoagulants: Secondary | ICD-10-CM

## 2016-04-25 LAB — POCT INR: INR: 2.5

## 2016-05-10 ENCOUNTER — Other Ambulatory Visit: Payer: Self-pay | Admitting: Physician Assistant

## 2016-05-10 DIAGNOSIS — D485 Neoplasm of uncertain behavior of skin: Secondary | ICD-10-CM | POA: Diagnosis not present

## 2016-05-10 DIAGNOSIS — Q8500 Neurofibromatosis, unspecified: Secondary | ICD-10-CM | POA: Diagnosis not present

## 2016-05-10 DIAGNOSIS — D216 Benign neoplasm of connective and other soft tissue of trunk, unspecified: Secondary | ICD-10-CM | POA: Diagnosis not present

## 2016-05-10 DIAGNOSIS — L821 Other seborrheic keratosis: Secondary | ICD-10-CM | POA: Diagnosis not present

## 2016-05-10 DIAGNOSIS — L820 Inflamed seborrheic keratosis: Secondary | ICD-10-CM | POA: Diagnosis not present

## 2016-06-05 ENCOUNTER — Encounter: Payer: Self-pay | Admitting: Family Medicine

## 2016-06-05 ENCOUNTER — Other Ambulatory Visit: Payer: Medicare Other

## 2016-06-05 ENCOUNTER — Ambulatory Visit (INDEPENDENT_AMBULATORY_CARE_PROVIDER_SITE_OTHER): Payer: Medicare Other | Admitting: Family Medicine

## 2016-06-05 VITALS — BP 100/62 | HR 102 | Temp 98.3°F | Resp 18 | Wt 238.0 lb

## 2016-06-05 DIAGNOSIS — R300 Dysuria: Secondary | ICD-10-CM

## 2016-06-05 DIAGNOSIS — I4891 Unspecified atrial fibrillation: Secondary | ICD-10-CM

## 2016-06-05 DIAGNOSIS — Z79899 Other long term (current) drug therapy: Secondary | ICD-10-CM

## 2016-06-05 DIAGNOSIS — Z0000 Encounter for general adult medical examination without abnormal findings: Secondary | ICD-10-CM

## 2016-06-05 DIAGNOSIS — N400 Enlarged prostate without lower urinary tract symptoms: Secondary | ICD-10-CM | POA: Diagnosis not present

## 2016-06-05 DIAGNOSIS — E785 Hyperlipidemia, unspecified: Secondary | ICD-10-CM | POA: Diagnosis not present

## 2016-06-05 DIAGNOSIS — N390 Urinary tract infection, site not specified: Secondary | ICD-10-CM

## 2016-06-05 LAB — URINALYSIS, ROUTINE W REFLEX MICROSCOPIC
BILIRUBIN URINE: NEGATIVE
Glucose, UA: NEGATIVE
KETONES UR: NEGATIVE
Leukocytes, UA: NEGATIVE
NITRITE: NEGATIVE
PROTEIN: NEGATIVE
Specific Gravity, Urine: 1.02 (ref 1.001–1.035)
pH: 5.5 (ref 5.0–8.0)

## 2016-06-05 LAB — URINALYSIS, MICROSCOPIC ONLY
CASTS: NONE SEEN [LPF]
CRYSTALS: NONE SEEN [HPF]
Squamous Epithelial / LPF: NONE SEEN [HPF] (ref <=5)
Yeast: NONE SEEN [HPF]

## 2016-06-05 MED ORDER — CEFTRIAXONE SODIUM 500 MG IJ SOLR
500.0000 mg | Freq: Once | INTRAMUSCULAR | Status: AC
  Administered 2016-06-05: 500 mg via INTRAMUSCULAR

## 2016-06-05 MED ORDER — CIPROFLOXACIN HCL 500 MG PO TABS: 500.0000 mg | ORAL_TABLET | Freq: Two times a day (BID) | ORAL | 0 refills | Status: DC

## 2016-06-05 NOTE — Progress Notes (Signed)
   Subjective:    Patient ID: Kevin Vazquez, male    DOB: 04/15/1946, 70 y.o.   MRN: [id number]  Patient presents for Urinary Tract Infection (pain in groin area, pain when urinate, going on about a week)  Patient here with pressure burning with urination pain in his suprapubic region for the past week. His symptoms are similar to when he was treated back in January for presumed prostatitis. He did have a negative culture at that time but improved with antibiotics. He also tells me that he does not take his Flomax rectally that he does have BPH. He had chills last night. He has not had any fever. No nausea or vomiting. His bowels are moving well.    Review Of Systems:  GEN- denies fatigue, fever, weight loss,weakness, recent illness HEENT- denies eye drainage, change in vision, nasal discharge, CVS- denies chest pain, palpitations RESP- denies SOB, cough, wheeze ABD- denies N/V, change in stools, abd pain GU- denies dysuria, hematuria, dribbling, incontinence MSK- denies joint pain, muscle aches, injury Neuro- denies headache, dizziness, syncope, seizure activity       Objective:    BP 100/62 (BP Location: Right Arm, Patient Position: Sitting, Cuff Size: Large)   Pulse (!) 102   Temp 98.3 F (36.8 C) (Oral)   Resp 18   Wt 238 lb (108 kg)   BMI 34.15 kg/m  GEN- NAD, alert and oriented x3 HEENT- PERRL, EOMI, non injected sclera, pink conjunctiva, MMM, oropharynx clear CVS- irregulary rhythem, normal rate , no murmur RESP-CTAB ABD-NABS,soft,, TTP suprapubic region, no rebound, no guarding  EXT- No edema Pulses- Radial 2+        Assessment & Plan:      Problem List Items Addressed This Visit    None    Visit Diagnoses    UTI (lower urinary tract infection)    -  Primary   Known BPH , he is not completely obstructed still urinating, but has significant pain/pressure in suprapubic region, ? if infection developing due to untreated BPH. He wanted injection today, UA  showed bacteria and small Microscopic hematuria. I given him Rocephin 500 mg culture sent. PSA and white blood cell count done today as well. I will start him on Cipro 500 mg twice a day he will restart the Flomax 0.4 mg daily. Depending on results of labs and recurrent symptoms I discussed with him that I will recommend him seeing urology    Relevant Orders   Urine culture   BPH (benign prostatic hypertrophy)          Note: This dictation was prepared with Dragon dictation along with smaller phrase technology. Any transcriptional errors that result from this process are unintentional.

## 2016-06-05 NOTE — Addendum Note (Signed)
Addended by: Vonna Kotyk A on: 06/05/2016 09:50 AM   Modules accepted: Orders

## 2016-06-05 NOTE — Patient Instructions (Addendum)
Shot of rocephin 500mg  IM given Take the Cipro as prescribed  Take the flomax We will review lab results F/U as previous

## 2016-06-06 LAB — COMPLETE METABOLIC PANEL WITH GFR
ALT: 13 U/L (ref 9–46)
AST: 18 U/L (ref 10–35)
Albumin: 3.8 g/dL (ref 3.6–5.1)
Alkaline Phosphatase: 47 U/L (ref 40–115)
BUN: 17 mg/dL (ref 7–25)
CHLORIDE: 103 mmol/L (ref 98–110)
CO2: 26 mmol/L (ref 20–31)
Calcium: 8.8 mg/dL (ref 8.6–10.3)
Creat: 1.16 mg/dL (ref 0.70–1.18)
GFR, EST NON AFRICAN AMERICAN: 63 mL/min (ref >=60)
GFR, Est African American: 73 mL/min (ref >=60)
GLUCOSE: 80 mg/dL (ref 70–99)
POTASSIUM: 4.4 mmol/L (ref 3.5–5.3)
SODIUM: 139 mmol/L (ref 135–146)
Total Bilirubin: 0.9 mg/dL (ref 0.2–1.2)
Total Protein: 6.1 g/dL (ref 6.1–8.1)

## 2016-06-06 LAB — LIPID PANEL
CHOL/HDL RATIO: 5.2 ratio — AB (ref <=5.0)
Cholesterol: 237 mg/dL — ABNORMAL HIGH (ref 125–200)
HDL: 46 mg/dL (ref >=40)
LDL CALC: 168 mg/dL — AB (ref <130)
TRIGLYCERIDES: 114 mg/dL (ref <150)
VLDL: 23 mg/dL (ref <30)

## 2016-06-06 LAB — CBC WITH DIFFERENTIAL/PLATELET
BASOS ABS: 0 {cells}/uL (ref 0–200)
Basophils Relative: 0 %
EOS PCT: 2 %
Eosinophils Absolute: 166 cells/uL (ref 15–500)
HCT: 43.6 % (ref 38.5–50.0)
HEMOGLOBIN: 14.9 g/dL (ref 13.0–17.0)
LYMPHS ABS: 1245 {cells}/uL (ref 850–3900)
LYMPHS PCT: 15 %
MCH: 30.7 pg (ref 27.0–33.0)
MCHC: 34.2 g/dL (ref 32.0–36.0)
MCV: 89.9 fL (ref 80.0–100.0)
MONOS PCT: 9 %
MPV: 9.9 fL (ref 7.5–12.5)
Monocytes Absolute: 747 cells/uL (ref 200–950)
NEUTROS PCT: 74 %
Neutro Abs: 6142 cells/uL (ref 1500–7800)
Platelets: 185 10*3/uL (ref 140–400)
RBC: 4.85 MIL/uL (ref 4.20–5.80)
RDW: 14.9 % (ref 11.0–15.0)
WBC: 8.3 10*3/uL (ref 3.8–10.8)

## 2016-06-06 LAB — URINE CULTURE: ORGANISM ID, BACTERIA: NO GROWTH

## 2016-06-06 LAB — TSH: TSH: 4.48 mIU/L (ref 0.40–4.50)

## 2016-06-11 ENCOUNTER — Ambulatory Visit (INDEPENDENT_AMBULATORY_CARE_PROVIDER_SITE_OTHER): Payer: Medicare Other | Admitting: *Deleted

## 2016-06-11 DIAGNOSIS — I4891 Unspecified atrial fibrillation: Secondary | ICD-10-CM

## 2016-06-11 DIAGNOSIS — Z5181 Encounter for therapeutic drug level monitoring: Secondary | ICD-10-CM

## 2016-06-11 DIAGNOSIS — Z7901 Long term (current) use of anticoagulants: Secondary | ICD-10-CM

## 2016-06-11 LAB — POCT INR: INR: 2.8

## 2016-06-18 ENCOUNTER — Other Ambulatory Visit: Payer: Self-pay | Admitting: Family Medicine

## 2016-06-18 ENCOUNTER — Ambulatory Visit (INDEPENDENT_AMBULATORY_CARE_PROVIDER_SITE_OTHER): Payer: Medicare Other | Admitting: Family Medicine

## 2016-06-18 ENCOUNTER — Encounter: Payer: Self-pay | Admitting: Family Medicine

## 2016-06-18 VITALS — BP 98/58 | HR 80 | Temp 98.0°F | Resp 18 | Ht 72.0 in | Wt 235.0 lb

## 2016-06-18 DIAGNOSIS — Z23 Encounter for immunization: Secondary | ICD-10-CM

## 2016-06-18 DIAGNOSIS — Z125 Encounter for screening for malignant neoplasm of prostate: Secondary | ICD-10-CM | POA: Diagnosis not present

## 2016-06-18 DIAGNOSIS — Z1159 Encounter for screening for other viral diseases: Secondary | ICD-10-CM

## 2016-06-18 DIAGNOSIS — E785 Hyperlipidemia, unspecified: Secondary | ICD-10-CM

## 2016-06-18 DIAGNOSIS — Z0000 Encounter for general adult medical examination without abnormal findings: Secondary | ICD-10-CM | POA: Diagnosis not present

## 2016-06-18 MED ORDER — PRAVASTATIN SODIUM 20 MG PO TABS: 20.0000 mg | ORAL_TABLET | Freq: Every day | ORAL | 3 refills | Status: DC

## 2016-06-18 NOTE — Progress Notes (Signed)
Subjective:    Patient ID: Kevin Vazquez, male    DOB: 1946/08/18, 70 y.o.   MRN: [id number]  HPI Patient is here today for complete physical exam. His last colonoscopy was in 2016. No significant for 3 tubular adenomas. They recommended a repeat colonoscopy in 2019. He is due for prostate cancer screening. Pneumovax 23 is up-to-date. Shingles vaccine and tetanus vaccine are up-to-date. He is due for Prevnar 13 as well as the flu shot. He denies any depression or falls. He declines digital rectal exam today. Immunization record and lab work as listed below Immunization History  Administered Date(s) Administered  . Influenza,inj,Quad PF,36+ Mos 07/04/2015  . Pneumococcal Polysaccharide-23 05/25/2010  . Tdap 07/17/2012  . Zoster 10/16/2012   Office Visit on 06/05/2016  Component Date Value Ref Range Status  . Organism ID, Bacteria 06/06/2016 NO GROWTH   Final  Lab on 06/05/2016  Component Date Value Ref Range Status  . Color, Urine 06/05/2016 YELLOW  YELLOW Final  . APPearance 06/05/2016 CLEAR  CLEAR Final  . Specific Gravity, Urine 06/05/2016 1.020  1.001 - 1.035 Final  . pH 06/05/2016 5.5  5.0 - 8.0 Final  . Glucose, UA 06/05/2016 NEGATIVE  NEGATIVE Final  . Bilirubin Urine 06/05/2016 NEGATIVE  NEGATIVE Final  . Ketones, ur 06/05/2016 NEGATIVE  NEGATIVE Final  . Hgb urine dipstick 06/05/2016 TRACE* NEGATIVE Final  . Protein, ur 06/05/2016 NEGATIVE  NEGATIVE Final  . Nitrite 06/05/2016 NEGATIVE  NEGATIVE Final  . Leukocytes, UA 06/05/2016 NEGATIVE  NEGATIVE Final  . Sodium 06/06/2016 139  135 - 146 mmol/L Final  . Potassium 06/06/2016 4.4  3.5 - 5.3 mmol/L Final  . Chloride 06/06/2016 103  98 - 110 mmol/L Final  . CO2 06/06/2016 26  20 - 31 mmol/L Final  . Glucose, Bld 06/06/2016 80  70 - 99 mg/dL Final  . BUN 06/06/2016 17  7 - 25 mg/dL Final  . Creat 06/06/2016 1.16  0.70 - 1.18 mg/dL Final   Comment:   For patients > or = 70 years of age: The upper reference limit  for Creatinine is approximately 13% higher for people identified as African-American.     . Total Bilirubin 06/06/2016 0.9  0.2 - 1.2 mg/dL Final  . Alkaline Phosphatase 06/06/2016 47  40 - 115 U/L Final  . AST 06/06/2016 18  10 - 35 U/L Final  . ALT 06/06/2016 13  9 - 46 U/L Final  . Total Protein 06/06/2016 6.1  6.1 - 8.1 g/dL Final  . Albumin 06/06/2016 3.8  3.6 - 5.1 g/dL Final  . Calcium 06/06/2016 8.8  8.6 - 10.3 mg/dL Final  . GFR, Est African American 06/06/2016 73  >=60 mL/min Final  . GFR, Est Non African American 06/06/2016 63  >=60 mL/min Final  . TSH 06/06/2016 4.48  0.40 - 4.50 mIU/L Final  . Cholesterol 06/06/2016 237* 125 - 200 mg/dL Final  . Triglycerides 06/06/2016 114  <150 mg/dL Final  . HDL 06/06/2016 46  >=40 mg/dL Final  . Total CHOL/HDL Ratio 06/06/2016 5.2* <=5.0 Ratio Final  . VLDL 06/06/2016 23  <30 mg/dL Final  . LDL Cholesterol 06/06/2016 168* <130 mg/dL Final   Comment:   Total Cholesterol/HDL Ratio:CHD Risk                        Coronary Heart Disease Risk Table  Men       Women          1/2 Average Risk              3.4        3.3              Average Risk              5.0        4.4           2X Average Risk              9.6        7.1           3X Average Risk             23.4       11.0 Use the calculated Patient Ratio above and the CHD Risk table  to determine the patient's CHD Risk.   . WBC 06/06/2016 8.3  3.8 - 10.8 K/uL Final  . RBC 06/06/2016 4.85  4.20 - 5.80 MIL/uL Final  . Hemoglobin 06/06/2016 14.9  13.0 - 17.0 g/dL Final  . HCT 06/06/2016 43.6  38.5 - 50.0 % Final  . MCV 06/06/2016 89.9  80.0 - 100.0 fL Final  . MCH 06/06/2016 30.7  27.0 - 33.0 pg Final  . MCHC 06/06/2016 34.2  32.0 - 36.0 g/dL Final  . RDW 06/06/2016 14.9  11.0 - 15.0 % Final  . Platelets 06/06/2016 185  140 - 400 K/uL Final  . MPV 06/06/2016 9.9  7.5 - 12.5 fL Final  . Neutro Abs 06/06/2016 6142  1,500 - 7,800 cells/uL  Final  . Lymphs Abs 06/06/2016 1245  850 - 3,900 cells/uL Final  . Monocytes Absolute 06/06/2016 747  200 - 950 cells/uL Final  . Eosinophils Absolute 06/06/2016 166  15 - 500 cells/uL Final  . Basophils Absolute 06/06/2016 0  0 - 200 cells/uL Final  . Neutrophils Relative % 06/06/2016 74  % Final  . Lymphocytes Relative 06/06/2016 15  % Final  . Monocytes Relative 06/06/2016 9  % Final  . Eosinophils Relative 06/06/2016 2  % Final  . Basophils Relative 06/06/2016 0  % Final  . Smear Review 06/06/2016 Criteria for review not met   Final  . WBC, UA 06/05/2016 0-5  <=5 WBC/HPF Final  . RBC / HPF 06/05/2016 0-2  <=2 RBC/HPF Final  . Squamous Epithelial / LPF 06/05/2016 NONE SEEN  <=5 HPF Final  . Bacteria, UA 06/05/2016 FEW* NONE SEEN HPF Final  . Crystals 06/05/2016 NONE SEEN  NONE SEEN HPF Final  . Casts 06/05/2016 NONE SEEN  NONE SEEN LPF Final  . Yeast 06/05/2016 NONE SEEN  NONE SEEN HPF Final   Past Medical History:  Diagnosis Date  . Atrial fibrillation, permanent ([place])   . BPH (benign prostatic hyperplasia)   . Sick sinus syndrome [facility])    Past Surgical History:  Procedure Laterality Date  . CARDIAC CATHETERIZATION  01/26/1997   Normal LV function. Normal coronary arteries.  Marland Kitchen CARDIOVASCULAR STRESS TEST  05/02/2011   Normal perfusion scan demonstrating diaphragmatic artifact. No significant ischemia demonstrated   . LEFT LOWER EXTREMITY VENOUS DOPPLER  02/07/2009   3.8x1.3x2.8cm hypoechoic area in lateral L thigh. No evidence of DVT.  Marland Kitchen PACEMAKER INSERTION  04/24/2010   Medtronic Adapta, model #ADSRO1, serial [id number]  . TRANSTHORACIC ECHOCARDIOGRAM  11/26/2012   EF 50-55%, LA-appendage was moderately dilated   Current Outpatient Prescriptions  on File Prior to Visit  Medication Sig Dispense Refill  . diltiazem (CARDIZEM) 30 MG tablet Take 1 tablet (30 mg total) by mouth daily. 90 tablet 3  . psyllium (METAMUCIL) 58.6 % packet Take 1 packet by mouth daily.    . tamsulosin  (FLOMAX) 0.4 MG CAPS capsule TAKE 1 CAPSULE EVERY DAY 90 capsule 3  . warfarin (COUMADIN) 5 MG tablet TAKE 1 AND 1/2 TABLETS TO 2 TABLETS DAILY OR AS DIRECTED 180 tablet 3   No current facility-administered medications on file prior to visit.    No Known Allergies Social History   Social History  . Marital status: Married    Spouse name: N/A  . Number of children: N/A  . Years of education: N/A   Occupational History  . Mechanic Retired    Social History Main Topics  . Smoking status: Never Smoker  . Smokeless tobacco: Former Systems developer    Types: Chew  . Alcohol use 3.5 oz/week    7 Standard drinks or equivalent per week  . Drug use: No  . Sexual activity: Not on file   Other Topics Concern  . Not on file   Social History Narrative  . No narrative on file   Family History  Problem Relation Age of Onset  . Heart disease        Review of Systems  All other systems reviewed and are negative.      Objective:   Physical Exam  Constitutional: He is oriented to person, place, and time. He appears well-developed and well-nourished. No distress.  HENT:  Head: Normocephalic and atraumatic.  Right Ear: External ear normal.  Left Ear: External ear normal.  Nose: Nose normal.  Mouth/Throat: Oropharynx is clear and moist. No oropharyngeal exudate.  Eyes: Conjunctivae and EOM are normal. Pupils are equal, round, and reactive to light. Right eye exhibits no discharge. Left eye exhibits no discharge. No scleral icterus.  Neck: Normal range of motion. Neck supple. No JVD present. No tracheal deviation present. No thyromegaly present.  Cardiovascular: Normal rate, normal heart sounds and intact distal pulses.  An irregularly irregular rhythm present. Exam reveals no gallop and no friction rub.   No murmur heard. Pulmonary/Chest: Effort normal and breath sounds normal. No stridor. No respiratory distress. He has no wheezes. He has no rales. He exhibits no tenderness.  Abdominal: Soft.  Bowel sounds are normal. He exhibits no distension and no mass. There is no tenderness. There is no rebound and no guarding.  Musculoskeletal: Normal range of motion. He exhibits no edema, tenderness or deformity.  Lymphadenopathy:    He has no cervical adenopathy.  Neurological: He is alert and oriented to person, place, and time. He has normal reflexes. He displays normal reflexes. No cranial nerve deficit. He exhibits normal muscle tone. Coordination normal.  Skin: Skin is warm. No rash noted. He is not diaphoretic. No erythema. No pallor.  Psychiatric: He has a normal mood and affect. His behavior is normal. Judgment and thought content normal.  Vitals reviewed.         Assessment & Plan:  Prostate cancer screening - Plan: PSA  Need for hepatitis C screening test - Plan: Hepatitis C Ab Reflex HCV RNA, QUANT  HLD (hyperlipidemia) - Plan: pravastatin (PRAVACHOL) 20 MG tablet  Routine general medical examination at a health care facility  Exam is normal. Labs are significant for an elevated LDL cholesterol. Recommended pravastatin 20 mg daily. 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 Therefore will  try low-dose of a week statin to see if he can tolerate it. He received his flu shot and Prevnar 13 today. I will check a PSA. Declines digital rectal exam. I will also screen the patient for hepatitis C. The remainder of his preventative care is up-to-date

## 2016-06-18 NOTE — Addendum Note (Signed)
Addended by: Shary Decamp B on: 06/18/2016 12:44 PM   Modules accepted: Orders

## 2016-06-19 LAB — PSA: PSA: 2.4 ng/mL (ref <=4.0)

## 2016-06-19 LAB — HEPATITIS C ANTIBODY: HCV AB: NEGATIVE

## 2016-07-23 ENCOUNTER — Ambulatory Visit (INDEPENDENT_AMBULATORY_CARE_PROVIDER_SITE_OTHER): Payer: Medicare Other | Admitting: *Deleted

## 2016-07-23 DIAGNOSIS — Z5181 Encounter for therapeutic drug level monitoring: Secondary | ICD-10-CM

## 2016-07-23 DIAGNOSIS — Z7901 Long term (current) use of anticoagulants: Secondary | ICD-10-CM

## 2016-07-23 DIAGNOSIS — I4891 Unspecified atrial fibrillation: Secondary | ICD-10-CM | POA: Diagnosis not present

## 2016-07-23 LAB — POCT INR: INR: 2.5

## 2016-07-24 DIAGNOSIS — M170 Bilateral primary osteoarthritis of knee: Secondary | ICD-10-CM | POA: Diagnosis not present

## 2016-07-24 DIAGNOSIS — M25561 Pain in right knee: Secondary | ICD-10-CM | POA: Diagnosis not present

## 2016-07-24 DIAGNOSIS — M25562 Pain in left knee: Secondary | ICD-10-CM | POA: Diagnosis not present

## 2016-07-31 DIAGNOSIS — M1712 Unilateral primary osteoarthritis, left knee: Secondary | ICD-10-CM | POA: Diagnosis not present

## 2016-07-31 DIAGNOSIS — M25562 Pain in left knee: Secondary | ICD-10-CM | POA: Diagnosis not present

## 2016-08-01 DIAGNOSIS — M1711 Unilateral primary osteoarthritis, right knee: Secondary | ICD-10-CM | POA: Diagnosis not present

## 2016-08-01 DIAGNOSIS — M25561 Pain in right knee: Secondary | ICD-10-CM | POA: Diagnosis not present

## 2016-08-07 DIAGNOSIS — M1712 Unilateral primary osteoarthritis, left knee: Secondary | ICD-10-CM | POA: Diagnosis not present

## 2016-08-07 DIAGNOSIS — M25562 Pain in left knee: Secondary | ICD-10-CM | POA: Diagnosis not present

## 2016-08-08 DIAGNOSIS — M1711 Unilateral primary osteoarthritis, right knee: Secondary | ICD-10-CM | POA: Diagnosis not present

## 2016-08-08 DIAGNOSIS — M25561 Pain in right knee: Secondary | ICD-10-CM | POA: Diagnosis not present

## 2016-08-13 ENCOUNTER — Telehealth: Payer: Self-pay | Admitting: Cardiology

## 2016-08-13 NOTE — Telephone Encounter (Signed)
Pt called and stated that his energy level is extremely lower than usual, he can feel his heart rate being erratic. After consulting w/ device clinic afforded pt and appt for Tuesday 08-14-16 at 1:30 PM.

## 2016-08-14 ENCOUNTER — Ambulatory Visit (INDEPENDENT_AMBULATORY_CARE_PROVIDER_SITE_OTHER): Payer: Medicare Other | Admitting: Internal Medicine

## 2016-08-14 ENCOUNTER — Encounter: Payer: Self-pay | Admitting: Internal Medicine

## 2016-08-14 DIAGNOSIS — I4821 Permanent atrial fibrillation: Secondary | ICD-10-CM

## 2016-08-14 DIAGNOSIS — I482 Chronic atrial fibrillation: Secondary | ICD-10-CM

## 2016-08-14 DIAGNOSIS — M25562 Pain in left knee: Secondary | ICD-10-CM | POA: Diagnosis not present

## 2016-08-14 DIAGNOSIS — Z950 Presence of cardiac pacemaker: Secondary | ICD-10-CM

## 2016-08-14 DIAGNOSIS — M1712 Unilateral primary osteoarthritis, left knee: Secondary | ICD-10-CM | POA: Diagnosis not present

## 2016-08-14 LAB — CUP PACEART INCLINIC DEVICE CHECK
Battery Impedance: 1789 Ohm
Battery Remaining Longevity: 45 mo
Battery Voltage: 2.75 V
Brady Statistic RV Percent Paced: 10 %
Implantable Lead Location: 753860
Implantable Lead Model: 5092
Lead Channel Impedance Value: 0 Ohm
Lead Channel Impedance Value: 762 Ohm
Lead Channel Setting Pacing Amplitude: 2.5 V
Lead Channel Setting Pacing Pulse Width: 0.4 ms
Lead Channel Setting Sensing Sensitivity: 4 mV
MDC IDC LEAD IMPLANT DT: 20020722
MDC IDC MSMT LEADCHNL RV PACING THRESHOLD AMPLITUDE: 0.625 V
MDC IDC MSMT LEADCHNL RV PACING THRESHOLD PULSEWIDTH: 0.4 ms
MDC IDC MSMT LEADCHNL RV SENSING INTR AMPL: 11.2 mV
MDC IDC PG IMPLANT DT: 20110801
MDC IDC SESS DTM: 20171121150927

## 2016-08-14 MED ORDER — DILTIAZEM HCL ER COATED BEADS 240 MG PO CP24: 240.0000 mg | ORAL_CAPSULE | Freq: Every day | ORAL | 0 refills | Status: DC

## 2016-08-14 NOTE — Progress Notes (Addendum)
HPI Kevin Vazquez returns today for ongoing evaluation and management of his PPM. He is a pleasant 70 yo man with chronic atrial fibrillation and symptomatic bradycardia, s/p PPM insertion.  In the interim he has been stable until the last few days when he felt like his heart was racing. He denies chest pain, sob, or syncope. No peripheral edema. He denies compliance problems. He is only on low dose cardizem. Remotely he took digoxin. No Known Allergies   Current Outpatient Prescriptions  Medication Sig Dispense Refill  . diltiazem (CARDIZEM) 30 MG tablet Take 1 tablet (30 mg total) by mouth daily. 90 tablet 3  . psyllium (METAMUCIL) 58.6 % packet Take 1 packet by mouth daily.    . tamsulosin (FLOMAX) 0.4 MG CAPS capsule TAKE 1 CAPSULE EVERY DAY 90 capsule 3  . warfarin (COUMADIN) 5 MG tablet TAKE 1 AND 1/2 TABLETS TO 2 TABLETS DAILY OR AS DIRECTED 180 tablet 3  . diltiazem (CARDIZEM CD) 240 MG 24 hr capsule Take 1 capsule (240 mg total) by mouth daily. May take Diltiazem 30mg  as needed for increased heart rate. 90 capsule 0  . pravastatin (PRAVACHOL) 20 MG tablet Take 1 tablet (20 mg total) by mouth daily. (Patient not taking: Reported on 08/14/2016) 90 tablet 3   No current facility-administered medications for this visit.      Past Medical History:  Diagnosis Date  . Atrial fibrillation, permanent ([place])   . BPH (benign prostatic hyperplasia)   . Sick sinus syndrome ([facility])     ROS:   All systems reviewed and negative except as noted in the HPI.   Past Surgical History:  Procedure Laterality Date  . CARDIAC CATHETERIZATION  01/26/1997   Normal LV function. Normal coronary arteries.  Marland Kitchen CARDIOVASCULAR STRESS TEST  05/02/2011   Normal perfusion scan demonstrating diaphragmatic artifact. No significant ischemia demonstrated   . LEFT LOWER EXTREMITY VENOUS DOPPLER  02/07/2009   3.8x1.3x2.8cm hypoechoic area in lateral L thigh. No evidence of DVT.  Marland Kitchen PACEMAKER INSERTION  04/24/2010   Medtronic Adapta, model #ADSRO1, serial [id number]  . TRANSTHORACIC ECHOCARDIOGRAM  11/26/2012   EF 50-55%, LA-appendage was moderately dilated     Family History  Problem Relation Age of Onset  . Heart disease       Social History   Social History  . Marital status: Married    Spouse name: N/A  . Number of children: N/A  . Years of education: N/A   Occupational History  . Mechanic Retired    Social History Main Topics  . Smoking status: Never Smoker  . Smokeless tobacco: Former Systems developer    Types: Chew  . Alcohol use 3.5 oz/week    7 Standard drinks or equivalent per week  . Drug use: No  . Sexual activity: Not on file   Other Topics Concern  . Not on file   Social History Narrative  . No narrative on file     BP - 116/70, P- 130 and IRIR, R - 16  Physical Exam:  Well appearing 70 yo man,NAD HEENT: Unremarkable Neck:  6 cm JVD, no thyromegally Back:  No CVA tenderness Lungs:  Clear with no wheezes HEART:  IRegular tachy rate rhythm, no murmurs, no rubs, no clicks Abd:  soft, positive bowel sounds, no organomegally, no rebound, no guarding Ext:  2 plus pulses, no edema, no cyanosis, no clubbing Skin:  No rashes no nodules Neuro:  CN II through XII intact, motor grossly intact  DEVICE  Normal device function.  See PaceArt for details.   Assess/Plan: 1. Atrial fib - his ventricular rate is not well controlled. We will switch him to cardizem CD 240/d and ask him to use cardizem 30 mg as needed. 2. PPM - his Medtronic VVIR PM is working normally. Will recheck in several months. 3. HTN - his blood pressure is well controlled. He is encouraged to lose weight. 4. Acute diastolic heart failure - his symptoms are more low output due to his RVR. Hopefully as we slow him down he will improve. He is not volume overloaded at the moment.  Mikle Bosworth.D.

## 2016-08-15 DIAGNOSIS — M25561 Pain in right knee: Secondary | ICD-10-CM | POA: Diagnosis not present

## 2016-08-15 DIAGNOSIS — M1711 Unilateral primary osteoarthritis, right knee: Secondary | ICD-10-CM | POA: Diagnosis not present

## 2016-08-21 DIAGNOSIS — M170 Bilateral primary osteoarthritis of knee: Secondary | ICD-10-CM | POA: Diagnosis not present

## 2016-08-21 DIAGNOSIS — M25561 Pain in right knee: Secondary | ICD-10-CM | POA: Diagnosis not present

## 2016-08-21 DIAGNOSIS — M25562 Pain in left knee: Secondary | ICD-10-CM | POA: Diagnosis not present

## 2016-09-03 ENCOUNTER — Ambulatory Visit (INDEPENDENT_AMBULATORY_CARE_PROVIDER_SITE_OTHER): Payer: Medicare Other | Admitting: *Deleted

## 2016-09-03 DIAGNOSIS — Z5181 Encounter for therapeutic drug level monitoring: Secondary | ICD-10-CM

## 2016-09-03 DIAGNOSIS — I4891 Unspecified atrial fibrillation: Secondary | ICD-10-CM

## 2016-09-03 DIAGNOSIS — Z7901 Long term (current) use of anticoagulants: Secondary | ICD-10-CM | POA: Diagnosis not present

## 2016-09-03 LAB — POCT INR: INR: 2.5

## 2016-09-05 ENCOUNTER — Encounter: Payer: Self-pay | Admitting: Internal Medicine

## 2016-09-11 ENCOUNTER — Encounter: Payer: Self-pay | Admitting: Internal Medicine

## 2016-09-11 ENCOUNTER — Ambulatory Visit (INDEPENDENT_AMBULATORY_CARE_PROVIDER_SITE_OTHER): Payer: Medicare Other | Admitting: Internal Medicine

## 2016-09-11 VITALS — BP 118/82 | HR 81 | Ht 70.0 in | Wt 238.8 lb

## 2016-09-11 DIAGNOSIS — I4891 Unspecified atrial fibrillation: Secondary | ICD-10-CM | POA: Diagnosis not present

## 2016-09-11 DIAGNOSIS — Z950 Presence of cardiac pacemaker: Secondary | ICD-10-CM

## 2016-09-11 MED ORDER — DILTIAZEM HCL ER COATED BEADS 240 MG PO CP24: 240.0000 mg | ORAL_CAPSULE | Freq: Every day | ORAL | 3 refills | Status: DC

## 2016-09-11 NOTE — Patient Instructions (Addendum)
Medication Instructions:  Your physician recommends that you continue on your current medications as directed. Please refer to the Current Medication list given to you today.   Labwork: None Ordered   Testing/Procedures: None Ordered   Follow-Up: Your physician wants you to follow-up in: 5 months in [facility] and in November 2018 with Dr. Lovena Le.  You will receive a reminder letter in the mail two months in advance. If you don't receive a letter, please call our office to schedule the follow-up appointment.   Any Other Special Instructions Will Be Listed Below (If Applicable).     If you need a refill on your cardiac medications before your next appointment, please call your pharmacy.

## 2016-09-11 NOTE — Progress Notes (Signed)
HPI Mr Scheiber returns today for ongoing evaluation and management of his PPM and atrial fibrillation. He is a pleasant 70 yo man with chronic atrial fibrillation and symptomatic bradycardia, s/p PPM insertion. I saw him a month ago and his VR was increased and he felt poorly. We had him uptitrate his cardizem to 240 mg daily. He feels better.  No Known Allergies   Current Outpatient Prescriptions  Medication Sig Dispense Refill  . diltiazem (CARDIZEM CD) 240 MG 24 hr capsule Take 240 mg by mouth daily.    . psyllium (METAMUCIL) 58.6 % packet Take 1 packet by mouth daily.    . tamsulosin (FLOMAX) 0.4 MG CAPS capsule Take 0.4 mg by mouth daily.    Marland Kitchen warfarin (COUMADIN) 5 MG tablet TAKE 1 AND 1/2 TABLETS TO 2 TABLETS DAILY OR AS DIRECTED 180 tablet 3   No current facility-administered medications for this visit.      Past Medical History:  Diagnosis Date  . Atrial fibrillation, permanent ([place])   . BPH (benign prostatic hyperplasia)   . Sick sinus syndrome ([facility])     ROS:   All systems reviewed and negative except as noted in the HPI.   Past Surgical History:  Procedure Laterality Date  . CARDIAC CATHETERIZATION  01/26/1997   Normal LV function. Normal coronary arteries.  Marland Kitchen CARDIOVASCULAR STRESS TEST  05/02/2011   Normal perfusion scan demonstrating diaphragmatic artifact. No significant ischemia demonstrated   . LEFT LOWER EXTREMITY VENOUS DOPPLER  02/07/2009   3.8x1.3x2.8cm hypoechoic area in lateral L thigh. No evidence of DVT.  Marland Kitchen PACEMAKER INSERTION  04/24/2010   Medtronic Adapta, model #ADSRO1, serial [id number]  . TRANSTHORACIC ECHOCARDIOGRAM  11/26/2012   EF 50-55%, LA-appendage was moderately dilated     Family History  Problem Relation Age of Onset  . Heart disease       Social History   Social History  . Marital status: Married    Spouse name: N/A  . Number of children: N/A  . Years of education: N/A   Occupational History  . Mechanic Retired     Social History Main Topics  . Smoking status: Never Smoker  . Smokeless tobacco: Former Systems developer    Types: Chew  . Alcohol use 3.5 oz/week    7 Standard drinks or equivalent per week  . Drug use: No  . Sexual activity: Not on file   Other Topics Concern  . Not on file   Social History Narrative  . No narrative on file     BP - 118/82, P-81, R - 16  Physical Exam:  Well appearing 70 yo man,NAD HEENT: Unremarkable Neck:  6 cm JVD, no thyromegally Back:  No CVA tenderness Lungs:  Clear with no wheezes HEART:  IRegular rate rhythm, no murmurs, no rubs, no clicks Abd:  soft, positive bowel sounds, no organomegally, no rebound, no guarding Ext:  2 plus pulses, no edema, no cyanosis, no clubbing Skin:  No rashes no nodules Neuro:  CN II through XII intact, motor grossly intact   DEVICE  Normal device function.  See PaceArt for details.   Assess/Plan: 1. Atrial fib - his ventricular rate is under better control. We will continue him on cardizem CD 240/d and ask him to use cardizem 30 mg as needed. 2. PPM - his Medtronic VVIR PM is working normally. Will recheck in several months. 3. HTN - his blood pressure is well controlled. He is encouraged to lose weight. 4. chronic  diastolic heart failure - his symptoms are much improved. He has still had some high VR but is improved and his symptoms have as well.  Mikle Bosworth.D.

## 2016-09-29 ENCOUNTER — Other Ambulatory Visit: Payer: Self-pay | Admitting: Family Medicine

## 2016-10-03 ENCOUNTER — Other Ambulatory Visit: Payer: Self-pay

## 2016-10-03 MED ORDER — DILTIAZEM HCL ER COATED BEADS 240 MG PO CP24: 240.0000 mg | ORAL_CAPSULE | Freq: Every day | ORAL | 3 refills | Status: DC

## 2016-10-04 ENCOUNTER — Other Ambulatory Visit: Payer: Self-pay

## 2016-10-04 MED ORDER — DILTIAZEM HCL 30 MG PO TABS: ORAL_TABLET | ORAL | 1 refills | Status: DC

## 2016-10-04 NOTE — Telephone Encounter (Signed)
see office note,dr taylor said pt can take diltiazem 30 mg also

## 2016-10-15 ENCOUNTER — Ambulatory Visit (INDEPENDENT_AMBULATORY_CARE_PROVIDER_SITE_OTHER): Payer: Medicare Other | Admitting: *Deleted

## 2016-10-15 DIAGNOSIS — Z7901 Long term (current) use of anticoagulants: Secondary | ICD-10-CM | POA: Diagnosis not present

## 2016-10-15 DIAGNOSIS — Z5181 Encounter for therapeutic drug level monitoring: Secondary | ICD-10-CM | POA: Diagnosis not present

## 2016-10-15 DIAGNOSIS — I4891 Unspecified atrial fibrillation: Secondary | ICD-10-CM

## 2016-10-15 LAB — POCT INR: INR: 2.8

## 2016-10-22 ENCOUNTER — Encounter: Payer: Self-pay | Admitting: Family Medicine

## 2016-10-22 ENCOUNTER — Ambulatory Visit (INDEPENDENT_AMBULATORY_CARE_PROVIDER_SITE_OTHER): Payer: Medicare Other | Admitting: Family Medicine

## 2016-10-22 VITALS — BP 100/70 | HR 78 | Temp 98.3°F | Resp 18 | Ht 70.0 in | Wt 238.0 lb

## 2016-10-22 DIAGNOSIS — J208 Acute bronchitis due to other specified organisms: Secondary | ICD-10-CM | POA: Diagnosis not present

## 2016-10-22 MED ORDER — LEVOFLOXACIN 500 MG PO TABS: 500.0000 mg | ORAL_TABLET | Freq: Every day | ORAL | 0 refills | Status: DC

## 2016-10-22 MED ORDER — PREDNISONE 20 MG PO TABS: ORAL_TABLET | ORAL | 0 refills | Status: DC

## 2016-10-22 NOTE — Progress Notes (Signed)
Subjective:    Patient ID: Kevin Vazquez, male    DOB: Mar 10, 1946, 71 y.o.   MRN: [id number]  HPI Patient has been sick for several weeks. He is developed a constant productive cough productive of thick green mucus. He is audibly wheezing on exam today. He reports shortness of breath particularly worse when he lies flat at night. He reports pleurisy in the center of his back as well as pain in his chest when he coughs. He denies any hemoptysis. He denies any angina. Past Medical History:  Diagnosis Date  . Atrial fibrillation, permanent ([place])   . BPH (benign prostatic hyperplasia)   . Sick sinus syndrome [facility])    Past Surgical History:  Procedure Laterality Date  . CARDIAC CATHETERIZATION  01/26/1997   Normal LV function. Normal coronary arteries.  Marland Kitchen CARDIOVASCULAR STRESS TEST  05/02/2011   Normal perfusion scan demonstrating diaphragmatic artifact. No significant ischemia demonstrated   . LEFT LOWER EXTREMITY VENOUS DOPPLER  02/07/2009   3.8x1.3x2.8cm hypoechoic area in lateral L thigh. No evidence of DVT.  Marland Kitchen PACEMAKER INSERTION  04/24/2010   Medtronic Adapta, model #ADSRO1, serial [id number]  . TRANSTHORACIC ECHOCARDIOGRAM  11/26/2012   EF 50-55%, LA-appendage was moderately dilated   Current Outpatient Prescriptions on File Prior to Visit  Medication Sig Dispense Refill  . diltiazem (CARDIZEM CD) 240 MG 24 hr capsule Take 1 capsule (240 mg total) by mouth daily. 90 capsule 3  . diltiazem (CARDIZEM) 30 MG tablet Per dr taylor,may take additional diltiazem 30 mg daily as needed 90 tablet 1  . psyllium (METAMUCIL) 58.6 % packet Take 1 packet by mouth daily.    . tamsulosin (FLOMAX) 0.4 MG CAPS capsule TAKE 1 CAPSULE EVERY DAY 90 capsule 3  . warfarin (COUMADIN) 5 MG tablet TAKE 1 AND 1/2 TABLETS TO 2 TABLETS DAILY OR AS DIRECTED 180 tablet 3   No current facility-administered medications on file prior to visit.    No Known Allergies Social History   Social History  . Marital status:  Married    Spouse name: N/A  . Number of children: N/A  . Years of education: N/A   Occupational History  . Mechanic Retired    Social History Main Topics  . Smoking status: Never Smoker  . Smokeless tobacco: Former Systems developer    Types: Chew  . Alcohol use 3.5 oz/week    7 Standard drinks or equivalent per week  . Drug use: No  . Sexual activity: Not on file   Other Topics Concern  . Not on file   Social History Narrative  . No narrative on file      Review of Systems  All other systems reviewed and are negative.      Objective:   Physical Exam  Constitutional: He appears well-developed and well-nourished.  Cardiovascular: Normal rate and normal heart sounds.   No murmur heard. Pulmonary/Chest: He has wheezes. He has rales.  Musculoskeletal: He exhibits no edema.  Vitals reviewed.         Assessment & Plan:  Acute bronchitis due to other specified organisms - Plan: levofloxacin (LEVAQUIN) 500 MG tablet, predniSONE (DELTASONE) 20 MG tablet  Patient has severe bronchitis. Begin Levaquin 500 mg by mouth daily for 7 days. This will enhance anticoagulant effect of Coumadin and therefore I will decrease his Coumadin dose to 7.5 mg daily for the next week. Once he is off the antibiotic, he can resume his previous dose of Coumadin. I will also start the patient  on a prednisone taper pack given the wheezing. Patient will call me if he starts to develop tachycardia on the prednisone

## 2016-10-29 ENCOUNTER — Encounter: Payer: Self-pay | Admitting: Family Medicine

## 2016-10-29 ENCOUNTER — Ambulatory Visit (INDEPENDENT_AMBULATORY_CARE_PROVIDER_SITE_OTHER): Payer: Medicare Other | Admitting: Family Medicine

## 2016-10-29 VITALS — BP 122/60 | HR 80 | Temp 97.4°F | Resp 18 | Ht 70.0 in | Wt 241.0 lb

## 2016-10-29 DIAGNOSIS — J208 Acute bronchitis due to other specified organisms: Secondary | ICD-10-CM

## 2016-10-29 NOTE — Progress Notes (Signed)
Subjective:    Patient ID: Kevin Vazquez, male    DOB: 1946/09/24, 71 y.o.   MRN: [id number]  HPI  10/22/16 Patient has been sick for several weeks. He is developed a constant productive cough productive of thick green mucus. He is audibly wheezing on exam today. He reports shortness of breath particularly worse when he lies flat at night. He reports pleurisy in the center of his back as well as pain in his chest when he coughs. He denies any hemoptysis. He denies any angina.  At that time, my plan was: Patient has severe bronchitis. Begin Levaquin 500 mg by mouth daily for 7 days. This will enhance anticoagulant effect of Coumadin and therefore I will decrease his Coumadin dose to 7.5 mg daily for the next week. Once he is off the antibiotic, he can resume his previous dose of Coumadin. I will also start the patient on a prednisone taper pack given the wheezing. Patient will call me if he starts to develop tachycardia on the prednisone  10/29/16 Patient feels much better compared to last week. He is not coughing as much all he continues to have chest congestion and wheezing. He denies any fevers or chills or pleurisy. His shortness of breath has improved. The wheezing is much better today on exam although still present it is very faint. I do not appreciate any crackles or Rales today on exam Past Medical History:  Diagnosis Date  . Atrial fibrillation, permanent ([place])   . BPH (benign prostatic hyperplasia)   . Sick sinus syndrome [facility])    Past Surgical History:  Procedure Laterality Date  . CARDIAC CATHETERIZATION  01/26/1997   Normal LV function. Normal coronary arteries.  Marland Kitchen CARDIOVASCULAR STRESS TEST  05/02/2011   Normal perfusion scan demonstrating diaphragmatic artifact. No significant ischemia demonstrated   . LEFT LOWER EXTREMITY VENOUS DOPPLER  02/07/2009   3.8x1.3x2.8cm hypoechoic area in lateral L thigh. No evidence of DVT.  Marland Kitchen PACEMAKER INSERTION  04/24/2010   Medtronic Adapta, model  #ADSRO1, serial [id number]  . TRANSTHORACIC ECHOCARDIOGRAM  11/26/2012   EF 50-55%, LA-appendage was moderately dilated   Current Outpatient Prescriptions on File Prior to Visit  Medication Sig Dispense Refill  . diltiazem (CARDIZEM CD) 240 MG 24 hr capsule Take 1 capsule (240 mg total) by mouth daily. 90 capsule 3  . diltiazem (CARDIZEM) 30 MG tablet Per dr taylor,may take additional diltiazem 30 mg daily as needed 90 tablet 1  . psyllium (METAMUCIL) 58.6 % packet Take 1 packet by mouth daily.    . tamsulosin (FLOMAX) 0.4 MG CAPS capsule TAKE 1 CAPSULE EVERY DAY 90 capsule 3  . warfarin (COUMADIN) 5 MG tablet TAKE 1 AND 1/2 TABLETS TO 2 TABLETS DAILY OR AS DIRECTED 180 tablet 3   No current facility-administered medications on file prior to visit.    No Known Allergies Social History   Social History  . Marital status: Married    Spouse name: N/A  . Number of children: N/A  . Years of education: N/A   Occupational History  . Mechanic Retired    Social History Main Topics  . Smoking status: Never Smoker  . Smokeless tobacco: Former Systems developer    Types: Chew  . Alcohol use 3.5 oz/week    7 Standard drinks or equivalent per week  . Drug use: No  . Sexual activity: Not on file   Other Topics Concern  . Not on file   Social History Narrative  . No narrative on  file      Review of Systems  All other systems reviewed and are negative.      Objective:   Physical Exam  Constitutional: He appears well-developed and well-nourished.  Cardiovascular: Normal rate and normal heart sounds.   No murmur heard. Pulmonary/Chest: He has wheezes. He has no rales.  Musculoskeletal: He exhibits no edema.  Vitals reviewed.         Assessment & Plan:  Acute bronchitis due to other specified organisms Patient is symptomatically improving. I recommended tincture of time. We will add Symbicort 160/4.5, 2 puffs inhaled twice daily and recheck in one week. If symptoms are not improving at  that time, check a chest x-ray once symptoms improve I'll have the patient discontinue Symbicort

## 2016-11-28 ENCOUNTER — Ambulatory Visit (INDEPENDENT_AMBULATORY_CARE_PROVIDER_SITE_OTHER): Payer: Medicare Other | Admitting: *Deleted

## 2016-11-28 DIAGNOSIS — I4891 Unspecified atrial fibrillation: Secondary | ICD-10-CM | POA: Diagnosis not present

## 2016-11-28 DIAGNOSIS — Z5181 Encounter for therapeutic drug level monitoring: Secondary | ICD-10-CM

## 2016-11-28 DIAGNOSIS — Z7901 Long term (current) use of anticoagulants: Secondary | ICD-10-CM | POA: Diagnosis not present

## 2016-11-28 LAB — POCT INR: INR: 2.5

## 2017-01-14 ENCOUNTER — Ambulatory Visit (INDEPENDENT_AMBULATORY_CARE_PROVIDER_SITE_OTHER): Payer: Medicare Other | Admitting: *Deleted

## 2017-01-14 DIAGNOSIS — I4891 Unspecified atrial fibrillation: Secondary | ICD-10-CM

## 2017-01-14 DIAGNOSIS — Z5181 Encounter for therapeutic drug level monitoring: Secondary | ICD-10-CM | POA: Diagnosis not present

## 2017-01-14 DIAGNOSIS — Z7901 Long term (current) use of anticoagulants: Secondary | ICD-10-CM

## 2017-01-14 LAB — POCT INR: INR: 2.9

## 2017-01-15 ENCOUNTER — Other Ambulatory Visit: Payer: Self-pay | Admitting: Cardiovascular Disease

## 2017-01-24 ENCOUNTER — Other Ambulatory Visit: Payer: Self-pay | Admitting: Family Medicine

## 2017-01-24 MED ORDER — TAMSULOSIN HCL 0.4 MG PO CAPS: 0.4000 mg | ORAL_CAPSULE | Freq: Every day | ORAL | 3 refills | Status: DC

## 2017-02-15 ENCOUNTER — Ambulatory Visit (INDEPENDENT_AMBULATORY_CARE_PROVIDER_SITE_OTHER): Payer: Medicare Other | Admitting: *Deleted

## 2017-02-15 DIAGNOSIS — I482 Chronic atrial fibrillation: Secondary | ICD-10-CM | POA: Diagnosis not present

## 2017-02-15 DIAGNOSIS — I4821 Permanent atrial fibrillation: Secondary | ICD-10-CM

## 2017-02-15 LAB — CUP PACEART INCLINIC DEVICE CHECK
Brady Statistic RV Percent Paced: 17 %
Date Time Interrogation Session: 20180525144127
Implantable Lead Location: 753860
Implantable Lead Model: 5092
Lead Channel Impedance Value: 0 Ohm
Lead Channel Impedance Value: 770 Ohm
Lead Channel Pacing Threshold Amplitude: 1.25 V
Lead Channel Pacing Threshold Pulse Width: 0.46 ms
Lead Channel Setting Sensing Sensitivity: 2.8 mV
MDC IDC LEAD IMPLANT DT: 20020722
MDC IDC MSMT BATTERY IMPEDANCE: 2162 Ohm
MDC IDC MSMT BATTERY REMAINING LONGEVITY: 38 mo
MDC IDC MSMT BATTERY VOLTAGE: 2.74 V
MDC IDC MSMT LEADCHNL RV PACING THRESHOLD AMPLITUDE: 0.75 V
MDC IDC MSMT LEADCHNL RV PACING THRESHOLD PULSEWIDTH: 0.4 ms
MDC IDC MSMT LEADCHNL RV SENSING INTR AMPL: 11.2 mV
MDC IDC PG IMPLANT DT: 20110801
MDC IDC SET LEADCHNL RV PACING AMPLITUDE: 2.5 V
MDC IDC SET LEADCHNL RV PACING PULSEWIDTH: 0.46 ms

## 2017-02-15 NOTE — Progress Notes (Signed)
Pacemaker check in clinic. Normal device function. Threshold, sensing, and impedance consistent with previous measurements. Device programmed to maximize longevity. (27) high ventricular rates noted, max dur. 45sec-AF/RVR per markers/EGMs. Device programmed at appropriate safety margins. Histogram distribution appropriate for patient activity level. Device programmed to optimize intrinsic conduction. Estimated longevity 3 years. Patient will follow up with GT/R in 07-2017.

## 2017-02-25 ENCOUNTER — Ambulatory Visit (INDEPENDENT_AMBULATORY_CARE_PROVIDER_SITE_OTHER): Payer: Medicare Other | Admitting: *Deleted

## 2017-02-25 DIAGNOSIS — Z5181 Encounter for therapeutic drug level monitoring: Secondary | ICD-10-CM

## 2017-02-25 DIAGNOSIS — I4891 Unspecified atrial fibrillation: Secondary | ICD-10-CM | POA: Diagnosis not present

## 2017-02-25 DIAGNOSIS — Z7901 Long term (current) use of anticoagulants: Secondary | ICD-10-CM | POA: Diagnosis not present

## 2017-02-25 LAB — POCT INR: INR: 3.6

## 2017-04-02 ENCOUNTER — Other Ambulatory Visit: Payer: Self-pay | Admitting: Physician Assistant

## 2017-04-02 DIAGNOSIS — L570 Actinic keratosis: Secondary | ICD-10-CM | POA: Diagnosis not present

## 2017-04-02 DIAGNOSIS — L820 Inflamed seborrheic keratosis: Secondary | ICD-10-CM | POA: Diagnosis not present

## 2017-04-02 DIAGNOSIS — C4492 Squamous cell carcinoma of skin, unspecified: Secondary | ICD-10-CM

## 2017-04-02 DIAGNOSIS — D492 Neoplasm of unspecified behavior of bone, soft tissue, and skin: Secondary | ICD-10-CM | POA: Diagnosis not present

## 2017-04-02 DIAGNOSIS — D0439 Carcinoma in situ of skin of other parts of face: Secondary | ICD-10-CM | POA: Diagnosis not present

## 2017-04-02 DIAGNOSIS — C44319 Basal cell carcinoma of skin of other parts of face: Secondary | ICD-10-CM | POA: Diagnosis not present

## 2017-04-02 DIAGNOSIS — C4401 Basal cell carcinoma of skin of lip: Secondary | ICD-10-CM | POA: Diagnosis not present

## 2017-04-02 DIAGNOSIS — C4491 Basal cell carcinoma of skin, unspecified: Secondary | ICD-10-CM

## 2017-04-02 HISTORY — DX: Squamous cell carcinoma of skin, unspecified: C44.92

## 2017-04-02 HISTORY — DX: Basal cell carcinoma of skin, unspecified: C44.91

## 2017-04-08 ENCOUNTER — Ambulatory Visit (INDEPENDENT_AMBULATORY_CARE_PROVIDER_SITE_OTHER): Payer: Medicare Other | Admitting: *Deleted

## 2017-04-08 DIAGNOSIS — I4891 Unspecified atrial fibrillation: Secondary | ICD-10-CM | POA: Diagnosis not present

## 2017-04-08 DIAGNOSIS — Z7901 Long term (current) use of anticoagulants: Secondary | ICD-10-CM | POA: Diagnosis not present

## 2017-04-08 DIAGNOSIS — Z5181 Encounter for therapeutic drug level monitoring: Secondary | ICD-10-CM | POA: Diagnosis not present

## 2017-04-08 LAB — POCT INR: INR: 2.9

## 2017-05-02 ENCOUNTER — Other Ambulatory Visit: Payer: Self-pay | Admitting: Dermatology

## 2017-05-02 DIAGNOSIS — D0439 Carcinoma in situ of skin of other parts of face: Secondary | ICD-10-CM | POA: Diagnosis not present

## 2017-05-02 DIAGNOSIS — L570 Actinic keratosis: Secondary | ICD-10-CM | POA: Diagnosis not present

## 2017-05-02 DIAGNOSIS — D492 Neoplasm of unspecified behavior of bone, soft tissue, and skin: Secondary | ICD-10-CM | POA: Diagnosis not present

## 2017-05-06 ENCOUNTER — Encounter: Payer: Self-pay | Admitting: *Deleted

## 2017-05-29 DIAGNOSIS — L570 Actinic keratosis: Secondary | ICD-10-CM | POA: Diagnosis not present

## 2017-05-29 DIAGNOSIS — D229 Melanocytic nevi, unspecified: Secondary | ICD-10-CM | POA: Diagnosis not present

## 2017-06-12 ENCOUNTER — Ambulatory Visit (INDEPENDENT_AMBULATORY_CARE_PROVIDER_SITE_OTHER): Payer: Medicare Other | Admitting: *Deleted

## 2017-06-12 DIAGNOSIS — Z7901 Long term (current) use of anticoagulants: Secondary | ICD-10-CM | POA: Diagnosis not present

## 2017-06-12 DIAGNOSIS — I4891 Unspecified atrial fibrillation: Secondary | ICD-10-CM | POA: Diagnosis not present

## 2017-06-12 DIAGNOSIS — Z5181 Encounter for therapeutic drug level monitoring: Secondary | ICD-10-CM

## 2017-06-12 LAB — POCT INR: INR: 3.7

## 2017-06-14 ENCOUNTER — Ambulatory Visit (INDEPENDENT_AMBULATORY_CARE_PROVIDER_SITE_OTHER): Payer: Medicare Other | Admitting: Family Medicine

## 2017-06-14 ENCOUNTER — Encounter: Payer: Self-pay | Admitting: Family Medicine

## 2017-06-14 VITALS — BP 108/70 | HR 80 | Temp 97.8°F | Resp 18 | Wt 238.0 lb

## 2017-06-14 DIAGNOSIS — K5752 Diverticulitis of both small and large intestine without perforation or abscess without bleeding: Secondary | ICD-10-CM | POA: Diagnosis not present

## 2017-06-14 DIAGNOSIS — R5383 Other fatigue: Secondary | ICD-10-CM | POA: Diagnosis not present

## 2017-06-14 LAB — URINALYSIS, ROUTINE W REFLEX MICROSCOPIC
Bilirubin Urine: NEGATIVE
GLUCOSE, UA: NEGATIVE
HGB URINE DIPSTICK: NEGATIVE
Ketones, ur: NEGATIVE
LEUKOCYTES UA: NEGATIVE
NITRITE: NEGATIVE
PROTEIN: NEGATIVE
Specific Gravity, Urine: 1.025 (ref 1.001–1.03)
pH: 5.5 (ref 5.0–8.0)

## 2017-06-14 LAB — CBC WITH DIFFERENTIAL/PLATELET
BASOS ABS: 19 {cells}/uL (ref 0–200)
Basophils Relative: 0.4 %
EOS ABS: 192 {cells}/uL (ref 15–500)
Eosinophils Relative: 4 %
HEMATOCRIT: 42.7 % (ref 38.5–50.0)
Hemoglobin: 14.6 g/dL (ref 13.2–17.1)
LYMPHS ABS: 1238 {cells}/uL (ref 850–3900)
MCH: 30.5 pg (ref 27.0–33.0)
MCHC: 34.2 g/dL (ref 32.0–36.0)
MCV: 89.3 fL (ref 80.0–100.0)
MPV: 10.2 fL (ref 7.5–12.5)
Monocytes Relative: 9 %
NEUTROS PCT: 60.8 %
Neutro Abs: 2918 cells/uL (ref 1500–7800)
PLATELETS: 224 10*3/uL (ref 140–400)
RBC: 4.78 10*6/uL (ref 4.20–5.80)
RDW: 13.5 % (ref 11.0–15.0)
Total Lymphocyte: 25.8 %
WBC: 4.8 10*3/uL (ref 3.8–10.8)
WBCMIX: 432 {cells}/uL (ref 200–950)

## 2017-06-14 LAB — COMPLETE METABOLIC PANEL WITH GFR
AG Ratio: 1.9 (calc) (ref 1.0–2.5)
ALBUMIN MSPROF: 3.9 g/dL (ref 3.6–5.1)
ALKALINE PHOSPHATASE (APISO): 57 U/L (ref 40–115)
ALT: 13 U/L (ref 9–46)
AST: 17 U/L (ref 10–35)
BILIRUBIN TOTAL: 0.5 mg/dL (ref 0.2–1.2)
BUN / CREAT RATIO: 13 (calc) (ref 6–22)
BUN: 16 mg/dL (ref 7–25)
CO2: 27 mmol/L (ref 20–32)
CREATININE: 1.27 mg/dL — AB (ref 0.70–1.18)
Calcium: 8.7 mg/dL (ref 8.6–10.3)
Chloride: 105 mmol/L (ref 98–110)
GFR, Est African American: 65 mL/min/{1.73_m2} (ref >=60)
GFR, Est Non African American: 56 mL/min/{1.73_m2} — ABNORMAL LOW (ref >=60)
GLUCOSE: 91 mg/dL (ref 65–99)
Globulin: 2.1 g/dL (calc) (ref 1.9–3.7)
Potassium: 4.8 mmol/L (ref 3.5–5.3)
Sodium: 138 mmol/L (ref 135–146)
TOTAL PROTEIN: 6 g/dL — AB (ref 6.1–8.1)

## 2017-06-14 MED ORDER — METRONIDAZOLE 500 MG PO TABS: 500.0000 mg | ORAL_TABLET | Freq: Two times a day (BID) | ORAL | 0 refills | Status: DC

## 2017-06-14 MED ORDER — CIPROFLOXACIN HCL 500 MG PO TABS: 500.0000 mg | ORAL_TABLET | Freq: Two times a day (BID) | ORAL | 0 refills | Status: DC

## 2017-06-14 NOTE — Progress Notes (Signed)
Subjective:    Patient ID: Kevin Vazquez, male    DOB: 11-07-1945, 71 y.o.   MRN: [id number]  HPI Patient states that over the last 2-3 weeks, patient has developed increasing pain in his lower abdomen. It is constant. It is now centered in the left lower quadrant. It is mild to moderate but is gradually getting worse. He denies any melena or hematochezia although he has been having constipation. He is also having low-grade fevers. He denies any nausea or vomiting but he does report bloating and feeling full. Urinalysis is unremarkable. He denies any dysuria or hematuria or urgency or frequency. He does report weak urinary stream Past Medical History:  Diagnosis Date  . Atrial fibrillation, permanent ([place])   . BPH (benign prostatic hyperplasia)   . Sick sinus syndrome [facility])    Past Surgical History:  Procedure Laterality Date  . CARDIAC CATHETERIZATION  01/26/1997   Normal LV function. Normal coronary arteries.  Marland Kitchen CARDIOVASCULAR STRESS TEST  05/02/2011   Normal perfusion scan demonstrating diaphragmatic artifact. No significant ischemia demonstrated   . LEFT LOWER EXTREMITY VENOUS DOPPLER  02/07/2009   3.8x1.3x2.8cm hypoechoic area in lateral L thigh. No evidence of DVT.  Marland Kitchen PACEMAKER INSERTION  04/24/2010   Medtronic Adapta, model #ADSRO1, serial [id number]  . TRANSTHORACIC ECHOCARDIOGRAM  11/26/2012   EF 50-55%, LA-appendage was moderately dilated   Current Outpatient Prescriptions on File Prior to Visit  Medication Sig Dispense Refill  . diltiazem (CARDIZEM CD) 240 MG 24 hr capsule Take 1 capsule (240 mg total) by mouth daily. 90 capsule 3  . diltiazem (CARDIZEM) 30 MG tablet Per dr taylor,may take additional diltiazem 30 mg daily as needed 90 tablet 1  . psyllium (METAMUCIL) 58.6 % packet Take 1 packet by mouth daily.    . tamsulosin (FLOMAX) 0.4 MG CAPS capsule Take 1 capsule (0.4 mg total) by mouth daily. 90 capsule 3  . warfarin (COUMADIN) 5 MG tablet TAKE 1 AND 1/2 TABLETS TO 2  TABLETS DAILY OR AS DIRECTED 180 tablet 3   No current facility-administered medications on file prior to visit.    No Known Allergies Social History   Social History  . Marital status: Married    Spouse name: N/A  . Number of children: N/A  . Years of education: N/A   Occupational History  . Mechanic Retired    Social History Main Topics  . Smoking status: Never Smoker  . Smokeless tobacco: Former Systems developer    Types: Chew  . Alcohol use 3.5 oz/week    7 Standard drinks or equivalent per week  . Drug use: No  . Sexual activity: Not on file   Other Topics Concern  . Not on file   Social History Narrative  . No narrative on file      Review of Systems  All other systems reviewed and are negative.      Objective:   Physical Exam  Constitutional: He appears well-developed and well-nourished. No distress.  Cardiovascular: Normal rate and normal heart sounds.   No murmur heard. Pulmonary/Chest: Effort normal and breath sounds normal. No respiratory distress. He has no wheezes. He has no rales.  Abdominal: Soft. Bowel sounds are normal. He exhibits no distension. There is tenderness. There is no rebound.  Skin: He is not diaphoretic.  Vitals reviewed.         Assessment & Plan:  Fatigue, unspecified type - Plan: Urinalysis, Routine w reflex microscopic  Diverticulitis of both small and large intestine  without perforation or abscess without bleeding - Plan: CBC with Differential/Platelet, COMPLETE METABOLIC PANEL WITH GFR, ciprofloxacin (CIPRO) 500 MG tablet, metroNIDAZOLE (FLAGYL) 500 MG tablet, DISCONTINUED: ciprofloxacin (CIPRO) 500 MG tablet, DISCONTINUED: metroNIDAZOLE (FLAGYL) 500 MG tablet  Patient is tender to palpation in the left lower quadrant. I'm concerned these developing diverticulitis. Also on the differential diagnosis is constipation versus atypical prostatitis. However I cannot explain the fever with this constipation. Therefore I will empirically  treat the patient for diverticulitis with Cipro 500 mg by mouth twice a day for 10 days and Flagyl 500 mg by mouth twice a day for 10 days. Unfortunately these were also impact his Coumadin. They will actually make his blood even thinner than it is now. Therefore I have recommended that he decrease his dose of Coumadin to 7-1/2 mg every day except Monday Wednesday Friday and on Monday Wednesday Friday he will take 5 mg. I recommended that we recheck his INR here next week and make further adjustments based on his value at that time

## 2017-06-17 ENCOUNTER — Ambulatory Visit: Payer: No Typology Code available for payment source | Admitting: Family Medicine

## 2017-06-21 ENCOUNTER — Encounter: Payer: Self-pay | Admitting: Family Medicine

## 2017-06-21 ENCOUNTER — Ambulatory Visit (INDEPENDENT_AMBULATORY_CARE_PROVIDER_SITE_OTHER): Payer: Medicare Other | Admitting: Family Medicine

## 2017-06-21 VITALS — BP 98/68 | HR 68 | Temp 97.6°F | Resp 16 | Ht 70.0 in | Wt 238.0 lb

## 2017-06-21 DIAGNOSIS — K5752 Diverticulitis of both small and large intestine without perforation or abscess without bleeding: Secondary | ICD-10-CM

## 2017-06-21 DIAGNOSIS — Z23 Encounter for immunization: Secondary | ICD-10-CM

## 2017-06-21 DIAGNOSIS — I482 Chronic atrial fibrillation, unspecified: Secondary | ICD-10-CM

## 2017-06-21 DIAGNOSIS — N410 Acute prostatitis: Secondary | ICD-10-CM

## 2017-06-21 DIAGNOSIS — Z5181 Encounter for therapeutic drug level monitoring: Secondary | ICD-10-CM | POA: Diagnosis not present

## 2017-06-21 DIAGNOSIS — R5383 Other fatigue: Secondary | ICD-10-CM

## 2017-06-21 DIAGNOSIS — Z125 Encounter for screening for malignant neoplasm of prostate: Secondary | ICD-10-CM | POA: Diagnosis not present

## 2017-06-21 LAB — PT WITH INR/FINGERSTICK
INR FINGERSTICK: 2.3 ratio — AB
PT FINGERSTICK: 28.2 s — AB (ref 10.5–13.1)

## 2017-06-21 NOTE — Addendum Note (Signed)
Addended by: Shary Decamp B on: 06/21/2017 09:50 AM   Modules accepted: Orders

## 2017-06-21 NOTE — Progress Notes (Signed)
Subjective:    Patient ID: Kevin Vazquez, male    DOB: 02-Jul-1946, 71 y.o.   MRN: [id number]  HPI  06/14/17 Patient states that over the last 2-3 weeks, patient has developed increasing pain in his lower abdomen. It is constant. It is now centered in the left lower quadrant. It is mild to moderate but is gradually getting worse. He denies any melena or hematochezia although he has been having constipation. He is also having low-grade fevers. He denies any nausea or vomiting but he does report bloating and feeling full. Urinalysis is unremarkable. He denies any dysuria or hematuria or urgency or frequency. He does report weak urinary stream.  At that time, my plan was: Patient is tender to palpation in the left lower quadrant. I'm concerned these developing diverticulitis. Also on the differential diagnosis is constipation versus atypical prostatitis. However I cannot explain the fever with this constipation. Therefore I will empirically treat the patient for diverticulitis with Cipro 500 mg by mouth twice a day for 10 days and Flagyl 500 mg by mouth twice a day for 10 days. Unfortunately these were also impact his Coumadin. They will actually make his blood even thinner than it is now. Therefore I have recommended that he decrease his dose of Coumadin to 7-1/2 mg every day except Monday Wednesday Friday and on Monday Wednesday Friday he will take 5 mg. I recommended that we recheck his INR here next week and make further adjustments based on his value at that time  06/21/17 Patient states that he feels some better. He would estimate 40-50%. He continues to have suprapubic discomfort. This is made worse by hitting a bump when he drives, etc. Pain is now centered over his bladder. However he denies any dysuria. He denies any urgency. He denies any frequency. He denies any constipation. In fact he is having diarrhea on the lens OS. Defecation does not help the discomfort. He denies any hematuria. He  denies any fevers or chills. He denies any melena or hematochezia. He does report a weak stream and occasional nocturia but this is not changed greatly recently. He continues to have fatigue and generally feeling "sick, and slightly nauseous". Lab work last time was unrevealing aside from mildly elevated creatinine Past Medical History:  Diagnosis Date  . Atrial fibrillation, permanent ([place])   . BPH (benign prostatic hyperplasia)   . Sick sinus syndrome [facility])    Past Surgical History:  Procedure Laterality Date  . CARDIAC CATHETERIZATION  01/26/1997   Normal LV function. Normal coronary arteries.  Marland Kitchen CARDIOVASCULAR STRESS TEST  05/02/2011   Normal perfusion scan demonstrating diaphragmatic artifact. No significant ischemia demonstrated   . LEFT LOWER EXTREMITY VENOUS DOPPLER  02/07/2009   3.8x1.3x2.8cm hypoechoic area in lateral L thigh. No evidence of DVT.  Marland Kitchen PACEMAKER INSERTION  04/24/2010   Medtronic Adapta, model #ADSRO1, serial [id number]  . TRANSTHORACIC ECHOCARDIOGRAM  11/26/2012   EF 50-55%, LA-appendage was moderately dilated   Current Outpatient Prescriptions on File Prior to Visit  Medication Sig Dispense Refill  . ciprofloxacin (CIPRO) 500 MG tablet Take 1 tablet (500 mg total) by mouth 2 (two) times daily. 20 tablet 0  . diltiazem (CARDIZEM CD) 240 MG 24 hr capsule Take 1 capsule (240 mg total) by mouth daily. 90 capsule 3  . diltiazem (CARDIZEM) 30 MG tablet Per dr taylor,may take additional diltiazem 30 mg daily as needed 90 tablet 1  . metroNIDAZOLE (FLAGYL) 500 MG tablet Take 1 tablet (  500 mg total) by mouth 2 (two) times daily. 20 tablet 0  . psyllium (METAMUCIL) 58.6 % packet Take 1 packet by mouth daily.    . tamsulosin (FLOMAX) 0.4 MG CAPS capsule Take 1 capsule (0.4 mg total) by mouth daily. 90 capsule 3  . warfarin (COUMADIN) 5 MG tablet TAKE 1 AND 1/2 TABLETS TO 2 TABLETS DAILY OR AS DIRECTED 180 tablet 3   No current facility-administered medications on file prior to  visit.    No Known Allergies Social History   Social History  . Marital status: Married    Spouse name: N/A  . Number of children: N/A  . Years of education: N/A   Occupational History  . Mechanic Retired    Social History Main Topics  . Smoking status: Never Smoker  . Smokeless tobacco: Former Systems developer    Types: Chew  . Alcohol use 3.5 oz/week    7 Standard drinks or equivalent per week  . Drug use: No  . Sexual activity: Not on file   Other Topics Concern  . Not on file   Social History Narrative  . No narrative on file      Review of Systems  All other systems reviewed and are negative.      Objective:   Physical Exam  Constitutional: He appears well-developed and well-nourished. No distress.  Cardiovascular: Normal rate and normal heart sounds.   No murmur heard. Pulmonary/Chest: Effort normal and breath sounds normal. No respiratory distress. He has no wheezes. He has no rales.  Abdominal: Soft. Bowel sounds are normal. He exhibits no distension. There is tenderness. There is no rebound.  Skin: He is not diaphoretic.  Vitals reviewed.         Assessment & Plan:  Lower abdominal pain At this point, I am unsure of the diagnosis. He is 50% better which makes me believe there are some type of infection. Diverticulitis is certainly on the differential diagnosis. Subacute prostatitis would also be on the differential diagnosis. I performed a prostate exam today and the prostate is mildly edematous and swollen but not particularly tender. Anal sphincter is tight but there is no perirectal mass. The remainder of his exam is nonspecific for any particular cause. At this point, I believe there may be an element of prostatitis versus incomplete treatment of diverticulitis.  INR is therapeutic at 2.3. Continue my recommendations on his Coumadin dose until next week. After he has completed all of his antibiotics, I want him to resume his previous dose recommended by his  cardiologist. Elizebeth Koller Cipro and Flagyl. Recheck via telephone on Monday or Tuesday. If not 100% better, I will switch the patient to Bactrim to cover for prostatitis. If not improving at that point, I will proceed with a CT scan of the abdomen and pelvis to evaluate further.

## 2017-06-22 LAB — CBC WITH DIFFERENTIAL/PLATELET
BASOS PCT: 0.6 %
Basophils Absolute: 29 cells/uL (ref 0–200)
EOS ABS: 182 {cells}/uL (ref 15–500)
EOS PCT: 3.8 %
HCT: 45 % (ref 38.5–50.0)
Hemoglobin: 15.4 g/dL (ref 13.2–17.1)
Lymphs Abs: 1224 cells/uL (ref 850–3900)
MCH: 30.1 pg (ref 27.0–33.0)
MCHC: 34.2 g/dL (ref 32.0–36.0)
MCV: 87.9 fL (ref 80.0–100.0)
MONOS PCT: 10.7 %
MPV: 10.3 fL (ref 7.5–12.5)
NEUTROS ABS: 2851 {cells}/uL (ref 1500–7800)
Neutrophils Relative %: 59.4 %
PLATELETS: 222 10*3/uL (ref 140–400)
RBC: 5.12 10*6/uL (ref 4.20–5.80)
RDW: 13.6 % (ref 11.0–15.0)
TOTAL LYMPHOCYTE: 25.5 %
WBC mixed population: 514 cells/uL (ref 200–950)
WBC: 4.8 10*3/uL (ref 3.8–10.8)

## 2017-06-22 LAB — COMPLETE METABOLIC PANEL WITH GFR
AG Ratio: 2.1 (calc) (ref 1.0–2.5)
ALBUMIN MSPROF: 4.1 g/dL (ref 3.6–5.1)
ALT: 20 U/L (ref 9–46)
AST: 23 U/L (ref 10–35)
Alkaline phosphatase (APISO): 56 U/L (ref 40–115)
BUN/Creatinine Ratio: 16 (calc) (ref 6–22)
BUN: 21 mg/dL (ref 7–25)
CO2: 22 mmol/L (ref 20–32)
CREATININE: 1.3 mg/dL — AB (ref 0.70–1.18)
Calcium: 8.9 mg/dL (ref 8.6–10.3)
Chloride: 107 mmol/L (ref 98–110)
GFR, EST AFRICAN AMERICAN: 64 mL/min/{1.73_m2} (ref >=60)
GFR, Est Non African American: 55 mL/min/{1.73_m2} — ABNORMAL LOW (ref >=60)
GLUCOSE: 80 mg/dL (ref 65–99)
Globulin: 2 g/dL (calc) (ref 1.9–3.7)
Potassium: 4.6 mmol/L (ref 3.5–5.3)
Sodium: 140 mmol/L (ref 135–146)
TOTAL PROTEIN: 6.1 g/dL (ref 6.1–8.1)
Total Bilirubin: 0.4 mg/dL (ref 0.2–1.2)

## 2017-06-22 LAB — PSA: PSA: 2.1 ng/mL (ref <=4.0)

## 2017-06-22 LAB — SPECIMEN COMPROMISED

## 2017-07-03 ENCOUNTER — Ambulatory Visit (INDEPENDENT_AMBULATORY_CARE_PROVIDER_SITE_OTHER): Payer: Medicare Other | Admitting: *Deleted

## 2017-07-03 DIAGNOSIS — Z7901 Long term (current) use of anticoagulants: Secondary | ICD-10-CM

## 2017-07-03 DIAGNOSIS — Z5181 Encounter for therapeutic drug level monitoring: Secondary | ICD-10-CM | POA: Diagnosis not present

## 2017-07-03 DIAGNOSIS — I4891 Unspecified atrial fibrillation: Secondary | ICD-10-CM

## 2017-07-03 LAB — POCT INR: INR: 2.3

## 2017-07-22 ENCOUNTER — Ambulatory Visit (INDEPENDENT_AMBULATORY_CARE_PROVIDER_SITE_OTHER): Payer: Medicare Other | Admitting: *Deleted

## 2017-07-22 DIAGNOSIS — Z5181 Encounter for therapeutic drug level monitoring: Secondary | ICD-10-CM | POA: Diagnosis not present

## 2017-07-22 DIAGNOSIS — I4891 Unspecified atrial fibrillation: Secondary | ICD-10-CM | POA: Diagnosis not present

## 2017-07-22 DIAGNOSIS — Z7901 Long term (current) use of anticoagulants: Secondary | ICD-10-CM | POA: Diagnosis not present

## 2017-07-22 LAB — POCT INR: INR: 2.5

## 2017-08-09 ENCOUNTER — Ambulatory Visit (INDEPENDENT_AMBULATORY_CARE_PROVIDER_SITE_OTHER): Payer: Medicare Other | Admitting: Internal Medicine

## 2017-08-09 ENCOUNTER — Encounter: Payer: Self-pay | Admitting: Internal Medicine

## 2017-08-09 VITALS — BP 106/70 | HR 85 | Ht 70.0 in | Wt 239.0 lb

## 2017-08-09 DIAGNOSIS — I482 Chronic atrial fibrillation: Secondary | ICD-10-CM

## 2017-08-09 DIAGNOSIS — Z950 Presence of cardiac pacemaker: Secondary | ICD-10-CM | POA: Diagnosis not present

## 2017-08-09 DIAGNOSIS — I4821 Permanent atrial fibrillation: Secondary | ICD-10-CM

## 2017-08-09 NOTE — Patient Instructions (Signed)
Medication Instructions:  Your physician recommends that you continue on your current medications as directed. Please refer to the Current Medication list given to you today.  Labwork: NONE   Testing/Procedures: NONE   Follow-Up: Your physician wants you to follow-up in: 1 Year with Dr. Lovena Le. You will receive a reminder letter in the mail two months in advance. If you don't receive a letter, please call our office to schedule the follow-up appointment.  Your physician recommends that you schedule a follow-up appointment with [facility] in 6 Months.   Any Other Special Instructions Will Be Listed Below (If Applicable).     If you need a refill on your cardiac medications before your next appointment, please call your pharmacy. Thank you for choosing [facility]!

## 2017-08-09 NOTE — Progress Notes (Signed)
HPI [name] returns today for ongoing evaluation and management of his pacemaker and atrial fibrillation. He is a very pleasant 71 year old man with chronic atrial fibrillation and intermittent complete heart block, status post permanent pacemaker insertion. In the past he has been bothered by rapid ventricular response and with Cardizem, his ventricular rates have been under much better control. He denies chest pain, shortness of breath, syncope, or peripheral edema. He admits to dietary indiscretion. 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 Fortunately he has not gained much weight. No Known Allergies   Current Outpatient Medications  Medication Sig Dispense Refill  . ciprofloxacin (CIPRO) 500 MG tablet Take 1 tablet (500 mg total) by mouth 2 (two) times daily. 20 tablet 0  . diltiazem (CARDIZEM CD) 240 MG 24 hr capsule Take 1 capsule (240 mg total) by mouth daily. 90 capsule 3  . diltiazem (CARDIZEM) 30 MG tablet Per dr taylor,may take additional diltiazem 30 mg daily as needed 90 tablet 1  . metroNIDAZOLE (FLAGYL) 500 MG tablet Take 1 tablet (500 mg total) by mouth 2 (two) times daily. 20 tablet 0  . psyllium (METAMUCIL) 58.6 % packet Take 1 packet by mouth daily.    . tamsulosin (FLOMAX) 0.4 MG CAPS capsule Take 1 capsule (0.4 mg total) by mouth daily. 90 capsule 3  . warfarin (COUMADIN) 5 MG tablet TAKE 1 AND 1/2 TABLETS TO 2 TABLETS DAILY OR AS DIRECTED 180 tablet 3   No current facility-administered medications for this visit.      Past Medical History:  Diagnosis Date  . Atrial fibrillation, permanent ([place])   . BPH (benign prostatic hyperplasia)   . Sick sinus syndrome ([facility])     ROS:   All systems reviewed and negative except as noted in the HPI.   Past Surgical History:  Procedure Laterality Date  . CARDIAC CATHETERIZATION  01/26/1997   Normal LV function. Normal coronary arteries.  Marland Kitchen CARDIOVASCULAR STRESS TEST  05/02/2011   Normal perfusion scan demonstrating  diaphragmatic artifact. No significant ischemia demonstrated   . LEFT LOWER EXTREMITY VENOUS DOPPLER  02/07/2009   3.8x1.3x2.8cm hypoechoic area in lateral L thigh. No evidence of DVT.  Marland Kitchen PACEMAKER INSERTION  04/24/2010   Medtronic Adapta, model #ADSRO1, serial [id number]  . TRANSTHORACIC ECHOCARDIOGRAM  11/26/2012   EF 50-55%, LA-appendage was moderately dilated     Family History  Problem Relation Age of Onset  . Heart disease Unknown      Social History   Socioeconomic History  . Marital status: Married    Spouse name: Not on file  . Number of children: Not on file  . Years of education: Not on file  . Highest education level: Not on file  Social Needs  . Financial resource strain: Not on file  . Food insecurity - worry: Not on file  . Food insecurity - inability: Not on file  . Transportation needs - medical: Not on file  . Transportation needs - non-medical: Not on file  Occupational History  . Occupation: Dealer Retired  Tobacco Use  . Smoking status: Never Smoker  . Smokeless tobacco: Former Systems developer    Types: Chew  Substance and Sexual Activity  . Alcohol use: Yes    Alcohol/week: 3.5 oz    Types: 7 Standard drinks or equivalent per week  . Drug use: No  . Sexual activity: Not on file  Other Topics Concern  . Not on file  Social History Narrative  . Not on  file     BP 106/70   Pulse 85   Ht 5\' 10"  (1.778 m)   Wt 239 lb (108.4 kg)   SpO2 96%   BMI 34.29 kg/m   Physical Exam:  Well appearing NAD HEENT: Unremarkable Neck:  6 cm JVD, no thyromegally Lymphatics:  No adenopathy Back:  No CVA tenderness Lungs:  Clear, with no wheezes, rales, or rhonchi. HEART:  Regular rate rhythm, no murmurs, no rubs, no clicks Abd:  soft, positive bowel sounds, no organomegally, no rebound, no guarding Ext:  2 plus pulses, no edema, no cyanosis, no clubbing Skin:  No rashes no nodules Neuro:  CN II through XII intact, motor grossly intact  EKG - atrial fibrillation  with intermittent ventricular pacing.  DEVICE  Normal device function.  See PaceArt for details.   Assess/Plan: 1. Chronic atrial fibrillation - his ventricular rate is mostly well controlled. He has rare high rates which are asymptomatic. He will continue his current medical therapy. 2. Pacemaker - his Medtronic single-chamber pacemaker is working normally. We'll plan to recheck in several months. 3. Anticoagulation - he is tolerating warfarin with no complications.  Cristopher Peru, M.D.

## 2017-08-14 ENCOUNTER — Other Ambulatory Visit: Payer: Self-pay | Admitting: Internal Medicine

## 2017-08-26 ENCOUNTER — Ambulatory Visit (INDEPENDENT_AMBULATORY_CARE_PROVIDER_SITE_OTHER): Payer: Medicare Other | Admitting: *Deleted

## 2017-08-26 DIAGNOSIS — Z7901 Long term (current) use of anticoagulants: Secondary | ICD-10-CM

## 2017-08-26 DIAGNOSIS — Z5181 Encounter for therapeutic drug level monitoring: Secondary | ICD-10-CM | POA: Diagnosis not present

## 2017-08-26 DIAGNOSIS — I4891 Unspecified atrial fibrillation: Secondary | ICD-10-CM

## 2017-08-26 LAB — POCT INR: INR: 2.3

## 2017-09-30 ENCOUNTER — Ambulatory Visit (INDEPENDENT_AMBULATORY_CARE_PROVIDER_SITE_OTHER): Payer: Medicare Other | Admitting: *Deleted

## 2017-09-30 DIAGNOSIS — I4891 Unspecified atrial fibrillation: Secondary | ICD-10-CM

## 2017-09-30 DIAGNOSIS — Z5181 Encounter for therapeutic drug level monitoring: Secondary | ICD-10-CM | POA: Diagnosis not present

## 2017-09-30 LAB — POCT INR: INR: 2.2

## 2017-09-30 NOTE — Patient Instructions (Signed)
Continue coumadin 1 1/2 tablets daily  Recheck in 6 weeks 

## 2017-10-03 ENCOUNTER — Other Ambulatory Visit: Payer: Self-pay | Admitting: Family Medicine

## 2017-10-28 ENCOUNTER — Other Ambulatory Visit: Payer: Self-pay | Admitting: Internal Medicine

## 2017-10-29 ENCOUNTER — Encounter: Payer: Self-pay | Admitting: Gastroenterology

## 2017-11-12 ENCOUNTER — Encounter: Payer: Self-pay | Admitting: *Deleted

## 2017-11-13 ENCOUNTER — Ambulatory Visit (INDEPENDENT_AMBULATORY_CARE_PROVIDER_SITE_OTHER): Payer: Medicare Other | Admitting: *Deleted

## 2017-11-13 DIAGNOSIS — Z7901 Long term (current) use of anticoagulants: Secondary | ICD-10-CM | POA: Diagnosis not present

## 2017-11-13 DIAGNOSIS — I4891 Unspecified atrial fibrillation: Secondary | ICD-10-CM | POA: Diagnosis not present

## 2017-11-13 DIAGNOSIS — Z5181 Encounter for therapeutic drug level monitoring: Secondary | ICD-10-CM

## 2017-11-13 LAB — POCT INR: INR: 2.1

## 2017-11-13 NOTE — Patient Instructions (Signed)
Continue coumadin 1 1/2 tablets daily  Recheck in 6 weeks 

## 2017-12-25 ENCOUNTER — Ambulatory Visit (INDEPENDENT_AMBULATORY_CARE_PROVIDER_SITE_OTHER): Payer: Medicare Other | Admitting: *Deleted

## 2017-12-25 DIAGNOSIS — I4891 Unspecified atrial fibrillation: Secondary | ICD-10-CM

## 2017-12-25 DIAGNOSIS — Z5181 Encounter for therapeutic drug level monitoring: Secondary | ICD-10-CM

## 2017-12-25 DIAGNOSIS — Z7901 Long term (current) use of anticoagulants: Secondary | ICD-10-CM

## 2017-12-25 LAB — POCT INR: INR: 2

## 2017-12-25 NOTE — Patient Instructions (Signed)
Continue coumadin 1 1/2 tablets daily  Recheck in 6 weeks 

## 2018-02-05 ENCOUNTER — Ambulatory Visit (INDEPENDENT_AMBULATORY_CARE_PROVIDER_SITE_OTHER): Payer: Medicare Other | Admitting: *Deleted

## 2018-02-05 DIAGNOSIS — Z5181 Encounter for therapeutic drug level monitoring: Secondary | ICD-10-CM

## 2018-02-05 DIAGNOSIS — Z7901 Long term (current) use of anticoagulants: Secondary | ICD-10-CM | POA: Diagnosis not present

## 2018-02-05 DIAGNOSIS — I4891 Unspecified atrial fibrillation: Secondary | ICD-10-CM

## 2018-02-05 LAB — POCT INR: INR: 2.4

## 2018-02-05 NOTE — Patient Instructions (Signed)
Continue coumadin 1 1/2 tablets daily  Recheck in 6 weeks 

## 2018-02-21 ENCOUNTER — Telehealth: Payer: Self-pay | Admitting: *Deleted

## 2018-02-21 NOTE — Telephone Encounter (Signed)
Spoke with patient to move [facility] appointment to 6/27 as we will not have a Device nurse there on 6/5.  Patient is agreeable to moving appointment to 03/20/18 at 2:30pm.  Appt calendar mailed today.  Patient is appreciative and denies additional questions or concerns at this time.

## 2018-03-19 ENCOUNTER — Ambulatory Visit (INDEPENDENT_AMBULATORY_CARE_PROVIDER_SITE_OTHER): Payer: Medicare Other | Admitting: *Deleted

## 2018-03-19 DIAGNOSIS — Z7901 Long term (current) use of anticoagulants: Secondary | ICD-10-CM

## 2018-03-19 DIAGNOSIS — Z5181 Encounter for therapeutic drug level monitoring: Secondary | ICD-10-CM

## 2018-03-19 DIAGNOSIS — I4891 Unspecified atrial fibrillation: Secondary | ICD-10-CM | POA: Diagnosis not present

## 2018-03-19 LAB — POCT INR: INR: 2.1 (ref 2.0–3.0)

## 2018-03-19 NOTE — Patient Instructions (Signed)
Continue coumadin 1 1/2 tablets daily  Recheck in 6 weeks 

## 2018-03-20 ENCOUNTER — Ambulatory Visit (INDEPENDENT_AMBULATORY_CARE_PROVIDER_SITE_OTHER): Payer: Medicare Other | Admitting: *Deleted

## 2018-03-20 DIAGNOSIS — I482 Chronic atrial fibrillation: Secondary | ICD-10-CM

## 2018-03-20 DIAGNOSIS — Z950 Presence of cardiac pacemaker: Secondary | ICD-10-CM | POA: Diagnosis not present

## 2018-03-20 DIAGNOSIS — I4821 Permanent atrial fibrillation: Secondary | ICD-10-CM

## 2018-03-20 LAB — CUP PACEART INCLINIC DEVICE CHECK
Battery Impedance: 2923 Ohm
Date Time Interrogation Session: 20190627145613
Implantable Lead Model: 5092
Implantable Pulse Generator Implant Date: 20110801
Lead Channel Impedance Value: 0 Ohm
Lead Channel Impedance Value: 827 Ohm
Lead Channel Pacing Threshold Pulse Width: 0.4 ms
Lead Channel Sensing Intrinsic Amplitude: 8 mV
Lead Channel Setting Pacing Pulse Width: 0.4 ms
MDC IDC LEAD IMPLANT DT: 20020722
MDC IDC LEAD LOCATION: 753860
MDC IDC MSMT BATTERY REMAINING LONGEVITY: 27 mo
MDC IDC MSMT BATTERY VOLTAGE: 2.72 V
MDC IDC MSMT LEADCHNL RV PACING THRESHOLD AMPLITUDE: 1 V
MDC IDC SET LEADCHNL RV PACING AMPLITUDE: 2.5 V
MDC IDC SET LEADCHNL RV SENSING SENSITIVITY: 2.8 mV
MDC IDC STAT BRADY RV PERCENT PACED: 15 %

## 2018-03-20 NOTE — Progress Notes (Addendum)
Pacemaker check in clinic. Normal device function. Thresholds, sensing, impedances consistent with previous measurements. Device programmed to maximize longevity. Permanent AF +warfarin. 159 high ventricular rates noted--likely AF w/RVR per markers/EGMs, longest 74min 21sec, patient asymptomatic. Device programmed at appropriate safety margins; reprogrammed RV lead monitor to adaptive, max impedance to 2000ohms per protocol. Histogram distribution shows right-shift, patient is aware he can take PRN diltiazem if symptomatic. Patient reports he stopped taking his Cardizem CD 240mg  awhile ago as he felt it was causing him to feel poorly. Reports he feels much better taking daily 30mg  diltiazem with additional 30mg  daily PRN. Device programmed to optimize intrinsic conduction. Estimated longevity 2 years. Patient declines remote monitoring. Patient education completed. ROV with GT/R in 07/2018.

## 2018-04-11 ENCOUNTER — Other Ambulatory Visit: Payer: Self-pay | Admitting: Physician Assistant

## 2018-04-11 DIAGNOSIS — D0439 Carcinoma in situ of skin of other parts of face: Secondary | ICD-10-CM | POA: Diagnosis not present

## 2018-04-11 DIAGNOSIS — L719 Rosacea, unspecified: Secondary | ICD-10-CM | POA: Diagnosis not present

## 2018-04-11 DIAGNOSIS — D0462 Carcinoma in situ of skin of left upper limb, including shoulder: Secondary | ICD-10-CM | POA: Diagnosis not present

## 2018-04-11 DIAGNOSIS — L570 Actinic keratosis: Secondary | ICD-10-CM | POA: Diagnosis not present

## 2018-04-30 ENCOUNTER — Ambulatory Visit (INDEPENDENT_AMBULATORY_CARE_PROVIDER_SITE_OTHER): Payer: Medicare Other | Admitting: *Deleted

## 2018-04-30 DIAGNOSIS — I4891 Unspecified atrial fibrillation: Secondary | ICD-10-CM

## 2018-04-30 DIAGNOSIS — Z7901 Long term (current) use of anticoagulants: Secondary | ICD-10-CM | POA: Diagnosis not present

## 2018-04-30 DIAGNOSIS — Z5181 Encounter for therapeutic drug level monitoring: Secondary | ICD-10-CM

## 2018-04-30 LAB — POCT INR: INR: 2.1 (ref 2.0–3.0)

## 2018-04-30 NOTE — Patient Instructions (Signed)
Continue coumadin 1 1/2 tablets daily  Recheck in 6 weeks 

## 2018-05-22 DIAGNOSIS — D0439 Carcinoma in situ of skin of other parts of face: Secondary | ICD-10-CM | POA: Diagnosis not present

## 2018-05-22 DIAGNOSIS — D0462 Carcinoma in situ of skin of left upper limb, including shoulder: Secondary | ICD-10-CM | POA: Diagnosis not present

## 2018-06-11 ENCOUNTER — Ambulatory Visit (INDEPENDENT_AMBULATORY_CARE_PROVIDER_SITE_OTHER): Payer: Medicare Other | Admitting: *Deleted

## 2018-06-11 DIAGNOSIS — Z7901 Long term (current) use of anticoagulants: Secondary | ICD-10-CM

## 2018-06-11 DIAGNOSIS — Z5181 Encounter for therapeutic drug level monitoring: Secondary | ICD-10-CM

## 2018-06-11 DIAGNOSIS — I4891 Unspecified atrial fibrillation: Secondary | ICD-10-CM

## 2018-06-11 LAB — POCT INR: INR: 2.1 (ref 2.0–3.0)

## 2018-06-11 NOTE — Patient Instructions (Signed)
Continue coumadin 1 1/2 tablets daily  Recheck in 6 weeks 

## 2018-06-17 ENCOUNTER — Ambulatory Visit (INDEPENDENT_AMBULATORY_CARE_PROVIDER_SITE_OTHER): Payer: Medicare Other | Admitting: Family Medicine

## 2018-06-17 ENCOUNTER — Encounter: Payer: Self-pay | Admitting: Family Medicine

## 2018-06-17 VITALS — BP 100/60 | HR 100 | Temp 98.1°F | Resp 16 | Ht 70.0 in | Wt 238.0 lb

## 2018-06-17 DIAGNOSIS — R109 Unspecified abdominal pain: Secondary | ICD-10-CM | POA: Diagnosis not present

## 2018-06-17 DIAGNOSIS — K5792 Diverticulitis of intestine, part unspecified, without perforation or abscess without bleeding: Secondary | ICD-10-CM

## 2018-06-17 LAB — URINALYSIS, ROUTINE W REFLEX MICROSCOPIC
BILIRUBIN URINE: NEGATIVE
GLUCOSE, UA: NEGATIVE
Hgb urine dipstick: NEGATIVE
KETONES UR: NEGATIVE
Leukocytes, UA: NEGATIVE
Nitrite: NEGATIVE
PROTEIN: NEGATIVE
Specific Gravity, Urine: 1.02 (ref 1.001–1.03)
pH: 5.5 (ref 5.0–8.0)

## 2018-06-17 MED ORDER — METRONIDAZOLE 500 MG PO TABS: 500.0000 mg | ORAL_TABLET | Freq: Two times a day (BID) | ORAL | 0 refills | Status: DC

## 2018-06-17 MED ORDER — CIPROFLOXACIN HCL 500 MG PO TABS: 500.0000 mg | ORAL_TABLET | Freq: Two times a day (BID) | ORAL | 0 refills | Status: DC

## 2018-06-17 NOTE — Progress Notes (Signed)
Subjective:    Patient ID: Kevin Vazquez, male    DOB: 10-Aug-1946, 72 y.o.   MRN: [id number]  HPI  06/14/17 Patient states that over the last 2-3 weeks, patient has developed increasing pain in his lower abdomen. It is constant. It is now centered in the left lower quadrant. It is mild to moderate but is gradually getting worse. He denies any melena or hematochezia although he has been having constipation. He is also having low-grade fevers. He denies any nausea or vomiting but he does report bloating and feeling full. Urinalysis is unremarkable. He denies any dysuria or hematuria or urgency or frequency. He does report weak urinary stream.  At that time, my plan was: Patient is tender to palpation in the left lower quadrant. I'm concerned these developing diverticulitis. Also on the differential diagnosis is constipation versus atypical prostatitis. However I cannot explain the fever with this constipation. Therefore I will empirically treat the patient for diverticulitis with Cipro 500 mg by mouth twice a day for 10 days and Flagyl 500 mg by mouth twice a day for 10 days. Unfortunately these were also impact his Coumadin. They will actually make his blood even thinner than it is now. Therefore I have recommended that he decrease his dose of Coumadin to 7-1/2 mg every day except Monday Wednesday Friday and on Monday Wednesday Friday he will take 5 mg. I recommended that we recheck his INR here next week and make further adjustments based on his value at that time  06/21/17 Patient states that he feels some better. He would estimate 40-50%. He continues to have suprapubic discomfort. This is made worse by hitting a bump when he drives, etc. Pain is now centered over his bladder. However he denies any dysuria. He denies any urgency. He denies any frequency. He denies any constipation. In fact he is having diarrhea on the lens OS. Defecation does not help the discomfort. He denies any hematuria. He  denies any fevers or chills. He denies any melena or hematochezia. He does report a weak stream and occasional nocturia but this is not changed greatly recently. He continues to have fatigue and generally feeling "sick, and slightly nauseous". Lab work last time was unrevealing aside from mildly elevated creatinine.  At that time, my plan was: At this point, I am unsure of the diagnosis. He is 50% better which makes me believe there are some type of infection. Diverticulitis is certainly on the differential diagnosis. Subacute prostatitis would also be on the differential diagnosis. I performed a prostate exam today and the prostate is mildly edematous and swollen but not particularly tender. Anal sphincter is tight but there is no perirectal mass. The remainder of his exam is nonspecific for any particular cause. At this point, I believe there may be an element of prostatitis versus incomplete treatment of diverticulitis.  INR is therapeutic at 2.3. Continue my recommendations on his Coumadin dose until next week. After he has completed all of his antibiotics, I want him to resume his previous dose recommended by his cardiologist. Elizebeth Koller Cipro and Flagyl. Recheck via telephone on Monday or Tuesday. If not 100% better, I will switch the patient to Bactrim to cover for prostatitis. If not improving at that point, I will proceed with a CT scan of the abdomen and pelvis to evaluate further.  06/17/18 Patient symptoms resolved quickly after his last visit making diverticulitis the most likely explanation.  The reason this is important is because he  presents with similar symptoms today.  He states that the symptoms are gradually been coming on over the last 2 weeks.  They are located in the suprapubic area.  He has no dysuria.  He denies hematuria.  He denies any radiation of the pain.  His urinalysis today is completely normal.  He denies any back pain or fevers or chills.  However he does report pain with any  movement.  He reports pain riding a tractor or riding in a truck whenever he hits a bump.  Anything that "moves his insides" elicits the pain.  His abdomen today is soft, nondistended, with normal bowel sounds however he is tender to palpation in the suprapubic area and in the left lower quadrant.  He is less tender to palpation in the right lower quadrant.  However my leading concern is colitis.  There is no evidence of UTI on his urinalysis.  He denies any rectal pain. Past Medical History:  Diagnosis Date  . Atrial fibrillation, permanent ([place])   . BPH (benign prostatic hyperplasia)   . Sick sinus syndrome [facility])    Past Surgical History:  Procedure Laterality Date  . CARDIAC CATHETERIZATION  01/26/1997   Normal LV function. Normal coronary arteries.  Marland Kitchen CARDIOVASCULAR STRESS TEST  05/02/2011   Normal perfusion scan demonstrating diaphragmatic artifact. No significant ischemia demonstrated   . LEFT LOWER EXTREMITY VENOUS DOPPLER  02/07/2009   3.8x1.3x2.8cm hypoechoic area in lateral L thigh. No evidence of DVT.  Marland Kitchen PACEMAKER INSERTION  04/24/2010   Medtronic Adapta, model #ADSRO1, serial [id number]  . TRANSTHORACIC ECHOCARDIOGRAM  11/26/2012   EF 50-55%, LA-appendage was moderately dilated   Current Outpatient Medications on File Prior to Visit  Medication Sig Dispense Refill  . diltiazem (CARDIZEM) 30 MG tablet Per dr taylor,may take additional diltiazem 30 mg daily as needed 90 tablet 1  . psyllium (METAMUCIL) 58.6 % packet Take 1 packet by mouth daily.    Marland Kitchen warfarin (COUMADIN) 5 MG tablet TAKE 1 AND 1/2 TABLETS TO 2 TABLETS DAILY OR AS DIRECTED 180 tablet 3   No current facility-administered medications on file prior to visit.    No Known Allergies Social History   Socioeconomic History  . Marital status: Married    Spouse name: Not on file  . Number of children: Not on file  . Years of education: Not on file  . Highest education level: Not on file  Occupational History  . Occupation:  Dealer Retired  Scientific laboratory technician  . Financial resource strain: Not on file  . Food insecurity:    Worry: Not on file    Inability: Not on file  . Transportation needs:    Medical: Not on file    Non-medical: Not on file  Tobacco Use  . Smoking status: Never Smoker  . Smokeless tobacco: Former Systems developer    Types: Chew  Substance and Sexual Activity  . Alcohol use: Yes    Alcohol/week: 7.0 standard drinks    Types: 7 Standard drinks or equivalent per week  . Drug use: No  . Sexual activity: Not on file  Lifestyle  . Physical activity:    Days per week: Not on file    Minutes per session: Not on file  . Stress: Not on file  Relationships  . Social connections:    Talks on phone: Not on file    Gets together: Not on file    Attends religious service: Not on file    Active member of club or  organization: Not on file    Attends meetings of clubs or organizations: Not on file    Relationship status: Not on file  . Intimate partner violence:    Fear of current or ex partner: Not on file    Emotionally abused: Not on file    Physically abused: Not on file    Forced sexual activity: Not on file  Other Topics Concern  . Not on file  Social History Narrative  . Not on file      Review of Systems  All other systems reviewed and are negative.      Objective:   Physical Exam  Constitutional: He appears well-developed and well-nourished. No distress.  Cardiovascular: Normal rate and normal heart sounds.  No murmur heard. Pulmonary/Chest: Effort normal and breath sounds normal. No respiratory distress. He has no wheezes. He has no rales.  Abdominal: Soft. Bowel sounds are normal. He exhibits no distension. There is tenderness in the right lower quadrant, suprapubic area and left lower quadrant. There is no rigidity, no rebound, no guarding and no tenderness at McBurney's point.    Skin: He is not diaphoretic.  Vitals reviewed.         Assessment & Plan:  Abdominal pain,  unspecified abdominal location - Plan: Urinalysis, Routine w reflex microscopic  Diverticulitis - Plan: CBC with Differential/Platelet, COMPLETE METABOLIC PANEL WITH GFR  I suspect colitis versus diverticulitis.  Patient is nontoxic-appearing and afebrile.  There is no guarding or rigidity or evidence of an acute abdomen although I am concerned about peritoneal inflammation even the exacerbation of pain with movement.  Begin Cipro 500 mg p.o. twice daily for 10 days and Flagyl 500 mg p.o. twice daily for 10 days.  Recheck INR on Monday to ensure antibiotic's have not affected his INR.  Begin by obtaining CBC to evaluate for leukocytosis as well as a CMP.  If symptoms worsen, proceed with a CT of the abdomen and pelvis immediately.  If symptoms drastically worsen, he is to go to the emergency room for evaluation of acute abdomen.  Patient is comfortable with the plan.  I also gave him samples of Linzess 145 mcg p.o. daily as needed constipation.  He does report over the last few days, he is having less frequent bowel movements and incomplete evacuation

## 2018-06-18 ENCOUNTER — Ambulatory Visit: Payer: Medicare Other | Admitting: Physician Assistant

## 2018-06-18 LAB — COMPLETE METABOLIC PANEL WITH GFR
AG Ratio: 2 (calc) (ref 1.0–2.5)
ALBUMIN MSPROF: 3.9 g/dL (ref 3.6–5.1)
ALKALINE PHOSPHATASE (APISO): 62 U/L (ref 40–115)
ALT: 9 U/L (ref 9–46)
AST: 16 U/L (ref 10–35)
BUN / CREAT RATIO: 13 (calc) (ref 6–22)
BUN: 18 mg/dL (ref 7–25)
CO2: 28 mmol/L (ref 20–32)
CREATININE: 1.34 mg/dL — AB (ref 0.70–1.18)
Calcium: 9.2 mg/dL (ref 8.6–10.3)
Chloride: 104 mmol/L (ref 98–110)
GFR, EST NON AFRICAN AMERICAN: 53 mL/min/{1.73_m2} — AB (ref >=60)
GFR, Est African American: 61 mL/min/{1.73_m2} (ref >=60)
GLOBULIN: 2 g/dL (ref 1.9–3.7)
Glucose, Bld: 101 mg/dL — ABNORMAL HIGH (ref 65–99)
POTASSIUM: 4.1 mmol/L (ref 3.5–5.3)
SODIUM: 140 mmol/L (ref 135–146)
TOTAL PROTEIN: 5.9 g/dL — AB (ref 6.1–8.1)
Total Bilirubin: 0.6 mg/dL (ref 0.2–1.2)

## 2018-06-18 LAB — CBC WITH DIFFERENTIAL/PLATELET
BASOS ABS: 29 {cells}/uL (ref 0–200)
Basophils Relative: 0.3 %
EOS PCT: 1.2 %
Eosinophils Absolute: 115 cells/uL (ref 15–500)
HEMATOCRIT: 42.9 % (ref 38.5–50.0)
Hemoglobin: 14.8 g/dL (ref 13.2–17.1)
LYMPHS ABS: 1046 {cells}/uL (ref 850–3900)
MCH: 30.3 pg (ref 27.0–33.0)
MCHC: 34.5 g/dL (ref 32.0–36.0)
MCV: 87.9 fL (ref 80.0–100.0)
MPV: 11.2 fL (ref 7.5–12.5)
Monocytes Relative: 6.7 %
NEUTROS PCT: 80.9 %
Neutro Abs: 7766 cells/uL (ref 1500–7800)
Platelets: 196 10*3/uL (ref 140–400)
RBC: 4.88 10*6/uL (ref 4.20–5.80)
RDW: 13.2 % (ref 11.0–15.0)
TOTAL LYMPHOCYTE: 10.9 %
WBC mixed population: 643 cells/uL (ref 200–950)
WBC: 9.6 10*3/uL (ref 3.8–10.8)

## 2018-07-23 ENCOUNTER — Ambulatory Visit (INDEPENDENT_AMBULATORY_CARE_PROVIDER_SITE_OTHER): Payer: Medicare Other | Admitting: *Deleted

## 2018-07-23 DIAGNOSIS — I4891 Unspecified atrial fibrillation: Secondary | ICD-10-CM | POA: Diagnosis not present

## 2018-07-23 DIAGNOSIS — Z7901 Long term (current) use of anticoagulants: Secondary | ICD-10-CM

## 2018-07-23 LAB — POCT INR: INR: 2.2 (ref 2.0–3.0)

## 2018-07-23 NOTE — Patient Instructions (Signed)
Continue coumadin 1 1/2 tablets daily  Recheck in 6 weeks 

## 2018-08-13 ENCOUNTER — Ambulatory Visit (INDEPENDENT_AMBULATORY_CARE_PROVIDER_SITE_OTHER): Payer: Medicare Other | Admitting: Internal Medicine

## 2018-08-13 ENCOUNTER — Encounter: Payer: Self-pay | Admitting: Internal Medicine

## 2018-08-13 VITALS — BP 102/60 | HR 70 | Ht 70.0 in | Wt 234.0 lb

## 2018-08-13 DIAGNOSIS — I4891 Unspecified atrial fibrillation: Secondary | ICD-10-CM | POA: Diagnosis not present

## 2018-08-13 LAB — CUP PACEART INCLINIC DEVICE CHECK
Battery Voltage: 2.73 V
Brady Statistic RV Percent Paced: 10 %
Date Time Interrogation Session: 20191120120014
Implantable Lead Implant Date: 20020722
Implantable Lead Location: 753860
Implantable Lead Model: 5092
Lead Channel Impedance Value: 774 Ohm
Lead Channel Pacing Threshold Amplitude: 0.75 V
Lead Channel Pacing Threshold Pulse Width: 0.4 ms
Lead Channel Setting Pacing Amplitude: 2.5 V
Lead Channel Setting Sensing Sensitivity: 2.8 mV
MDC IDC MSMT BATTERY IMPEDANCE: 3321 Ohm
MDC IDC MSMT BATTERY REMAINING LONGEVITY: 22 mo
MDC IDC MSMT LEADCHNL RA IMPEDANCE VALUE: 0 Ohm
MDC IDC MSMT LEADCHNL RV PACING THRESHOLD AMPLITUDE: 1 V
MDC IDC MSMT LEADCHNL RV PACING THRESHOLD PULSEWIDTH: 0.4 ms
MDC IDC MSMT LEADCHNL RV SENSING INTR AMPL: 11.2 mV
MDC IDC PG IMPLANT DT: 20110801
MDC IDC SET LEADCHNL RV PACING PULSEWIDTH: 0.4 ms

## 2018-08-13 MED ORDER — DILTIAZEM HCL 30 MG PO TABS: ORAL_TABLET | ORAL | 3 refills | Status: DC

## 2018-08-13 MED ORDER — WARFARIN SODIUM 5 MG PO TABS: ORAL_TABLET | ORAL | 3 refills | Status: DC

## 2018-08-13 NOTE — Patient Instructions (Signed)
Medication Instructions:  Your physician recommends that you continue on your current medications as directed. Please refer to the Current Medication list given to you today.  If you need a refill on your cardiac medications before your next appointment, please call your pharmacy.   Lab work: NONE   If you have labs (blood work) drawn today and your tests are completely normal, you will receive your results only by: . MyChart Message (if you have MyChart) OR . A paper copy in the mail If you have any lab test that is abnormal or we need to change your treatment, we will call you to review the results.  Testing/Procedures: NONE   Follow-Up: At [facility], you and your health needs are our priority.  As part of our continuing mission to provide you with exceptional heart care, we have created designated Provider Care Teams.  These Care Teams include your primary Cardiologist (physician) and Advanced Practice Providers (APPs -  Physician Assistants and Nurse Practitioners) who all work together to provide you with the care you need, when you need it. You will need a follow up appointment in 1 years.  Please call our office 2 months in advance to schedule this appointment.  You may see  or one of the following Advanced Practice Providers on your designated Care Team:   Amber Seiler, NP . Renee Ursuy, PA-C  Any Other Special Instructions Will Be Listed Below (If Applicable). Thank you for choosing [facility]!     

## 2018-08-13 NOTE — Progress Notes (Signed)
HPI Kevin Vazquez returns today for followup of his atrial fib and CHB, s/p PPM. He has done well in the interim. 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 No chest pain or sob.  No Known Allergies   Current Outpatient Medications  Medication Sig Dispense Refill  . diltiazem (CARDIZEM) 30 MG tablet Per dr Samit Sylve,may take additional diltiazem 30 mg daily as needed 90 tablet 1  . warfarin (COUMADIN) 5 MG tablet TAKE 1 AND 1/2 TABLETS TO 2 TABLETS DAILY OR AS DIRECTED 180 tablet 3   No current facility-administered medications for this visit.      Past Medical History:  Diagnosis Date  . Atrial fibrillation, permanent   . BPH (benign prostatic hyperplasia)   . Sick sinus syndrome ([facility])     ROS:   All systems reviewed and negative except as noted in the HPI.   Past Surgical History:  Procedure Laterality Date  . CARDIAC CATHETERIZATION  01/26/1997   Normal LV function. Normal coronary arteries.  Marland Kitchen CARDIOVASCULAR STRESS TEST  05/02/2011   Normal perfusion scan demonstrating diaphragmatic artifact. No significant ischemia demonstrated   . LEFT LOWER EXTREMITY VENOUS DOPPLER  02/07/2009   3.8x1.3x2.8cm hypoechoic area in lateral L thigh. No evidence of DVT.  Marland Kitchen PACEMAKER INSERTION  04/24/2010   Medtronic Adapta, model #ADSRO1, serial [id number]  . TRANSTHORACIC ECHOCARDIOGRAM  11/26/2012   EF 50-55%, LA-appendage was moderately dilated     Family History  Problem Relation Age of Onset  . Heart disease Unknown      Social History   Socioeconomic History  . Marital status: Married    Spouse name: Not on file  . Number of children: Not on file  . Years of education: Not on file  . Highest education level: Not on file  Occupational History  . Occupation: Dealer Retired  Scientific laboratory technician  . Financial resource strain: Not on file  . Food insecurity:    Worry: Not on file    Inability: Not on file  . Transportation needs:    Medical: Not  on file    Non-medical: Not on file  Tobacco Use  . Smoking status: Never Smoker  . Smokeless tobacco: Former Systems developer    Types: Chew  Substance and Sexual Activity  . Alcohol use: Yes    Alcohol/week: 7.0 standard drinks    Types: 7 Standard drinks or equivalent per week  . Drug use: No  . Sexual activity: Not on file  Lifestyle  . Physical activity:    Days per week: Not on file    Minutes per session: Not on file  . Stress: Not on file  Relationships  . Social connections:    Talks on phone: Not on file    Gets together: Not on file    Attends religious service: Not on file    Active member of club or organization: Not on file    Attends meetings of clubs or organizations: Not on file    Relationship status: Not on file  . Intimate partner violence:    Fear of current or ex partner: Not on file    Emotionally abused: Not on file    Physically abused: Not on file    Forced sexual activity: Not on file  Other Topics Concern  . Not on file  Social History Narrative  . Not on file     BP 102/60 (BP Location: Left Arm)   Pulse 70  Ht 5\' 10"  (1.778 m)   Wt 234 lb (106.1 kg)   SpO2 97%   BMI 33.58 kg/m   Physical Exam:  Well appearing NAD HEENT: Unremarkable Neck:  No JVD, no thyromegally Lymphatics:  No adenopathy Back:  No CVA tenderness Lungs:  Clear with no wheezes HEART:  Regular rate rhythm, no murmurs, no rubs, no clicks Abd:  soft, positive bowel sounds, no organomegally, no rebound, no guarding Ext:  2 plus pulses, no edema, no cyanosis, no clubbing Skin:  No rashes no nodules Neuro:  CN II through XII intact, motor grossly intact  EKG - atrial fib with a controlled VR/ventricular pacing  DEVICE  Normal device function.  See PaceArt for details.   Assess/Plan: 1. Atrial fib - his rates are well controlled. No change in his meds. 2. PPM - his medtronic single chamber PPM is working normally.  3. Coags - he is tolerating his systemic anti-coagulation  with no bleeding  Mikle Bosworth.D.

## 2018-08-26 DIAGNOSIS — L570 Actinic keratosis: Secondary | ICD-10-CM | POA: Diagnosis not present

## 2018-09-03 ENCOUNTER — Ambulatory Visit (INDEPENDENT_AMBULATORY_CARE_PROVIDER_SITE_OTHER): Payer: Medicare Other | Admitting: *Deleted

## 2018-09-03 DIAGNOSIS — Z5181 Encounter for therapeutic drug level monitoring: Secondary | ICD-10-CM | POA: Diagnosis not present

## 2018-09-03 DIAGNOSIS — I4891 Unspecified atrial fibrillation: Secondary | ICD-10-CM | POA: Diagnosis not present

## 2018-09-03 DIAGNOSIS — Z7901 Long term (current) use of anticoagulants: Secondary | ICD-10-CM | POA: Diagnosis not present

## 2018-09-03 LAB — POCT INR: INR: 2.8 (ref 2.0–3.0)

## 2018-09-03 NOTE — Patient Instructions (Signed)
Continue coumadin 1 1/2 tablets daily  Recheck in 6 weeks 

## 2018-10-02 ENCOUNTER — Ambulatory Visit (INDEPENDENT_AMBULATORY_CARE_PROVIDER_SITE_OTHER): Payer: Medicare Other | Admitting: Family Medicine

## 2018-10-02 ENCOUNTER — Encounter: Payer: Self-pay | Admitting: Family Medicine

## 2018-10-02 VITALS — BP 118/58 | HR 98 | Temp 98.0°F | Resp 18 | Ht 70.0 in | Wt 237.0 lb

## 2018-10-02 DIAGNOSIS — H6981 Other specified disorders of Eustachian tube, right ear: Secondary | ICD-10-CM | POA: Diagnosis not present

## 2018-10-02 MED ORDER — FLUTICASONE PROPIONATE 50 MCG/ACT NA SUSP: 2.0000 | Freq: Every day | NASAL | 6 refills | Status: DC

## 2018-10-02 MED ORDER — LEVOCETIRIZINE DIHYDROCHLORIDE 5 MG PO TABS: 5.0000 mg | ORAL_TABLET | Freq: Every evening | ORAL | 0 refills | Status: DC

## 2018-10-02 NOTE — Progress Notes (Signed)
Subjective:    Patient ID: Kevin Vazquez, male    DOB: 1945/10/18, 73 y.o.   MRN: [id number]  HPI  Patient states that for the last few months, he has noticed decreased hearing in his right ear.  His ear feels stopped up like he is underwater.  He previously had this happen before and went as far as having tympanostomy tubes placed in his right ear due to eustachian tube dysfunction however the tube fell out after just a few weeks.  While he had the tube in place however the symptoms improved.  Occasionally his ear will "pop" in his ear will feel better however this occurs infrequently.  He denies any pain in his ear.  He denies any sinus pain.  He denies any rhinorrhea.  He denies any headaches.  He denies any vertigo.  He denies any tinnitus.  He does report subjective hearing loss in the right ear.  On examination today there is a small amount of wax as diagrammed but not sufficient enough to explain his symptoms  Past Medical History:  Diagnosis Date  . Atrial fibrillation, permanent   . BPH (benign prostatic hyperplasia)   . Sick sinus syndrome [facility])    Past Surgical History:  Procedure Laterality Date  . CARDIAC CATHETERIZATION  01/26/1997   Normal LV function. Normal coronary arteries.  Marland Kitchen CARDIOVASCULAR STRESS TEST  05/02/2011   Normal perfusion scan demonstrating diaphragmatic artifact. No significant ischemia demonstrated   . LEFT LOWER EXTREMITY VENOUS DOPPLER  02/07/2009   3.8x1.3x2.8cm hypoechoic area in lateral L thigh. No evidence of DVT.  Marland Kitchen PACEMAKER INSERTION  04/24/2010   Medtronic Adapta, model #ADSRO1, serial [id number]  . TRANSTHORACIC ECHOCARDIOGRAM  11/26/2012   EF 50-55%, LA-appendage was moderately dilated   Current Outpatient Medications on File Prior to Visit  Medication Sig Dispense Refill  . diltiazem (CARDIZEM) 30 MG tablet Per dr taylor,may take additional diltiazem 30 mg daily as needed 90 tablet 3  . warfarin (COUMADIN) 5 MG tablet TAKE 1 AND 1/2 TABLETS  TO 2 TABLETS DAILY OR AS DIRECTED 180 tablet 3   No current facility-administered medications on file prior to visit.    No Known Allergies Social History   Socioeconomic History  . Marital status: Married    Spouse name: Not on file  . Number of children: Not on file  . Years of education: Not on file  . Highest education level: Not on file  Occupational History  . Occupation: Dealer Retired  Scientific laboratory technician  . Financial resource strain: Not on file  . Food insecurity:    Worry: Not on file    Inability: Not on file  . Transportation needs:    Medical: Not on file    Non-medical: Not on file  Tobacco Use  . Smoking status: Never Smoker  . Smokeless tobacco: Former Systems developer    Types: Chew  Substance and Sexual Activity  . Alcohol use: Yes    Alcohol/week: 7.0 standard drinks    Types: 7 Standard drinks or equivalent per week  . Drug use: No  . Sexual activity: Not on file  Lifestyle  . Physical activity:    Days per week: Not on file    Minutes per session: Not on file  . Stress: Not on file  Relationships  . Social connections:    Talks on phone: Not on file    Gets together: Not on file    Attends religious service: Not on file  Active member of club or organization: Not on file    Attends meetings of clubs or organizations: Not on file    Relationship status: Not on file  . Intimate partner violence:    Fear of current or ex partner: Not on file    Emotionally abused: Not on file    Physically abused: Not on file    Forced sexual activity: Not on file  Other Topics Concern  . Not on file  Social History Narrative  . Not on file      Review of Systems  All other systems reviewed and are negative.      Objective:   Physical Exam  Constitutional: He appears well-developed and well-nourished. No distress.  HENT:  Right Ear: Tympanic membrane and ear canal normal. No tenderness. Tympanic membrane is not injected, not scarred, not perforated, not  erythematous, not retracted and not bulging. No middle ear effusion. Decreased hearing is noted.  Left Ear: Tympanic membrane and ear canal normal. No tenderness. Tympanic membrane is not injected, not scarred, not perforated, not erythematous, not retracted and not bulging.  No middle ear effusion. No decreased hearing is noted.  Ears:  Cardiovascular: Normal rate and normal heart sounds. An irregularly irregular rhythm present.  No murmur heard. Pulmonary/Chest: Effort normal and breath sounds normal. No respiratory distress. He has no wheezes. He has no rales.  Abdominal: There is no rigidity and no tenderness at McBurney's point.  Skin: He is not diaphoretic.  Vitals reviewed.         Assessment & Plan:  Right-sided eustachian tube dysfunction.  Recommend starting Flonase 2 sprays each nostril daily.  Recommended using Xyzal 5 mg daily.  Recommended chewing gum on a daily basis in an effort to try to mobilize the eustachian tube.  Recommended Valsalva exercises to try to help clear the eustachian tube.  If not improving over the next few weeks would recommend ENT consultation.  Right ear does show hearing loss.  Patient could hear 500 Hz at 40 dB.  He cannot hear thousand hertz.  He could hear 2000 at 40 dB, he cannot hear 4000

## 2018-10-15 ENCOUNTER — Ambulatory Visit (INDEPENDENT_AMBULATORY_CARE_PROVIDER_SITE_OTHER): Payer: Medicare Other | Admitting: Pharmacist

## 2018-10-15 DIAGNOSIS — Z5181 Encounter for therapeutic drug level monitoring: Secondary | ICD-10-CM | POA: Diagnosis not present

## 2018-10-15 DIAGNOSIS — I4891 Unspecified atrial fibrillation: Secondary | ICD-10-CM | POA: Diagnosis not present

## 2018-10-15 DIAGNOSIS — Z7901 Long term (current) use of anticoagulants: Secondary | ICD-10-CM | POA: Diagnosis not present

## 2018-10-15 LAB — POCT INR: INR: 2 (ref 2.0–3.0)

## 2018-10-15 NOTE — Patient Instructions (Signed)
Description   Continue coumadin 1 1/2 tablets daily  Recheck in 6 weeks     

## 2018-11-26 ENCOUNTER — Ambulatory Visit (INDEPENDENT_AMBULATORY_CARE_PROVIDER_SITE_OTHER): Payer: Medicare Other | Admitting: *Deleted

## 2018-11-26 DIAGNOSIS — Z5181 Encounter for therapeutic drug level monitoring: Secondary | ICD-10-CM

## 2018-11-26 DIAGNOSIS — Z7901 Long term (current) use of anticoagulants: Secondary | ICD-10-CM

## 2018-11-26 DIAGNOSIS — I4891 Unspecified atrial fibrillation: Secondary | ICD-10-CM | POA: Diagnosis not present

## 2018-11-26 LAB — POCT INR: INR: 2 (ref 2.0–3.0)

## 2018-11-26 NOTE — Patient Instructions (Signed)
Take coumadin extra 1/2 tablet today then resume 1 1/2 tablets daily Recheck in 6 weeks

## 2019-01-06 ENCOUNTER — Encounter: Payer: Self-pay | Admitting: *Deleted

## 2019-01-06 ENCOUNTER — Telehealth: Payer: Self-pay | Admitting: *Deleted

## 2019-01-06 NOTE — Telephone Encounter (Signed)
° °  COVID-19 Pre-Screening Questions: ° °• Do you currently have a fever?  (yes = cancel and refer to pcp for e-visit) °• Have you recently travelled on a cruise, internationally, or to [place], [place], [place], [place], [place], or [place], [place] ([employer]) ?  (yes = cancel, stay home, monitor symptoms, and contact pcp or initiate e-visit if symptoms develop) °• Have you been in contact with someone that is currently pending confirmation of Covid19 testing or has been confirmed to have the Covid19 virus? (yes = cancel, stay home, away from tested individual, monitor symptoms, and contact pcp or initiate e-visit if symptoms develop) °• Are you currently experiencing fatigue or cough?  (yes = pt should be prepared to have a mask placed at the time of their visit). ° ° °   ° ° ° ° °' °

## 2019-01-19 ENCOUNTER — Ambulatory Visit (INDEPENDENT_AMBULATORY_CARE_PROVIDER_SITE_OTHER): Payer: Medicare Other | Admitting: *Deleted

## 2019-01-19 ENCOUNTER — Other Ambulatory Visit: Payer: Self-pay

## 2019-01-19 DIAGNOSIS — Z5181 Encounter for therapeutic drug level monitoring: Secondary | ICD-10-CM | POA: Diagnosis not present

## 2019-01-19 DIAGNOSIS — I4891 Unspecified atrial fibrillation: Secondary | ICD-10-CM | POA: Diagnosis not present

## 2019-01-19 LAB — POCT INR: INR: 2.4 (ref 2.0–3.0)

## 2019-01-19 NOTE — Patient Instructions (Signed)
Continue coumadin 1 1/2 tablets daily  Recheck in 6 weeks 

## 2019-03-02 ENCOUNTER — Telehealth: Payer: Self-pay | Admitting: *Deleted

## 2019-03-02 NOTE — Telephone Encounter (Signed)

## 2019-03-03 ENCOUNTER — Ambulatory Visit (INDEPENDENT_AMBULATORY_CARE_PROVIDER_SITE_OTHER): Payer: Medicare Other | Admitting: *Deleted

## 2019-03-03 DIAGNOSIS — Z5181 Encounter for therapeutic drug level monitoring: Secondary | ICD-10-CM | POA: Diagnosis not present

## 2019-03-03 DIAGNOSIS — I4891 Unspecified atrial fibrillation: Secondary | ICD-10-CM

## 2019-03-03 LAB — POCT INR: INR: 2.2 (ref 2.0–3.0)

## 2019-03-03 NOTE — Patient Instructions (Signed)
Continue coumadin 1 1/2 tablets daily  Recheck in 6 weeks 

## 2019-03-10 ENCOUNTER — Other Ambulatory Visit: Payer: Self-pay | Admitting: Physician Assistant

## 2019-03-10 DIAGNOSIS — L905 Scar conditions and fibrosis of skin: Secondary | ICD-10-CM | POA: Diagnosis not present

## 2019-03-10 DIAGNOSIS — L570 Actinic keratosis: Secondary | ICD-10-CM | POA: Diagnosis not present

## 2019-03-10 DIAGNOSIS — L723 Sebaceous cyst: Secondary | ICD-10-CM | POA: Diagnosis not present

## 2019-03-10 DIAGNOSIS — L720 Epidermal cyst: Secondary | ICD-10-CM | POA: Diagnosis not present

## 2019-03-10 DIAGNOSIS — L089 Local infection of the skin and subcutaneous tissue, unspecified: Secondary | ICD-10-CM | POA: Diagnosis not present

## 2019-04-21 ENCOUNTER — Ambulatory Visit (INDEPENDENT_AMBULATORY_CARE_PROVIDER_SITE_OTHER): Payer: Medicare Other | Admitting: *Deleted

## 2019-04-21 DIAGNOSIS — I4891 Unspecified atrial fibrillation: Secondary | ICD-10-CM

## 2019-04-21 DIAGNOSIS — Z5181 Encounter for therapeutic drug level monitoring: Secondary | ICD-10-CM

## 2019-04-21 LAB — POCT INR: INR: 1.9 — AB (ref 2.0–3.0)

## 2019-04-21 NOTE — Patient Instructions (Signed)
Take 2 tablets today then resume 1 1/2 tablets daily Recheck in 6 weeks

## 2019-05-27 ENCOUNTER — Telehealth: Payer: Self-pay | Admitting: Pharmacist

## 2019-05-27 NOTE — Telephone Encounter (Signed)
Called pt to discuss changing to Bennett therapy due to better safety and efficacy data, especially in the setting of COVID pandemic to help reduce in-office visits. Pt prefers to stay on warfarin at this time since it has worked well for him for 20+ years.

## 2019-06-03 ENCOUNTER — Ambulatory Visit (INDEPENDENT_AMBULATORY_CARE_PROVIDER_SITE_OTHER): Payer: Medicare Other | Admitting: *Deleted

## 2019-06-03 ENCOUNTER — Other Ambulatory Visit: Payer: Self-pay

## 2019-06-03 DIAGNOSIS — Z5181 Encounter for therapeutic drug level monitoring: Secondary | ICD-10-CM

## 2019-06-03 DIAGNOSIS — I4891 Unspecified atrial fibrillation: Secondary | ICD-10-CM | POA: Diagnosis not present

## 2019-06-03 LAB — POCT INR: INR: 2.7 (ref 2.0–3.0)

## 2019-06-03 NOTE — Patient Instructions (Signed)
Continue coumadin 1 1/2 tablets daily  Recheck in 6 weeks 

## 2019-07-15 ENCOUNTER — Ambulatory Visit (INDEPENDENT_AMBULATORY_CARE_PROVIDER_SITE_OTHER): Payer: Medicare Other | Admitting: *Deleted

## 2019-07-15 ENCOUNTER — Other Ambulatory Visit: Payer: Self-pay

## 2019-07-15 DIAGNOSIS — I4891 Unspecified atrial fibrillation: Secondary | ICD-10-CM | POA: Diagnosis not present

## 2019-07-15 DIAGNOSIS — Z5181 Encounter for therapeutic drug level monitoring: Secondary | ICD-10-CM | POA: Diagnosis not present

## 2019-07-15 LAB — POCT INR: INR: 2.2 (ref 2.0–3.0)

## 2019-07-15 NOTE — Patient Instructions (Signed)
Continue coumadin 1 1/2 tablets daily  Recheck in 6 weeks 

## 2019-08-11 ENCOUNTER — Ambulatory Visit (INDEPENDENT_AMBULATORY_CARE_PROVIDER_SITE_OTHER): Payer: Medicare Other | Admitting: Internal Medicine

## 2019-08-11 ENCOUNTER — Encounter: Payer: Self-pay | Admitting: Internal Medicine

## 2019-08-11 ENCOUNTER — Other Ambulatory Visit: Payer: Self-pay

## 2019-08-11 VITALS — BP 111/74 | HR 104 | Temp 98.2°F | Ht 70.0 in | Wt 240.4 lb

## 2019-08-11 DIAGNOSIS — Z950 Presence of cardiac pacemaker: Secondary | ICD-10-CM | POA: Diagnosis not present

## 2019-08-11 DIAGNOSIS — I4821 Permanent atrial fibrillation: Secondary | ICD-10-CM | POA: Diagnosis not present

## 2019-08-11 MED ORDER — WARFARIN SODIUM 5 MG PO TABS: ORAL_TABLET | ORAL | 3 refills | Status: DC

## 2019-08-11 MED ORDER — METOPROLOL SUCCINATE ER 25 MG PO TB24: 12.5000 mg | ORAL_TABLET | Freq: Every day | ORAL | 3 refills | Status: DC

## 2019-08-11 MED ORDER — DILTIAZEM HCL 30 MG PO TABS: ORAL_TABLET | ORAL | 3 refills | Status: DC

## 2019-08-11 NOTE — Patient Instructions (Signed)
Medication Instructions:  Your physician has recommended you make the following change in your medication:  Start Toprol XL 12.5 mg Daily   *If you need a refill on your cardiac medications before your next appointment, please call your pharmacy*  Lab Work: NONE  If you have labs (blood work) drawn today and your tests are completely normal, you will receive your results only by: Marland Kitchen MyChart Message (if you have MyChart) OR . A paper copy in the mail If you have any lab test that is abnormal or we need to change your treatment, we will call you to review the results.  Testing/Procedures: NONE   Follow-Up: At [facility], you and your health needs are our priority.  As part of our continuing mission to provide you with exceptional heart care, we have created designated Provider Care Teams.  These Care Teams include your primary Cardiologist (physician) and Advanced Practice Providers (APPs -  Physician Assistants and Nurse Practitioners) who all work together to provide you with the care you need, when you need it.  Your next appointment:   12 months  The format for your next appointment:   In Person  Provider:   Cristopher Peru, MD  Other Instructions Thank you for choosing [facility]!

## 2019-08-11 NOTE — Progress Notes (Signed)
HPI Kevin Vazquez returns today for ongoing evaluation of chronic atrial fibrillation, complete heart block, status post pacemaker insertion, and hypertension.  The patient has done well in the interim.  He does experience palpitations.  He notes that when he is doing something strenuous, his heart will speed up and he will feel the palpitations.  He has not had syncope.  He denies chest pain.  He has class II dyspnea.  No peripheral edema.  He remains active maintaining rental houses. No Known Allergies   Current Outpatient Medications  Medication Sig Dispense Refill  . diltiazem (CARDIZEM) 30 MG tablet Per dr Nnamdi Dacus,may take additional diltiazem 30 mg daily as needed 90 tablet 3  . fluticasone (FLONASE) 50 MCG/ACT nasal spray Place 2 sprays into both nostrils daily. 16 g 6  . minocycline (MINOCIN) 50 MG capsule Take 50 mg by mouth daily.    Marland Kitchen warfarin (COUMADIN) 5 MG tablet TAKE 1 AND 1/2 TABLETS TO 2 TABLETS DAILY OR AS DIRECTED 30 tablet 3  . metoprolol succinate (TOPROL XL) 25 MG 24 hr tablet Take 0.5 tablets (12.5 mg total) by mouth daily. 45 tablet 3   No current facility-administered medications for this visit.      Past Medical History:  Diagnosis Date  . Atrial fibrillation, permanent ([place])   . BPH (benign prostatic hyperplasia)   . Sick sinus syndrome ([facility])     ROS:   All systems reviewed and negative except as noted in the HPI.   Past Surgical History:  Procedure Laterality Date  . CARDIAC CATHETERIZATION  01/26/1997   Normal LV function. Normal coronary arteries.  Marland Kitchen CARDIOVASCULAR STRESS TEST  05/02/2011   Normal perfusion scan demonstrating diaphragmatic artifact. No significant ischemia demonstrated   . LEFT LOWER EXTREMITY VENOUS DOPPLER  02/07/2009   3.8x1.3x2.8cm hypoechoic area in lateral L thigh. No evidence of DVT.  Marland Kitchen PACEMAKER INSERTION  04/24/2010   Medtronic Adapta, model #ADSRO1, serial [id number]  . TRANSTHORACIC ECHOCARDIOGRAM  11/26/2012   EF 50-55%,  LA-appendage was moderately dilated     Family History  Problem Relation Age of Onset  . Heart disease Other      Social History   Socioeconomic History  . Marital status: Married    Spouse name: Not on file  . Number of children: Not on file  . Years of education: Not on file  . Highest education level: Not on file  Occupational History  . Occupation: Dealer Retired  Scientific laboratory technician  . Financial resource strain: Not on file  . Food insecurity    Worry: Not on file    Inability: Not on file  . Transportation needs    Medical: Not on file    Non-medical: Not on file  Tobacco Use  . Smoking status: Never Smoker  . Smokeless tobacco: Former Systems developer    Types: Chew  Substance and Sexual Activity  . Alcohol use: Yes    Alcohol/week: 7.0 standard drinks    Types: 7 Standard drinks or equivalent per week  . Drug use: No  . Sexual activity: Not on file  Lifestyle  . Physical activity    Days per week: Not on file    Minutes per session: Not on file  . Stress: Not on file  Relationships  . Social Herbalist on phone: Not on file    Gets together: Not on file    Attends religious service: Not on file    Active member of  club or organization: Not on file    Attends meetings of clubs or organizations: Not on file    Relationship status: Not on file  . Intimate partner violence    Fear of current or ex partner: Not on file    Emotionally abused: Not on file    Physically abused: Not on file    Forced sexual activity: Not on file  Other Topics Concern  . Not on file  Social History Narrative  . Not on file     BP 111/74   Pulse (!) 104   Temp 98.2 F (36.8 C) (Temporal)   Ht 5\' 10"  (1.778 m)   Wt 240 lb 6.4 oz (109 kg)   SpO2 96%   BMI 34.49 kg/m   Physical Exam:  Well appearing 73 year old man, NAD HEENT: Unremarkable Neck:  No JVD, no thyromegally Lymphatics:  No adenopathy Back:  No CVA tenderness Lungs:  Clear, with no wheezes, rales, or  rhonchi HEART:  IRegular rate rhythm, no murmurs, no rubs, no clicks Abd:  soft, positive bowel sounds, no organomegally, no rebound, no guarding Ext:  2 plus pulses, no edema, no cyanosis, no clubbing Skin:  No rashes no nodules Neuro:  CN II through XII intact, motor grossly intact   DEVICE  Normal device function.  See PaceArt for details.   Assess/Plan: 1.  Chronic atrial fibrillation -his ventricular rate does not appear to be well controlled.  We discussed the treatment options.  We will start him on low-dose Toprol 12.5 mg once daily. 2.  Pacemaker -his Medtronic single-chamber pacemaker is working normally.  He has approximately 1-1/2 years of battery longevity.  He is only pacing approximately 10% of the time. 3.  Hypertension -his blood pressure is well controlled.  I do not expect much lowering of his pressure with initiation of low-dose Toprol. 4.  Coags -he is tolerating systemic anticoagulation with warfarin.  He will continue to follow-up in our Coumadin clinic.  Cristopher Peru, MD

## 2019-08-26 ENCOUNTER — Ambulatory Visit (INDEPENDENT_AMBULATORY_CARE_PROVIDER_SITE_OTHER): Payer: Medicare Other | Admitting: *Deleted

## 2019-08-26 ENCOUNTER — Other Ambulatory Visit: Payer: Self-pay

## 2019-08-26 DIAGNOSIS — Z5181 Encounter for therapeutic drug level monitoring: Secondary | ICD-10-CM | POA: Diagnosis not present

## 2019-08-26 DIAGNOSIS — I4891 Unspecified atrial fibrillation: Secondary | ICD-10-CM | POA: Diagnosis not present

## 2019-08-26 LAB — POCT INR: INR: 1.8 — AB (ref 2.0–3.0)

## 2019-08-26 NOTE — Patient Instructions (Signed)
Take warfarin 2 tablets today and tomorrow then resume 1 1/2 tablets daily Recheck in 4 weeks

## 2019-09-16 ENCOUNTER — Other Ambulatory Visit: Payer: Self-pay | Admitting: Dermatology

## 2019-09-23 ENCOUNTER — Ambulatory Visit (INDEPENDENT_AMBULATORY_CARE_PROVIDER_SITE_OTHER): Payer: Medicare Other | Admitting: *Deleted

## 2019-09-23 ENCOUNTER — Other Ambulatory Visit: Payer: Self-pay

## 2019-09-23 DIAGNOSIS — I4891 Unspecified atrial fibrillation: Secondary | ICD-10-CM | POA: Diagnosis not present

## 2019-09-23 DIAGNOSIS — Z5181 Encounter for therapeutic drug level monitoring: Secondary | ICD-10-CM

## 2019-09-23 LAB — POCT INR: INR: 2.5 (ref 2.0–3.0)

## 2019-09-23 NOTE — Patient Instructions (Signed)
Continue 1 1/2 tablets daily Recheck in 4 weeks

## 2019-10-13 ENCOUNTER — Other Ambulatory Visit: Payer: Self-pay | Admitting: Family Medicine

## 2019-10-13 MED ORDER — METOPROLOL SUCCINATE ER 25 MG PO TB24: 12.5000 mg | ORAL_TABLET | Freq: Every day | ORAL | 1 refills | Status: DC

## 2019-10-13 MED ORDER — WARFARIN SODIUM 5 MG PO TABS: ORAL_TABLET | ORAL | 1 refills | Status: DC

## 2019-10-13 MED ORDER — DILTIAZEM HCL 30 MG PO TABS: ORAL_TABLET | ORAL | 1 refills | Status: DC

## 2019-10-24 ENCOUNTER — Ambulatory Visit: Payer: Medicare Other

## 2019-10-29 ENCOUNTER — Ambulatory Visit: Payer: Medicare Other

## 2019-10-30 ENCOUNTER — Ambulatory Visit: Payer: Medicare Other | Attending: Internal Medicine

## 2019-10-30 DIAGNOSIS — Z23 Encounter for immunization: Secondary | ICD-10-CM | POA: Insufficient documentation

## 2019-10-30 NOTE — Progress Notes (Signed)
   Covid-19 Vaccination Clinic  Name:  Kevin Vazquez    MRN: [id number] DOB: 1946-04-07  10/30/2019  [name] was observed post Covid-19 immunization for 15 minutes without incidence. He was provided with Vaccine Information Sheet and instruction to access the V-Safe system.   [name] was instructed to call 911 with any severe reactions post vaccine: Marland Kitchen Difficulty breathing  . Swelling of your face and throat  . A fast heartbeat  . A bad rash all over your body  . Dizziness and weakness    Immunizations Administered    Name Date Dose VIS Date Route   Pfizer COVID-19 Vaccine 10/30/2019 10:06 AM 0.3 mL 09/04/2019 Intramuscular   Manufacturer: [place]   Lot: [id number]   [place]: [id number]

## 2019-11-04 ENCOUNTER — Other Ambulatory Visit: Payer: Self-pay | Admitting: Family Medicine

## 2019-11-04 MED ORDER — DILTIAZEM HCL 30 MG PO TABS: ORAL_TABLET | ORAL | 1 refills | Status: DC

## 2019-11-10 ENCOUNTER — Ambulatory Visit (INDEPENDENT_AMBULATORY_CARE_PROVIDER_SITE_OTHER): Payer: Medicare Other | Admitting: Family Medicine

## 2019-11-10 DIAGNOSIS — G609 Hereditary and idiopathic neuropathy, unspecified: Secondary | ICD-10-CM

## 2019-11-10 DIAGNOSIS — I872 Venous insufficiency (chronic) (peripheral): Secondary | ICD-10-CM

## 2019-11-10 DIAGNOSIS — R7309 Other abnormal glucose: Secondary | ICD-10-CM | POA: Diagnosis not present

## 2019-11-10 NOTE — Progress Notes (Signed)
Subjective:    Patient ID: Kevin Vazquez, male    DOB: 1945-10-07, 74 y.o.   MRN: [id number]  HPI  Patient is a very pleasant 74 year old Caucasian male here today complaining of itching in his lower legs.  He states that occasionally his lower legs will itch from his knees to his feet.  When he looks at the skin, it will be brown or dark you similar to a bruise.  He describes it like "you sprained your ankle".  Therefore I assume it is a violaceous purple-brownish color.  He states that this will last only for a while and then go away.  He has been using a steroid cream that his dermatologist gave him that helps with the itching.  Nothing seems to trigger it.  Nothing seems to make it worse.  Nothing seems to make it better.  On examination today, the patient does have venous stasis changes in the skin.  There is a hyperpigmented rash that is brownish-gray in color on the anterior surfaces of both shins from the ankle to the mid shin.  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  Patient has good 2/4 dorsalis pedis and posterior tibialis pulses bilaterally.  He has good strength and no claudication in his legs.  He does have several varicose veins throughout his lower leg. Past Medical History:  Diagnosis Date  . Atrial fibrillation, permanent ([place])   . Basal cell carcinoma 04/02/2017   Left upper lip - CX3 + cautery + 5FU  . BPH (benign prostatic hyperplasia)   . Melanoma ([place]) 08/31/2004   Level III - upper right back shoulder (Dr. Clovis Riley)  . Sick sinus syndrome ([place])   . Squamous cell carcinoma of skin 04/02/2017   Left temple - CX3 + cautery + 5FU  . Squamous cell carcinoma of skin 04/11/2018   Left forearm - CX3 + cautery + 5FU  . Squamous cell carcinoma of skin 07/19/20019   Left forehead - CX3 + cautery + 5FU  . Squamous cell carcinoma of skin 09/16/2019   Left inner arm - tx p bx   Past Surgical History:  Procedure Laterality Date  . CARDIAC CATHETERIZATION  01/26/1997   Normal  LV function. Normal coronary arteries.  Marland Kitchen CARDIOVASCULAR STRESS TEST  05/02/2011   Normal perfusion scan demonstrating diaphragmatic artifact. No significant ischemia demonstrated   . LEFT LOWER EXTREMITY VENOUS DOPPLER  02/07/2009   3.8x1.3x2.8cm hypoechoic area in lateral L thigh. No evidence of DVT.  Marland Kitchen PACEMAKER INSERTION  04/24/2010   Medtronic Adapta, model #ADSRO1, serial [id number]  . TRANSTHORACIC ECHOCARDIOGRAM  11/26/2012   EF 50-55%, LA-appendage was moderately dilated   Current Outpatient Medications on File Prior to Visit  Medication Sig Dispense Refill  . diltiazem (CARDIZEM) 30 MG tablet Take 1 tab po qd and Per dr taylor,may take additional diltiazem 30 mg daily as needed 90 tablet 1  . metoprolol succinate (TOPROL XL) 25 MG 24 hr tablet Take 0.5 tablets (12.5 mg total) by mouth daily. 45 tablet 1  . minocycline (MINOCIN) 50 MG capsule Take 50 mg by mouth daily.    Marland Kitchen warfarin (COUMADIN) 5 MG tablet TAKE 1 AND 1/2 TABLETS TO 2 TABLETS DAILY OR AS DIRECTED 180 tablet 1   No current facility-administered medications on file prior to visit.   No Known Allergies Social History   Socioeconomic History  . Marital status: Married    Spouse name: Not on file  . Number of children: Not on  file  . Years of education: Not on file  . Highest education level: Not on file  Occupational History  . Occupation: Dealer Retired  Tobacco Use  . Smoking status: Never Smoker  . Smokeless tobacco: Former Systems developer    Types: Chew  Substance and Sexual Activity  . Alcohol use: Yes    Alcohol/week: 7.0 standard drinks    Types: 7 Standard drinks or equivalent per week  . Drug use: No  . Sexual activity: Not on file  Other Topics Concern  . Not on file  Social History Narrative  . Not on file   Social Determinants of Health   Financial Resource Strain:   . Difficulty of Paying Living Expenses: Not on file  Food Insecurity:   . Worried About Charity fundraiser in the Last Year: Not on  file  . Ran Out of Food in the Last Year: Not on file  Transportation Needs:   . Lack of Transportation (Medical): Not on file  . Lack of Transportation (Non-Medical): Not on file  Physical Activity:   . Days of Exercise per Week: Not on file  . Minutes of Exercise per Session: Not on file  Stress:   . Feeling of Stress : Not on file  Social Connections:   . Frequency of Communication with Friends and Family: Not on file  . Frequency of Social Gatherings with Friends and Family: Not on file  . Attends Religious Services: Not on file  . Active Member of Clubs or Organizations: Not on file  . Attends Archivist Meetings: Not on file  . Marital Status: Not on file  Intimate Partner Violence:   . Fear of Current or Ex-Partner: Not on file  . Emotionally Abused: Not on file  . Physically Abused: Not on file  . Sexually Abused: Not on file      Review of Systems  All other systems reviewed and are negative.      Objective:   Physical Exam  Constitutional: He appears well-developed and well-nourished. No distress.  Cardiovascular: Normal rate and normal heart sounds.  No murmur heard. Pulses:      Dorsalis pedis pulses are 2+ on the right side and 2+ on the left side.       Posterior tibial pulses are 2+ on the right side and 2+ on the left side.  Pulmonary/Chest: Effort normal and breath sounds normal. No respiratory distress. He has no wheezes. He has no rales.  Neurological: He has normal reflexes. He displays normal reflexes. No cranial nerve deficit. He exhibits normal muscle tone. Coordination normal.  Skin: He is not diaphoretic.  Vitals reviewed.         Assessment & Plan:  Venous stasis dermatitis of both lower extremities - Plan: CBC with Differential/Platelet, COMPLETE METABOLIC PANEL WITH GFR, TSH, Vitamin B12, Hemoglobin A1c  Peripheral neuropathy, idiopathic - Plan: CBC with Differential/Platelet, COMPLETE METABOLIC PANEL WITH GFR, TSH, Vitamin  B12, Hemoglobin A1c  The evanescent nature of the rash is confusing me.  Schamberg's disease would not go and come.  Therefore I believe most likely the patient has venous stasis dermatitis brought on by chronic venous insufficiency.  I believe this is likely causing his legs to itch when his legs are slightly swollen.  I believe this is what likely causes the hyperpigmentation which improves as the swelling goes down likely when he elevates his legs.  The other possibility would be peripheral neuropathy causing the itching with Schamberg's disease.  I will check lab work to evaluate for peripheral neuropathy including a vitamin B12, TSH, CMP, and hemoglobin A1c.  However I suspect venous insufficiency with venous stasis dermatitis.  Therefore I recommended that we treat this by using compression hose 15 to 20 mmHg that are knee-high every day to help control the swelling.  I believe this will help prevent the itching.  When he does experience itching he can use topical corticosteroid creams.  I reassured the patient that either condition whether it is peripheral neuropathy with Schamberg's disease or chronic venous insufficiency, both are benign.  I do not believe this is claudication due to arterial insufficiency.

## 2019-11-11 LAB — CBC WITH DIFFERENTIAL/PLATELET
Absolute Monocytes: 538 cells/uL (ref 200–950)
Basophils Absolute: 19 cells/uL (ref 0–200)
Basophils Relative: 0.4 %
Eosinophils Absolute: 178 cells/uL (ref 15–500)
Eosinophils Relative: 3.7 %
HCT: 44.1 % (ref 38.5–50.0)
Hemoglobin: 15 g/dL (ref 13.2–17.1)
Lymphs Abs: 1282 cells/uL (ref 850–3900)
MCH: 30.7 pg (ref 27.0–33.0)
MCHC: 34 g/dL (ref 32.0–36.0)
MCV: 90.2 fL (ref 80.0–100.0)
MPV: 10.6 fL (ref 7.5–12.5)
Monocytes Relative: 11.2 %
Neutro Abs: 2784 cells/uL (ref 1500–7800)
Neutrophils Relative %: 58 %
Platelets: 189 10*3/uL (ref 140–400)
RBC: 4.89 10*6/uL (ref 4.20–5.80)
RDW: 13.5 % (ref 11.0–15.0)
Total Lymphocyte: 26.7 %
WBC: 4.8 10*3/uL (ref 3.8–10.8)

## 2019-11-11 LAB — TSH: TSH: 5.02 mIU/L — ABNORMAL HIGH (ref 0.40–4.50)

## 2019-11-11 LAB — HEMOGLOBIN A1C
Hgb A1c MFr Bld: 5.7 % of total Hgb — ABNORMAL HIGH (ref <5.7)
Mean Plasma Glucose: 117 (calc)
eAG (mmol/L): 6.5 (calc)

## 2019-11-11 LAB — COMPLETE METABOLIC PANEL WITH GFR
AG Ratio: 2.1 (calc) (ref 1.0–2.5)
ALT: 12 U/L (ref 9–46)
AST: 14 U/L (ref 10–35)
Albumin: 4 g/dL (ref 3.6–5.1)
Alkaline phosphatase (APISO): 53 U/L (ref 35–144)
BUN/Creatinine Ratio: 16 (calc) (ref 6–22)
BUN: 21 mg/dL (ref 7–25)
CO2: 29 mmol/L (ref 20–32)
Calcium: 9.1 mg/dL (ref 8.6–10.3)
Chloride: 105 mmol/L (ref 98–110)
Creat: 1.28 mg/dL — ABNORMAL HIGH (ref 0.70–1.18)
GFR, Est African American: 63 mL/min/{1.73_m2} (ref >=60)
GFR, Est Non African American: 55 mL/min/{1.73_m2} — ABNORMAL LOW (ref >=60)
Globulin: 1.9 g/dL (calc) (ref 1.9–3.7)
Glucose, Bld: 79 mg/dL (ref 65–99)
Potassium: 4.7 mmol/L (ref 3.5–5.3)
Sodium: 140 mmol/L (ref 135–146)
Total Bilirubin: 0.6 mg/dL (ref 0.2–1.2)
Total Protein: 5.9 g/dL — ABNORMAL LOW (ref 6.1–8.1)

## 2019-11-11 LAB — VITAMIN B12: Vitamin B-12: 300 pg/mL (ref 200–1100)

## 2019-11-13 ENCOUNTER — Encounter: Payer: Self-pay | Admitting: Family Medicine

## 2019-11-24 ENCOUNTER — Ambulatory Visit: Payer: Medicare Other | Attending: Internal Medicine

## 2019-11-24 DIAGNOSIS — Z23 Encounter for immunization: Secondary | ICD-10-CM

## 2019-11-24 NOTE — Progress Notes (Signed)
   Covid-19 Vaccination Clinic  Name:  Kevin Vazquez    MRN: [id number] DOB: 30-Oct-1945  11/24/2019  [name] was observed post Covid-19 immunization for 30 minutes based on pre-vaccination screening without incident. He was provided with Vaccine Information Sheet and instruction to access the V-Safe system.   [name] was instructed to call 911 with any severe reactions post vaccine: Marland Kitchen Difficulty breathing  . Swelling of face and throat  . A fast heartbeat  . A bad rash all over body  . Dizziness and weakness   Immunizations Administered    Name Date Dose VIS Date Route   Pfizer COVID-19 Vaccine 11/24/2019 10:52 AM 0.3 mL 09/04/2019 Intramuscular   Manufacturer: [place]   Lot: [id number]   [place]: [id number]

## 2019-12-21 ENCOUNTER — Other Ambulatory Visit: Payer: Self-pay

## 2019-12-21 ENCOUNTER — Ambulatory Visit (INDEPENDENT_AMBULATORY_CARE_PROVIDER_SITE_OTHER): Payer: Medicare Other | Admitting: *Deleted

## 2019-12-21 DIAGNOSIS — Z5181 Encounter for therapeutic drug level monitoring: Secondary | ICD-10-CM

## 2019-12-21 DIAGNOSIS — I4891 Unspecified atrial fibrillation: Secondary | ICD-10-CM

## 2019-12-21 LAB — POCT INR: INR: 2.2 (ref 2.0–3.0)

## 2019-12-21 NOTE — Patient Instructions (Signed)
Continue 1 1/2 tablets daily °Recheck in 6 weeks °

## 2020-01-05 ENCOUNTER — Other Ambulatory Visit: Payer: Self-pay | Admitting: Family Medicine

## 2020-02-01 ENCOUNTER — Other Ambulatory Visit: Payer: Self-pay

## 2020-02-01 ENCOUNTER — Ambulatory Visit (INDEPENDENT_AMBULATORY_CARE_PROVIDER_SITE_OTHER): Payer: Medicare Other | Admitting: *Deleted

## 2020-02-01 DIAGNOSIS — I4891 Unspecified atrial fibrillation: Secondary | ICD-10-CM | POA: Diagnosis not present

## 2020-02-01 DIAGNOSIS — Z5181 Encounter for therapeutic drug level monitoring: Secondary | ICD-10-CM

## 2020-02-01 LAB — POCT INR: INR: 2.5 (ref 2.0–3.0)

## 2020-02-01 NOTE — Patient Instructions (Signed)
Continue 1 1/2 tablets daily °Recheck in 6 weeks °

## 2020-03-18 ENCOUNTER — Ambulatory Visit: Payer: Medicare Other | Admitting: Physician Assistant

## 2020-03-21 ENCOUNTER — Other Ambulatory Visit: Payer: Self-pay | Admitting: Family Medicine

## 2020-03-22 ENCOUNTER — Ambulatory Visit (INDEPENDENT_AMBULATORY_CARE_PROVIDER_SITE_OTHER): Payer: Medicare Other | Admitting: *Deleted

## 2020-03-22 DIAGNOSIS — I4891 Unspecified atrial fibrillation: Secondary | ICD-10-CM | POA: Diagnosis not present

## 2020-03-22 DIAGNOSIS — Z5181 Encounter for therapeutic drug level monitoring: Secondary | ICD-10-CM

## 2020-03-22 LAB — POCT INR: INR: 2.4 (ref 2.0–3.0)

## 2020-03-22 NOTE — Patient Instructions (Signed)
Continue 1 1/2 tablets daily °Recheck in 6 weeks °

## 2020-04-07 ENCOUNTER — Ambulatory Visit: Payer: Medicare Other | Admitting: Physician Assistant

## 2020-05-03 ENCOUNTER — Ambulatory Visit (INDEPENDENT_AMBULATORY_CARE_PROVIDER_SITE_OTHER): Payer: Medicare Other | Admitting: *Deleted

## 2020-05-03 DIAGNOSIS — I4891 Unspecified atrial fibrillation: Secondary | ICD-10-CM

## 2020-05-03 DIAGNOSIS — Z5181 Encounter for therapeutic drug level monitoring: Secondary | ICD-10-CM | POA: Diagnosis not present

## 2020-05-03 LAB — POCT INR: INR: 2.4 (ref 2.0–3.0)

## 2020-05-03 NOTE — Patient Instructions (Signed)
Continue 1 1/2 tablets daily °Recheck in 6 weeks °

## 2020-06-03 ENCOUNTER — Other Ambulatory Visit: Payer: Self-pay

## 2020-06-03 ENCOUNTER — Ambulatory Visit: Payer: Medicare Other | Admitting: Physician Assistant

## 2020-06-03 ENCOUNTER — Encounter: Payer: Self-pay | Admitting: Physician Assistant

## 2020-06-03 DIAGNOSIS — L570 Actinic keratosis: Secondary | ICD-10-CM | POA: Diagnosis not present

## 2020-06-03 DIAGNOSIS — Z1283 Encounter for screening for malignant neoplasm of skin: Secondary | ICD-10-CM | POA: Diagnosis not present

## 2020-06-03 DIAGNOSIS — D485 Neoplasm of uncertain behavior of skin: Secondary | ICD-10-CM | POA: Diagnosis not present

## 2020-06-03 DIAGNOSIS — Z85820 Personal history of malignant melanoma of skin: Secondary | ICD-10-CM | POA: Diagnosis not present

## 2020-06-03 DIAGNOSIS — Z85828 Personal history of other malignant neoplasm of skin: Secondary | ICD-10-CM

## 2020-06-03 DIAGNOSIS — D1809 Hemangioma of other sites: Secondary | ICD-10-CM | POA: Diagnosis not present

## 2020-06-03 DIAGNOSIS — Z86007 Personal history of in-situ neoplasm of skin: Secondary | ICD-10-CM

## 2020-06-03 NOTE — Progress Notes (Addendum)
Follow-Up Visit   Subjective  Kevin Vazquez is a 74 y.o. male who presents for the following: Annual Exam (skin check--concern spots on the face and knots at the back of the head).   The following portions of the chart were reviewed this encounter and updated as appropriate:     Objective  Well appearing patient in no apparent distress; mood and affect are within normal limits.  A full examination was performed including scalp, head, eyes, ears, nose, lips, neck, chest, axillae, abdomen, back, buttocks, bilateral upper extremities, bilateral lower extremities, hands, feet, fingers, toes, fingernails, and toenails. All findings within normal limits unless otherwise noted below.  Objective  Right Upper Back sholder: Level 3 - Dr. Clovis Riley. No atypical nevi. Scar clear   Objective  Left Forearm - Anterior, Left Forehead, Left Temporal Scalp, Left inner arm: Scars clear  Objective  Left Upper Cutaneous Lip: Scar clear  Objective  Mid Back: Full body skin exam.   Objective  Left Chin: Hyperkeratotic scale with pink base      Objective  Left Frontal Scalp (3), Left Parotid Area, Right Buccal Cheek  (8), Right Temporal Scalp: Erythematous patches with gritty scale.  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) Left inner arm; Left Forearm - Anterior; Left Forehead; Left Temporal Scalp  observe  History of basal cell carcinoma (BCC) of skin Left Upper Cutaneous Lip  observe  Screening for malignant neoplasm of skin Mid Back  Neoplasm of uncertain behavior of skin Left Chin  Skin / nail biopsy Type of biopsy: tangential   Informed consent: discussed and consent obtained   Timeout: patient name, date of birth, surgical site, and procedure verified   Procedure prep:  Patient was prepped and draped in usual sterile fashion (Non sterile) Prep type:   Chlorhexidine Anesthesia: the lesion was anesthetized in a standard fashion   Anesthetic:  1% lidocaine w/ epinephrine 1-100,000 local infiltration Instrument used: flexible razor blade   Outcome: patient tolerated procedure well   Post-procedure details: wound care instructions given    Destruction of lesion Complexity: simple   Destruction method: electrodesiccation and curettage   Informed consent: discussed and consent obtained   Timeout:  patient name, date of birth, surgical site, and procedure verified Anesthesia: the lesion was anesthetized in a standard fashion   Anesthetic:  1% lidocaine w/ epinephrine 1-100,000 local infiltration Curettage performed in three different directions: Yes   Electrodesiccation performed over the curetted area: Yes   Curettage cycles:  3 Margin per side (cm):  0.1 Final wound size (cm):  1 Hemostasis achieved with:  aluminum chloride Outcome: patient tolerated procedure well with no complications   Post-procedure details: wound care instructions given    Specimen 1 - Surgical pathology Differential Diagnosis: scc vs bcc Check Margins: No Cautery only    AK (actinic keratosis) (13) Left Frontal Scalp (3); Left Parotid Area; Right Buccal Cheek  (8); Right Temporal Scalp  Destruction of lesion - Left Frontal Scalp, Left Parotid Area, Right Buccal Cheek , Right Temporal Scalp Complexity: simple   Destruction method: cryotherapy   Informed consent: discussed and consent obtained   Timeout:  patient name, date of birth, surgical site, and procedure verified Lesion destroyed using liquid nitrogen: Yes   Outcome: patient tolerated procedure well with no complications     I, Jonisha Kindig, PA-C, have reviewed all documentation's for this visit.  The documentation on 06/15/20 for the exam, diagnosis, procedures and orders are all accurate and complete.

## 2020-06-03 NOTE — Patient Instructions (Signed)

## 2020-06-14 ENCOUNTER — Ambulatory Visit (INDEPENDENT_AMBULATORY_CARE_PROVIDER_SITE_OTHER): Payer: Medicare Other | Admitting: *Deleted

## 2020-06-14 DIAGNOSIS — Z5181 Encounter for therapeutic drug level monitoring: Secondary | ICD-10-CM | POA: Diagnosis not present

## 2020-06-14 DIAGNOSIS — I4891 Unspecified atrial fibrillation: Secondary | ICD-10-CM

## 2020-06-14 LAB — POCT INR: INR: 2.3 (ref 2.0–3.0)

## 2020-06-14 NOTE — Patient Instructions (Signed)
Continue 1 1/2 tablets daily °Recheck in 6 weeks °

## 2020-07-26 ENCOUNTER — Ambulatory Visit (INDEPENDENT_AMBULATORY_CARE_PROVIDER_SITE_OTHER): Payer: Medicare Other | Admitting: *Deleted

## 2020-07-26 DIAGNOSIS — Z5181 Encounter for therapeutic drug level monitoring: Secondary | ICD-10-CM

## 2020-07-26 DIAGNOSIS — I4891 Unspecified atrial fibrillation: Secondary | ICD-10-CM

## 2020-07-26 LAB — POCT INR: INR: 3 (ref 2.0–3.0)

## 2020-07-26 NOTE — Patient Instructions (Signed)
Continue 1 1/2 tablets daily Recheck in 6 weeks

## 2020-08-12 ENCOUNTER — Ambulatory Visit (INDEPENDENT_AMBULATORY_CARE_PROVIDER_SITE_OTHER): Payer: Medicare Other | Admitting: Internal Medicine

## 2020-08-12 ENCOUNTER — Other Ambulatory Visit: Payer: Self-pay

## 2020-08-12 ENCOUNTER — Encounter: Payer: Self-pay | Admitting: Internal Medicine

## 2020-08-12 VITALS — BP 118/78 | HR 95 | Ht 70.0 in | Wt 240.0 lb

## 2020-08-12 DIAGNOSIS — Z950 Presence of cardiac pacemaker: Secondary | ICD-10-CM

## 2020-08-12 DIAGNOSIS — I442 Atrioventricular block, complete: Secondary | ICD-10-CM

## 2020-08-12 DIAGNOSIS — I4891 Unspecified atrial fibrillation: Secondary | ICD-10-CM

## 2020-08-12 LAB — CUP PACEART INCLINIC DEVICE CHECK
Battery Impedance: 6600 Ohm
Battery Remaining Longevity: 3 mo
Battery Voltage: 2.62 V
Brady Statistic RV Percent Paced: 10 %
Date Time Interrogation Session: 20211119093049
Implantable Lead Implant Date: 20020722
Implantable Lead Location: 753860
Implantable Lead Model: 5092
Implantable Pulse Generator Implant Date: 20110801
Lead Channel Impedance Value: 0 Ohm
Lead Channel Impedance Value: 730 Ohm
Lead Channel Pacing Threshold Amplitude: 0.75 V
Lead Channel Pacing Threshold Pulse Width: 0.4 ms
Lead Channel Sensing Intrinsic Amplitude: 8 mV
Lead Channel Setting Pacing Amplitude: 2.5 V
Lead Channel Setting Pacing Pulse Width: 0.4 ms
Lead Channel Setting Sensing Sensitivity: 4 mV

## 2020-08-12 NOTE — Patient Instructions (Signed)
Medication Instructions:  Your physician recommends that you continue on your current medications as directed. Please refer to the Current Medication list given to you today.  *If you need a refill on your cardiac medications before your next appointment, please call your pharmacy*   Lab Work: NONE   If you have labs (blood work) drawn today and your tests are completely normal, you will receive your results only by: Marland Kitchen MyChart Message (if you have MyChart) OR . A paper copy in the mail If you have any lab test that is abnormal or we need to change your treatment, we will call you to review the results.   Testing/Procedures: NONE    Follow-Up: At [facility], you and your health needs are our priority.  As part of our continuing mission to provide you with exceptional heart care, we have created designated Provider Care Teams.  These Care Teams include your primary Cardiologist (physician) and Advanced Practice Providers (APPs -  Physician Assistants and Nurse Practitioners) who all work together to provide you with the care you need, when you need it.  We recommend signing up for the patient portal called "MyChart".  Sign up information is provided on this After Visit Summary.  MyChart is used to connect with patients for Virtual Visits (Telemedicine).  Patients are able to view lab/test results, encounter notes, upcoming appointments, etc.  Non-urgent messages can be sent to your provider as well.   To learn more about what you can do with MyChart, go to [email or web address].    Your next appointment:   2 month(s)  The format for your next appointment:   In Person  Provider:   Device clinic    Other Instructions Thank you for choosing [facility]!

## 2020-08-12 NOTE — Progress Notes (Signed)
HPI  [name] returns today for ongoing evaluation of chronic atrial fibrillation, complete heart block, status post pacemaker insertion, and hypertension.  The patient has done well in the interim. He notes that when he is doing something strenuous, his heart will speed up and he will feel the palpitations. However, this has improved some since his toprol was started.  He has not had syncope.  He denies chest pain.  He has class IIa dyspnea.  No peripheral edema.  He remains active maintaining rental houses. No Known Allergies   Current Outpatient Medications  Medication Sig Dispense Refill  . diltiazem (CARDIZEM) 30 MG tablet TAKE 1 TABLET BY MOUTH  DAILY AND PER DR. Lovena Le  MAY TAKE 1 ADDITIONAL  TABLET DAILY AS NEEDED 180 tablet 3  . metoprolol succinate (TOPROL-XL) 25 MG 24 hr tablet TAKE ONE-HALF TABLET BY  MOUTH DAILY 45 tablet 3  . minocycline (MINOCIN) 50 MG capsule Take 50 mg by mouth daily.    Marland Kitchen warfarin (COUMADIN) 5 MG tablet TAKE 1 AND 1/2 TO 2 TABLETS BY MOUTH DAILY OR AS  DIRECTED 180 tablet 3   No current facility-administered medications for this visit.     Past Medical History:  Diagnosis Date  . Atrial fibrillation, permanent ([place])   . Basal cell carcinoma 04/02/2017   Left upper lip - CX3 + cautery + 5FU  . BPH (benign prostatic hyperplasia)   . Melanoma ([place]) 08/31/2004   Level III - upper right back shoulder (Dr. Clovis Riley)  . Sick sinus syndrome ([place])   . Squamous cell carcinoma of skin 04/02/2017   Left temple - CX3 + cautery + 5FU  . Squamous cell carcinoma of skin 04/11/2018   Left forearm - CX3 + cautery + 5FU  . Squamous cell carcinoma of skin 07/19/20019   Left forehead - CX3 + cautery + 5FU  . Squamous cell carcinoma of skin 09/16/2019   Left inner arm - tx p bx    ROS:   All systems reviewed and negative except as noted in the HPI.   Past Surgical History:  Procedure Laterality Date  . CARDIAC CATHETERIZATION  01/26/1997   Normal LV  function. Normal coronary arteries.  Marland Kitchen CARDIOVASCULAR STRESS TEST  05/02/2011   Normal perfusion scan demonstrating diaphragmatic artifact. No significant ischemia demonstrated   . LEFT LOWER EXTREMITY VENOUS DOPPLER  02/07/2009   3.8x1.3x2.8cm hypoechoic area in lateral L thigh. No evidence of DVT.  Marland Kitchen PACEMAKER INSERTION  04/24/2010   Medtronic Adapta, model #ADSRO1, serial [id number]  . TRANSTHORACIC ECHOCARDIOGRAM  11/26/2012   EF 50-55%, LA-appendage was moderately dilated     Family History  Problem Relation Age of Onset  . Heart disease Other      Social History   Socioeconomic History  . Marital status: Married    Spouse name: Not on file  . Number of children: Not on file  . Years of education: Not on file  . Highest education level: Not on file  Occupational History  . Occupation: Dealer Retired  Tobacco Use  . Smoking status: Never Smoker  . Smokeless tobacco: Former Systems developer    Types: Secondary school teacher  . Vaping Use: Never used  Substance and Sexual Activity  . Alcohol use: Yes    Alcohol/week: 7.0 standard drinks    Types: 7 Standard drinks or equivalent per week  . Drug use: No  . Sexual activity: Not on file  Other Topics Concern  . Not  on file  Social History Narrative  . Not on file   Social Determinants of Health   Financial Resource Strain:   . Difficulty of Paying Living Expenses: Not on file  Food Insecurity:   . Worried About Charity fundraiser in the Last Year: Not on file  . Ran Out of Food in the Last Year: Not on file  Transportation Needs:   . Lack of Transportation (Medical): Not on file  . Lack of Transportation (Non-Medical): Not on file  Physical Activity:   . Days of Exercise per Week: Not on file  . Minutes of Exercise per Session: Not on file  Stress:   . Feeling of Stress : Not on file  Social Connections:   . Frequency of Communication with Friends and Family: Not on file  . Frequency of Social Gatherings with Friends and Family:  Not on file  . Attends Religious Services: Not on file  . Active Member of Clubs or Organizations: Not on file  . Attends Archivist Meetings: Not on file  . Marital Status: Not on file  Intimate Partner Violence:   . Fear of Current or Ex-Partner: Not on file  . Emotionally Abused: Not on file  . Physically Abused: Not on file  . Sexually Abused: Not on file     BP 118/78   Pulse 95   Ht 5\' 10"  (1.778 m)   Wt 240 lb (108.9 kg)   SpO2 98%   BMI 34.44 kg/m   Physical Exam:  Well appearing NAD HEENT: Unremarkable Neck:  No JVD, no thyromegally Lymphatics:  No adenopathy Back:  No CVA tenderness Lungs:  Clear HEART:  IRegular rate rhythm, no murmurs, no rubs, no clicks Abd:  soft, positive bowel sounds, no organomegally, no rebound, no guarding Ext:  2 plus pulses, no edema, no cyanosis, no clubbing Skin:  No rashes no nodules Neuro:  CN II through XII intact, motor grossly intact  EKG - atrial fib with a CVR  DEVICE  Normal device function.  See PaceArt for details.   Assess/Plan: 1.  Chronic atrial fibrillation -his ventricular rate has been better controlled.  We discussed the treatment options.  We will continue him on low-dose Toprol 12.5 mg once daily. 2.  Pacemaker -his Medtronic single-chamber pacemaker is working normally.  He has approximately 3 months of battery longevity.  He is only pacing approximately 10% of the time. 3.  Hypertension -his blood pressure is well controlled.  I would like to increase his dose of toprol but am concerned about the pressure being too low. Will continue low dose toprol 4.  Coags -he is tolerating systemic anticoagulation with warfarin.  He will continue to follow-up in our Coumadin clinic.  Carleene Overlie Breiana Stratmann,MD

## 2020-09-06 ENCOUNTER — Ambulatory Visit (INDEPENDENT_AMBULATORY_CARE_PROVIDER_SITE_OTHER): Payer: Medicare Other | Admitting: *Deleted

## 2020-09-06 DIAGNOSIS — I4891 Unspecified atrial fibrillation: Secondary | ICD-10-CM

## 2020-09-06 DIAGNOSIS — Z5181 Encounter for therapeutic drug level monitoring: Secondary | ICD-10-CM | POA: Diagnosis not present

## 2020-09-06 LAB — POCT INR: INR: 2.2 (ref 2.0–3.0)

## 2020-09-06 NOTE — Patient Instructions (Signed)
Continue 1 1/2 tablets daily Recheck in 6 weeks

## 2020-09-28 ENCOUNTER — Other Ambulatory Visit: Payer: Self-pay

## 2020-09-28 ENCOUNTER — Ambulatory Visit (INDEPENDENT_AMBULATORY_CARE_PROVIDER_SITE_OTHER): Payer: Medicare Other | Admitting: Emergency Medicine

## 2020-09-28 ENCOUNTER — Ambulatory Visit (INDEPENDENT_AMBULATORY_CARE_PROVIDER_SITE_OTHER): Payer: Medicare Other | Admitting: Internal Medicine

## 2020-09-28 ENCOUNTER — Encounter: Payer: Self-pay | Admitting: Internal Medicine

## 2020-09-28 ENCOUNTER — Encounter: Payer: Self-pay | Admitting: *Deleted

## 2020-09-28 VITALS — BP 122/80 | HR 72 | Ht 70.0 in | Wt 239.9 lb

## 2020-09-28 DIAGNOSIS — I442 Atrioventricular block, complete: Secondary | ICD-10-CM | POA: Diagnosis not present

## 2020-09-28 DIAGNOSIS — Z01818 Encounter for other preprocedural examination: Secondary | ICD-10-CM

## 2020-09-28 DIAGNOSIS — I4891 Unspecified atrial fibrillation: Secondary | ICD-10-CM

## 2020-09-28 LAB — CUP PACEART INCLINIC DEVICE CHECK
Date Time Interrogation Session: 20220105095818
Implantable Lead Implant Date: 20020722
Implantable Lead Location: 753860
Implantable Lead Model: 5092
Implantable Pulse Generator Implant Date: 20110801

## 2020-09-28 NOTE — Progress Notes (Signed)
Battery check completed in Device Clinic [facility]). Battery has reached RRT as of 09/10/20. Dr. Ladona Ridgel aware and will have patient scheduled for gen change.  See scanned report.

## 2020-09-28 NOTE — Progress Notes (Signed)
HPI [name] returns today for ongoing evaluation of chronic atrial fibrillation, complete heart block, status post pacemaker insertion, and hypertension. The patient has done well in the interim.He notes that when he is doing something strenuous, his heart will speed up and he will feel the palpitations. However, this has improved some since his toprol was started. He has not had syncope. He denies chest pain. He has class IIa dyspnea. No peripheral edema. He remains active maintaining rental houses. He has reached ERI and will undergo PM gen change out.  No Known Allergies   Current Outpatient Medications  Medication Sig Dispense Refill  . diltiazem (CARDIZEM) 30 MG tablet TAKE 1 TABLET BY MOUTH  DAILY AND PER DR. Lovena Le  MAY TAKE 1 ADDITIONAL  TABLET DAILY AS NEEDED 180 tablet 3  . metoprolol succinate (TOPROL-XL) 25 MG 24 hr tablet TAKE ONE-HALF TABLET BY  MOUTH DAILY 45 tablet 3  . minocycline (MINOCIN) 50 MG capsule Take 50 mg by mouth daily.    Marland Kitchen warfarin (COUMADIN) 5 MG tablet TAKE 1 AND 1/2 TO 2 TABLETS BY MOUTH DAILY OR AS  DIRECTED 180 tablet 3   No current facility-administered medications for this visit.     Past Medical History:  Diagnosis Date  . Atrial fibrillation, permanent ([place])   . Basal cell carcinoma 04/02/2017   Left upper lip - CX3 + cautery + 5FU  . BPH (benign prostatic hyperplasia)   . Melanoma ([place]) 08/31/2004   Level III - upper right back shoulder (Dr. Clovis Riley)  . Sick sinus syndrome ([place])   . Squamous cell carcinoma of skin 04/02/2017   Left temple - CX3 + cautery + 5FU  . Squamous cell carcinoma of skin 04/11/2018   Left forearm - CX3 + cautery + 5FU  . Squamous cell carcinoma of skin 07/19/20019   Left forehead - CX3 + cautery + 5FU  . Squamous cell carcinoma of skin 09/16/2019   Left inner arm - tx p bx    ROS:   All systems reviewed and negative except as noted in the HPI.   Past Surgical History:  Procedure Laterality Date   . CARDIAC CATHETERIZATION  01/26/1997   Normal LV function. Normal coronary arteries.  Marland Kitchen CARDIOVASCULAR STRESS TEST  05/02/2011   Normal perfusion scan demonstrating diaphragmatic artifact. No significant ischemia demonstrated   . LEFT LOWER EXTREMITY VENOUS DOPPLER  02/07/2009   3.8x1.3x2.8cm hypoechoic area in lateral L thigh. No evidence of DVT.  Marland Kitchen PACEMAKER INSERTION  04/24/2010   Medtronic Adapta, model #ADSRO1, serial [id number]  . TRANSTHORACIC ECHOCARDIOGRAM  11/26/2012   EF 50-55%, LA-appendage was moderately dilated     Family History  Problem Relation Age of Onset  . Heart disease Other      Social History   Socioeconomic History  . Marital status: Married    Spouse name: Not on file  . Number of children: Not on file  . Years of education: Not on file  . Highest education level: Not on file  Occupational History  . Occupation: Dealer Retired  Tobacco Use  . Smoking status: Never Smoker  . Smokeless tobacco: Former Systems developer    Types: Secondary school teacher  . Vaping Use: Never used  Substance and Sexual Activity  . Alcohol use: Yes    Alcohol/week: 7.0 standard drinks    Types: 7 Standard drinks or equivalent per week  . Drug use: No  . Sexual activity: Not on file  Other Topics  Concern  . Not on file  Social History Narrative  . Not on file   Social Determinants of Health   Financial Resource Strain: Not on file  Food Insecurity: Not on file  Transportation Needs: Not on file  Physical Activity: Not on file  Stress: Not on file  Social Connections: Not on file  Intimate Partner Violence: Not on file     BP 122/80   Pulse 72   Ht 5\' 10"  (1.778 m)   Wt 239 lb 14.4 oz (108.8 kg)   SpO2 98%   BMI 34.42 kg/m   Physical Exam:  Well appearing NAD HEENT: Unremarkable Neck:  No JVD, no thyromegally Lymphatics:  No adenopathy Back:  No CVA tenderness Lungs:  Clear with no wheezes HEART:  Regular rate rhythm, no murmurs, no rubs, no clicks Abd:  soft,  positive bowel sounds, no organomegally, no rebound, no guarding Ext:  2 plus pulses, no edema, no cyanosis, no clubbing Skin:  No rashes no nodules Neuro:  CN II through XII intact, motor grossly intact   DEVICE  Normal device function.  See PaceArt for details. ERI.   Assess/Plan: 1. PPM -his device is at RRT. I have discussed the indications/risks/benefits/goals/expectations and he wishes to proceed. 2. Chronic atrial fib - his VR is well controlled. No change in his meds.  3. HTN - his bp is well controlled. No change in his meds. 4. Coags - he will hold his warfarin for 2 days prior to his procedure.  Nixie Laube,MD

## 2020-09-28 NOTE — H&P (View-Only) (Signed)
HPI Kevin Vazquez returns today for ongoing evaluation of chronic atrial fibrillation, complete heart block, status post pacemaker insertion, and hypertension. The patient has done well in the interim.He notes that when he is doing something strenuous, his heart will speed up and he will feel the palpitations. However, this has improved some since his toprol was started. He has not had syncope. He denies chest pain. He has class IIa dyspnea. No peripheral edema. He remains active maintaining rental houses. He has reached ERI and will undergo PM gen change out.  No Known Allergies   Current Outpatient Medications  Medication Sig Dispense Refill  . diltiazem (CARDIZEM) 30 MG tablet TAKE 1 TABLET BY MOUTH  DAILY AND PER DR. Lovena Le  MAY TAKE 1 ADDITIONAL  TABLET DAILY AS NEEDED 180 tablet 3  . metoprolol succinate (TOPROL-XL) 25 MG 24 hr tablet TAKE ONE-HALF TABLET BY  MOUTH DAILY 45 tablet 3  . minocycline (MINOCIN) 50 MG capsule Take 50 mg by mouth daily.    Marland Kitchen warfarin (COUMADIN) 5 MG tablet TAKE 1 AND 1/2 TO 2 TABLETS BY MOUTH DAILY OR AS  DIRECTED 180 tablet 3   No current facility-administered medications for this visit.     Past Medical History:  Diagnosis Date  . Atrial fibrillation, permanent ([place])   . Basal cell carcinoma 04/02/2017   Left upper lip - CX3 + cautery + 5FU  . BPH (benign prostatic hyperplasia)   . Melanoma ([place]) 08/31/2004   Level III - upper right back shoulder (Dr. Clovis Riley)  . Sick sinus syndrome ([place])   . Squamous cell carcinoma of skin 04/02/2017   Left temple - CX3 + cautery + 5FU  . Squamous cell carcinoma of skin 04/11/2018   Left forearm - CX3 + cautery + 5FU  . Squamous cell carcinoma of skin 07/19/20019   Left forehead - CX3 + cautery + 5FU  . Squamous cell carcinoma of skin 09/16/2019   Left inner arm - tx p bx    ROS:   All systems reviewed and negative except as noted in the HPI.   Past Surgical History:  Procedure Laterality Date   . CARDIAC CATHETERIZATION  01/26/1997   Normal LV function. Normal coronary arteries.  Marland Kitchen CARDIOVASCULAR STRESS TEST  05/02/2011   Normal perfusion scan demonstrating diaphragmatic artifact. No significant ischemia demonstrated   . LEFT LOWER EXTREMITY VENOUS DOPPLER  02/07/2009   3.8x1.3x2.8cm hypoechoic area in lateral L thigh. No evidence of DVT.  Marland Kitchen PACEMAKER INSERTION  04/24/2010   Medtronic Adapta, model #ADSRO1, serial [id number]  . TRANSTHORACIC ECHOCARDIOGRAM  11/26/2012   EF 50-55%, LA-appendage was moderately dilated     Family History  Problem Relation Age of Onset  . Heart disease Other      Social History   Socioeconomic History  . Marital status: Married    Spouse name: Not on file  . Number of children: Not on file  . Years of education: Not on file  . Highest education level: Not on file  Occupational History  . Occupation: Dealer Retired  Tobacco Use  . Smoking status: Never Smoker  . Smokeless tobacco: Former Systems developer    Types: Secondary school teacher  . Vaping Use: Never used  Substance and Sexual Activity  . Alcohol use: Yes    Alcohol/week: 7.0 standard drinks    Types: 7 Standard drinks or equivalent per week  . Drug use: No  . Sexual activity: Not on file  Other Topics  Concern  . Not on file  Social History Narrative  . Not on file   Social Determinants of Health   Financial Resource Strain: Not on file  Food Insecurity: Not on file  Transportation Needs: Not on file  Physical Activity: Not on file  Stress: Not on file  Social Connections: Not on file  Intimate Partner Violence: Not on file     BP 122/80   Pulse 72   Ht 5\' 10"  (1.778 m)   Wt 239 lb 14.4 oz (108.8 kg)   SpO2 98%   BMI 34.42 kg/m   Physical Exam:  Well appearing NAD HEENT: Unremarkable Neck:  No JVD, no thyromegally Lymphatics:  No adenopathy Back:  No CVA tenderness Lungs:  Clear with no wheezes HEART:  Regular rate rhythm, no murmurs, no rubs, no clicks Abd:  soft,  positive bowel sounds, no organomegally, no rebound, no guarding Ext:  2 plus pulses, no edema, no cyanosis, no clubbing Skin:  No rashes no nodules Neuro:  CN II through XII intact, motor grossly intact   DEVICE  Normal device function.  See PaceArt for details. ERI.   Assess/Plan: 1. PPM -his device is at RRT. I have discussed the indications/risks/benefits/goals/expectations and he wishes to proceed. 2. Chronic atrial fib - his VR is well controlled. No change in his meds.  3. HTN - his bp is well controlled. No change in his meds. 4. Coags - he will hold his warfarin for 2 days prior to his procedure.  Belladonna Lubinski,MD

## 2020-10-18 ENCOUNTER — Ambulatory Visit (INDEPENDENT_AMBULATORY_CARE_PROVIDER_SITE_OTHER): Payer: Medicare Other | Admitting: *Deleted

## 2020-10-18 DIAGNOSIS — I4891 Unspecified atrial fibrillation: Secondary | ICD-10-CM

## 2020-10-18 DIAGNOSIS — Z5181 Encounter for therapeutic drug level monitoring: Secondary | ICD-10-CM | POA: Diagnosis not present

## 2020-10-18 LAB — POCT INR: INR: 1.8 — AB (ref 2.0–3.0)

## 2020-10-18 NOTE — Patient Instructions (Signed)
Take warfarin 2 tablets tonight then resume 1 1/2 tablets daily Scheduled for PM generator change out on 1/31.  Will take last dose of warfarin on 1/28 and resume night of procedure. Recheck 2 wks after procedure

## 2020-10-19 ENCOUNTER — Telehealth: Payer: Self-pay | Admitting: Internal Medicine

## 2020-10-19 NOTE — Telephone Encounter (Signed)
Contacted patient and verified that he will be able to get blood work done on 10/20/2020 before his procedural covid test on  10/21/2020. Patient verbalized understanding of quarantining after his covid test for his procedure Monday

## 2020-10-19 NOTE — Telephone Encounter (Signed)
Patient called wanting to know if he can have his blood work done on Thursday 10-20-2020 since he is having his COVID test on Friday.

## 2020-10-20 ENCOUNTER — Other Ambulatory Visit (HOSPITAL_COMMUNITY)
Admission: RE | Admit: 2020-10-20 | Discharge: 2020-10-20 | Disposition: A | Discharge status: Home / Self Care | Payer: Medicare Other | Source: Ambulatory Visit | Attending: Internal Medicine | Admitting: Internal Medicine

## 2020-10-20 DIAGNOSIS — Z5181 Encounter for therapeutic drug level monitoring: Secondary | ICD-10-CM | POA: Diagnosis not present

## 2020-10-20 DIAGNOSIS — I4891 Unspecified atrial fibrillation: Secondary | ICD-10-CM | POA: Diagnosis not present

## 2020-10-20 LAB — CBC
HCT: 46.2 % (ref 39.0–52.0)
Hemoglobin: 15.2 g/dL (ref 13.0–17.0)
MCH: 30.6 pg (ref 26.0–34.0)
MCHC: 32.9 g/dL (ref 30.0–36.0)
MCV: 93.1 fL (ref 80.0–100.0)
Platelets: 200 10*3/uL (ref 150–400)
RBC: 4.96 MIL/uL (ref 4.22–5.81)
RDW: 13.8 % (ref 11.5–15.5)
WBC: 5.2 10*3/uL (ref 4.0–10.5)
nRBC: 0 % (ref 0.0–0.2)

## 2020-10-20 LAB — BASIC METABOLIC PANEL
Anion gap: 6 (ref 5–15)
BUN: 21 mg/dL (ref 8–23)
CO2: 27 mmol/L (ref 22–32)
Calcium: 8.9 mg/dL (ref 8.9–10.3)
Chloride: 102 mmol/L (ref 98–111)
Creatinine, Ser: 1.33 mg/dL — ABNORMAL HIGH (ref 0.61–1.24)
GFR, Estimated: 56 mL/min — ABNORMAL LOW (ref >60)
Glucose, Bld: 91 mg/dL (ref 70–99)
Potassium: 4.5 mmol/L (ref 3.5–5.1)
Sodium: 135 mmol/L (ref 135–145)

## 2020-10-20 LAB — PROTIME-INR
INR: 1.7 — ABNORMAL HIGH (ref 0.8–1.2)
Prothrombin Time: 19.7 seconds — ABNORMAL HIGH (ref 11.4–15.2)

## 2020-10-21 ENCOUNTER — Other Ambulatory Visit (HOSPITAL_COMMUNITY)
Admission: RE | Admit: 2020-10-21 | Discharge: 2020-10-21 | Disposition: A | Discharge status: Home / Self Care | Payer: Medicare Other | Source: Ambulatory Visit | Attending: Internal Medicine | Admitting: Internal Medicine

## 2020-10-21 DIAGNOSIS — Z01812 Encounter for preprocedural laboratory examination: Secondary | ICD-10-CM | POA: Diagnosis not present

## 2020-10-21 DIAGNOSIS — Z20822 Contact with and (suspected) exposure to covid-19: Secondary | ICD-10-CM | POA: Diagnosis not present

## 2020-10-21 LAB — SARS CORONAVIRUS 2 (TAT 6-24 HRS): SARS Coronavirus 2: NEGATIVE

## 2020-10-21 NOTE — Progress Notes (Signed)
Attempted to call patient regarding procedure instructions for Monday's procedure.  No answer.

## 2020-10-24 ENCOUNTER — Ambulatory Visit (HOSPITAL_COMMUNITY)
Admission: RE | Disposition: A | Discharge status: Home / Self Care | Payer: Self-pay | Source: Home / Self Care | Attending: Internal Medicine

## 2020-10-24 ENCOUNTER — Encounter (HOSPITAL_COMMUNITY): Payer: Self-pay | Admitting: Internal Medicine

## 2020-10-24 ENCOUNTER — Ambulatory Visit (HOSPITAL_COMMUNITY)
Admission: RE | Admit: 2020-10-24 | Discharge: 2020-10-24 | Disposition: A | Discharge status: Home / Self Care | Payer: Medicare Other | Attending: Internal Medicine | Admitting: Internal Medicine

## 2020-10-24 ENCOUNTER — Other Ambulatory Visit: Payer: Self-pay

## 2020-10-24 DIAGNOSIS — I10 Essential (primary) hypertension: Secondary | ICD-10-CM | POA: Diagnosis not present

## 2020-10-24 DIAGNOSIS — I4821 Permanent atrial fibrillation: Secondary | ICD-10-CM | POA: Insufficient documentation

## 2020-10-24 DIAGNOSIS — Z45010 Encounter for checking and testing of cardiac pacemaker pulse generator [battery]: Secondary | ICD-10-CM | POA: Diagnosis not present

## 2020-10-24 DIAGNOSIS — Z79899 Other long term (current) drug therapy: Secondary | ICD-10-CM | POA: Insufficient documentation

## 2020-10-24 DIAGNOSIS — I442 Atrioventricular block, complete: Secondary | ICD-10-CM | POA: Diagnosis not present

## 2020-10-24 DIAGNOSIS — Z7901 Long term (current) use of anticoagulants: Secondary | ICD-10-CM | POA: Insufficient documentation

## 2020-10-24 DIAGNOSIS — I495 Sick sinus syndrome: Secondary | ICD-10-CM | POA: Diagnosis present

## 2020-10-24 HISTORY — PX: PPM GENERATOR CHANGEOUT: EP1233

## 2020-10-24 LAB — PROTIME-INR
INR: 1.3 — ABNORMAL HIGH (ref 0.8–1.2)
Prothrombin Time: 15.9 seconds — ABNORMAL HIGH (ref 11.4–15.2)

## 2020-10-24 SURGERY — PPM GENERATOR CHANGEOUT

## 2020-10-24 MED ORDER — SODIUM CHLORIDE 0.9 % IV SOLN
80.0000 mg | INTRAVENOUS | Status: AC
  Administered 2020-10-24: 80 mg

## 2020-10-24 MED ORDER — CHLORHEXIDINE GLUCONATE 4 % EX LIQD: 4.0000 "application " | Freq: Once | CUTANEOUS | Status: DC

## 2020-10-24 MED ORDER — MIDAZOLAM HCL 5 MG/5ML IJ SOLN
INTRAMUSCULAR | Status: AC
  Filled 2020-10-24: qty 5

## 2020-10-24 MED ORDER — MIDAZOLAM HCL 5 MG/5ML IJ SOLN
INTRAMUSCULAR | Status: DC | PRN
  Administered 2020-10-24 (×3): 1 mg via INTRAVENOUS

## 2020-10-24 MED ORDER — POVIDONE-IODINE 10 % EX SWAB: 2.0000 "application " | Freq: Once | CUTANEOUS | Status: DC

## 2020-10-24 MED ORDER — FENTANYL CITRATE (PF) 100 MCG/2ML IJ SOLN
INTRAMUSCULAR | Status: DC | PRN
  Administered 2020-10-24 (×3): 12.5 ug via INTRAVENOUS

## 2020-10-24 MED ORDER — CEFAZOLIN SODIUM-DEXTROSE 2-4 GM/100ML-% IV SOLN: 2.0000 g | INTRAVENOUS | Status: DC

## 2020-10-24 MED ORDER — SODIUM CHLORIDE 0.9 % IV SOLN: INTRAVENOUS | Status: DC

## 2020-10-24 MED ORDER — CEFAZOLIN SODIUM-DEXTROSE 2-4 GM/100ML-% IV SOLN
INTRAVENOUS | Status: AC
  Filled 2020-10-24: qty 100

## 2020-10-24 MED ORDER — SODIUM CHLORIDE 0.9 % IV SOLN
INTRAVENOUS | Status: AC
  Filled 2020-10-24: qty 2

## 2020-10-24 MED ORDER — LIDOCAINE HCL (PF) 1 % IJ SOLN
INTRAMUSCULAR | Status: AC
  Filled 2020-10-24: qty 60

## 2020-10-24 MED ORDER — FENTANYL CITRATE (PF) 100 MCG/2ML IJ SOLN
INTRAMUSCULAR | Status: AC
  Filled 2020-10-24: qty 2

## 2020-10-24 MED ORDER — ONDANSETRON HCL 4 MG/2ML IJ SOLN: 4.0000 mg | Freq: Four times a day (QID) | INTRAMUSCULAR | Status: DC | PRN

## 2020-10-24 MED ORDER — ACETAMINOPHEN 325 MG PO TABS
325.0000 mg | ORAL_TABLET | ORAL | Status: DC | PRN
  Filled 2020-10-24: qty 2

## 2020-10-24 MED ORDER — LIDOCAINE HCL (PF) 1 % IJ SOLN
INTRAMUSCULAR | Status: DC | PRN
  Administered 2020-10-24: 60 mL

## 2020-10-24 MED ORDER — CEFAZOLIN SODIUM-DEXTROSE 2-3 GM-%(50ML) IV SOLR
INTRAVENOUS | Status: AC | PRN
  Administered 2020-10-24: 2 g via INTRAVENOUS

## 2020-10-24 SURGICAL SUPPLY — 5 items
CABLE SURGICAL S-101-97-12 (CABLE) ×2 IMPLANT
IPG PACE AZUR XT SR MRI W1SR01 (Pacemaker) ×1 IMPLANT
PACE AZURE XT SR MRI W1SR01 (Pacemaker) ×2 IMPLANT
PAD PRO RADIOLUCENT 2001M-C (PAD) ×2 IMPLANT
TRAY PACEMAKER INSERTION (PACKS) ×2 IMPLANT

## 2020-10-24 NOTE — Progress Notes (Signed)
Pulse oximetry has been placed on different locations and is unable to read. Kevin Vazquez is being monitored.  Vitals are stable and patient is responding appropriately.

## 2020-10-24 NOTE — Progress Notes (Signed)
Discharge instructions reviewed with pt and his wife (via telephone) both voice understanding. [employer] called states they do not need to see the pt.

## 2020-10-24 NOTE — Discharge Instructions (Signed)

## 2020-10-24 NOTE — Interval H&P Note (Signed)
History and Physical Interval Note:  10/24/2020 9:13 AM  Kevin Vazquez  has presented today for surgery, with the diagnosis of ERI.  The various methods of treatment have been discussed with the patient and family. After consideration of risks, benefits and other options for treatment, the patient has consented to  Procedure(s): PPM GENERATOR CHANGEOUT (N/A) as a surgical intervention.  The patient's history has been reviewed, patient examined, no change in status, stable for surgery.  I have reviewed the patient's chart and labs.  Questions were answered to the patient's satisfaction.     Cristopher Peru

## 2020-10-25 ENCOUNTER — Telehealth: Payer: Self-pay

## 2020-10-25 MED FILL — Cefazolin Sodium-Dextrose IV Solution 2 GM/100ML-4%: INTRAVENOUS | Qty: 100 | Status: AC

## 2020-10-25 NOTE — Telephone Encounter (Signed)
Spoke with patient to let him know we have ordered him a new monitor and it will take a few weeks for him to get it since its on backorder. Patient states he has a monitor already but not sure. Patient will call back to let us know if there is actually a monitor in the box

## 2020-10-27 ENCOUNTER — Encounter (HOSPITAL_COMMUNITY): Payer: Self-pay | Admitting: Internal Medicine

## 2020-10-28 NOTE — Telephone Encounter (Signed)
Pt called in stated he was give a box when he left the hosp that as suppose to have a monitor in.  The nurse at the hosp handed him the box and told him to go home and all he would have to do it plug it into the wall.  When he got home and opened the box there was no monitor in the box.  He stated that he was under the assumption that someone in our office was going to be ordering him a monitor?  Pt called in to check the status?     Best number [phone]

## 2020-10-31 ENCOUNTER — Telehealth: Payer: Self-pay | Admitting: Emergency Medicine

## 2020-10-31 NOTE — Telephone Encounter (Signed)
Patient has a Medtronic pacemaker. A monitor for his pacemaker was ordered by the [facility] on 10/25/2020. They are on backorder currently.

## 2020-11-08 ENCOUNTER — Other Ambulatory Visit: Payer: Self-pay

## 2020-11-08 ENCOUNTER — Ambulatory Visit (INDEPENDENT_AMBULATORY_CARE_PROVIDER_SITE_OTHER): Payer: Medicare Other | Admitting: Emergency Medicine

## 2020-11-08 DIAGNOSIS — I495 Sick sinus syndrome: Secondary | ICD-10-CM

## 2020-11-08 LAB — CUP PACEART INCLINIC DEVICE CHECK
Battery Remaining Longevity: 187 mo
Battery Voltage: 3.23 V
Brady Statistic RV Percent Paced: 10.91 %
Date Time Interrogation Session: 20220215163215
Implantable Lead Implant Date: 20020722
Implantable Lead Location: 753860
Implantable Lead Model: 5092
Implantable Pulse Generator Implant Date: 20220131
Lead Channel Impedance Value: 494 Ohm
Lead Channel Impedance Value: 570 Ohm
Lead Channel Pacing Threshold Amplitude: 1 V
Lead Channel Pacing Threshold Pulse Width: 0.4 ms
Lead Channel Sensing Intrinsic Amplitude: 7.625 mV
Lead Channel Sensing Intrinsic Amplitude: 7.875 mV
Lead Channel Setting Pacing Amplitude: 2.5 V
Lead Channel Setting Pacing Pulse Width: 0.4 ms
Lead Channel Setting Sensing Sensitivity: 0.9 mV

## 2020-11-08 NOTE — Progress Notes (Signed)
Wound check appointment. Steri-strips removed by patient prior to appointment. Wound without redness or edema. Incision edges approximated, wound well healed. Normal device function. Thresholds, sensing, and impedances consistent with implant measurements. Device output programmed at appropriate safety margin for chronic lead. Histogram distribution appropriate for patient and level of activity. No mode switches or high ventricular rates noted. Patient educated about wound care, arm mobility, lifting restrictions. Patient is enrolled in remote monitoring, currently awaiting monitor to be shipped to home, next scheduled check 01/23/21.  ROV with Dr. Lovena Le on 01/24/21 in [place].

## 2020-11-09 ENCOUNTER — Ambulatory Visit (INDEPENDENT_AMBULATORY_CARE_PROVIDER_SITE_OTHER): Payer: Medicare Other | Admitting: *Deleted

## 2020-11-09 DIAGNOSIS — Z5181 Encounter for therapeutic drug level monitoring: Secondary | ICD-10-CM | POA: Diagnosis not present

## 2020-11-09 DIAGNOSIS — I4891 Unspecified atrial fibrillation: Secondary | ICD-10-CM

## 2020-11-09 LAB — POCT INR: INR: 2.3 (ref 2.0–3.0)

## 2020-11-09 NOTE — Patient Instructions (Signed)
Continue warfarin 1 1/2 tablets daily Recheck 4 wks after procedure

## 2020-11-21 ENCOUNTER — Other Ambulatory Visit: Payer: Self-pay | Admitting: Family Medicine

## 2020-12-06 ENCOUNTER — Encounter: Payer: Self-pay | Admitting: Physician Assistant

## 2020-12-06 ENCOUNTER — Ambulatory Visit: Payer: Medicare Other | Admitting: Physician Assistant

## 2020-12-06 ENCOUNTER — Other Ambulatory Visit: Payer: Self-pay

## 2020-12-06 DIAGNOSIS — L819 Disorder of pigmentation, unspecified: Secondary | ICD-10-CM

## 2020-12-06 DIAGNOSIS — Z1283 Encounter for screening for malignant neoplasm of skin: Secondary | ICD-10-CM

## 2020-12-06 DIAGNOSIS — D485 Neoplasm of uncertain behavior of skin: Secondary | ICD-10-CM

## 2020-12-06 DIAGNOSIS — L821 Other seborrheic keratosis: Secondary | ICD-10-CM | POA: Diagnosis not present

## 2020-12-06 DIAGNOSIS — L570 Actinic keratosis: Secondary | ICD-10-CM

## 2020-12-06 DIAGNOSIS — D1800 Hemangioma unspecified site: Secondary | ICD-10-CM

## 2020-12-06 NOTE — Patient Instructions (Signed)
[facility]  Vascular surgeon in [place], [place] Address: [address], [place], [place] [address] Hours:  Open ? Closes 5PM Confirmed by this business 11 weeks ago Phone: [phone]

## 2020-12-07 ENCOUNTER — Ambulatory Visit (INDEPENDENT_AMBULATORY_CARE_PROVIDER_SITE_OTHER): Payer: Medicare Other | Admitting: *Deleted

## 2020-12-07 DIAGNOSIS — I4891 Unspecified atrial fibrillation: Secondary | ICD-10-CM

## 2020-12-07 DIAGNOSIS — Z5181 Encounter for therapeutic drug level monitoring: Secondary | ICD-10-CM | POA: Diagnosis not present

## 2020-12-07 LAB — POCT INR: INR: 2.2 (ref 2.0–3.0)

## 2020-12-07 NOTE — Patient Instructions (Signed)
Continue warfarin 1 1/2 tablets daily Recheck in 6 wks

## 2020-12-08 ENCOUNTER — Telehealth: Payer: Self-pay | Admitting: Physician Assistant

## 2020-12-08 MED ORDER — TRIAMCINOLONE ACETONIDE 0.1 % EX CREA: TOPICAL_CREAM | CUTANEOUS | 2 refills | Status: DC

## 2020-12-08 NOTE — Telephone Encounter (Signed)
Phone call to patient to inform him that the Triamcinolone was sent in to his Pharmacy.  Patient aware

## 2020-12-08 NOTE — Telephone Encounter (Signed)
[facility] was supposed to write Rx for Triamcinolone 0.1% cream to [facility]

## 2020-12-12 ENCOUNTER — Encounter: Payer: Self-pay | Admitting: Physician Assistant

## 2020-12-12 NOTE — Progress Notes (Signed)
   Follow-Up Visit   Subjective  Kevin Vazquez is a 75 y.o. male who presents for the following: Follow-up (Patient here today for 6 month follow up.  Per patient he has a spot that has turned dark on his left shoulder x unsure, no bleeding.).   The following portions of the chart were reviewed this encounter and updated as appropriate:      Objective  Well appearing patient in no apparent distress; mood and affect are within normal limits.  All skin waist up examined.  Objective  Right Breast: Full body skin examination  Objective  Left Anterior Neck: Dark macule     Objective  Left Posterior Neck: Dark macule     Objective  Left Breast: Multiple red raised papule  Left Zygomatic Area; Right Malar Cheek  Images      Objective  Left Mid Helix, Left Tragus, Left Zygomatic Area (4), Right Mid Helix, Right Zygomatic Area (10): Erythematous patches with gritty scale.  Objective  Mid Back: Multiple Stuck-on, waxy, tan-brown papules and plaques. --Discussed benign etiology and prognosis.    Assessment & Plan  Encounter for screening for malignant neoplasm of skin Right Breast  Yearly skin check  Neoplasm of uncertain behavior of skin (2) Left Anterior Neck  Skin / nail biopsy Type of biopsy: tangential   Informed consent: discussed and consent obtained   Timeout: patient name, date of birth, surgical site, and procedure verified   Procedure prep:  Patient was prepped and draped in usual sterile fashion (Non sterile) Prep type:  Chlorhexidine Anesthesia: the lesion was anesthetized in a standard fashion   Anesthetic:  1% lidocaine w/ epinephrine 1-100,000 local infiltration Instrument used: flexible razor blade   Outcome: patient tolerated procedure well   Post-procedure details: wound care instructions given    Specimen 1 - Surgical pathology Differential Diagnosis: r/o atypia  Check Margins: No  Left Posterior Neck  Skin / nail  biopsy Type of biopsy: tangential   Informed consent: discussed and consent obtained   Timeout: patient name, date of birth, surgical site, and procedure verified   Procedure prep:  Patient was prepped and draped in usual sterile fashion (Non sterile) Prep type:  Chlorhexidine Anesthesia: the lesion was anesthetized in a standard fashion   Anesthetic:  1% lidocaine w/ epinephrine 1-100,000 local infiltration Instrument used: flexible razor blade   Outcome: patient tolerated procedure well   Post-procedure details: wound care instructions given    Specimen 2 - Surgical pathology Differential Diagnosis:r/o atypia  Check Margins: No  Hemangioma, unspecified site Left Breast  Okay to leave if stable  Dyschromia (2) Left Zygomatic Area; Right Malar Cheek  Discontinue Minocycline  AK (actinic keratosis) (17) Left Mid Helix; Right Mid Helix; Left Zygomatic Area (4); Right Zygomatic Area (10); Left Tragus  Destruction of lesion - Left Mid Helix, Left Tragus, Left Zygomatic Area, Right Mid Helix, Right Zygomatic Area Complexity: simple   Destruction method: cryotherapy   Informed consent: discussed and consent obtained   Timeout:  patient name, date of birth, surgical site, and procedure verified Lesion destroyed using liquid nitrogen: Yes   Cryotherapy cycles:  3 Outcome: patient tolerated procedure well with no complications    Seborrheic keratosis Mid Back  Okay to leave if stable    I, Jackelynn Hosie, PA-C, have reviewed all documentation's for this visit.  The documentation on 12/12/20 for the exam, diagnosis, procedures and orders are all accurate and complete.

## 2020-12-14 DIAGNOSIS — I8312 Varicose veins of left lower extremity with inflammation: Secondary | ICD-10-CM | POA: Diagnosis not present

## 2020-12-14 DIAGNOSIS — I8311 Varicose veins of right lower extremity with inflammation: Secondary | ICD-10-CM | POA: Diagnosis not present

## 2020-12-19 NOTE — Telephone Encounter (Signed)
Spoke with [employer] patient wont receive the monitor for some time due to backorder and patient does not want to be monitored from home and is okay with coming into the office like hes been coming every 6 months. Patient states he is fine and hasnt had any issues or a home monitor since he has had this PPM and he states whenever the monitor does come in then he will think about if he wants to set it up or not.

## 2020-12-21 DIAGNOSIS — I8312 Varicose veins of left lower extremity with inflammation: Secondary | ICD-10-CM | POA: Diagnosis not present

## 2020-12-21 DIAGNOSIS — I8311 Varicose veins of right lower extremity with inflammation: Secondary | ICD-10-CM | POA: Diagnosis not present

## 2020-12-29 DIAGNOSIS — I8312 Varicose veins of left lower extremity with inflammation: Secondary | ICD-10-CM | POA: Diagnosis not present

## 2020-12-29 DIAGNOSIS — I8311 Varicose veins of right lower extremity with inflammation: Secondary | ICD-10-CM | POA: Diagnosis not present

## 2021-01-23 ENCOUNTER — Ambulatory Visit (INDEPENDENT_AMBULATORY_CARE_PROVIDER_SITE_OTHER): Payer: Medicare Other | Admitting: *Deleted

## 2021-01-23 ENCOUNTER — Other Ambulatory Visit: Payer: Self-pay

## 2021-01-23 DIAGNOSIS — Z5181 Encounter for therapeutic drug level monitoring: Secondary | ICD-10-CM | POA: Diagnosis not present

## 2021-01-23 DIAGNOSIS — I4891 Unspecified atrial fibrillation: Secondary | ICD-10-CM

## 2021-01-23 LAB — POCT INR: INR: 2.4 (ref 2.0–3.0)

## 2021-01-23 NOTE — Patient Instructions (Signed)
Continue warfarin 1 1/2 tablets daily Recheck in 6 wks

## 2021-01-24 ENCOUNTER — Telehealth: Payer: Self-pay

## 2021-01-24 ENCOUNTER — Ambulatory Visit (INDEPENDENT_AMBULATORY_CARE_PROVIDER_SITE_OTHER): Payer: Medicare Other | Admitting: Internal Medicine

## 2021-01-24 ENCOUNTER — Encounter: Payer: Self-pay | Admitting: Internal Medicine

## 2021-01-24 ENCOUNTER — Other Ambulatory Visit: Payer: Self-pay | Admitting: Family Medicine

## 2021-01-24 VITALS — BP 104/58 | HR 106 | Ht 70.0 in | Wt 240.0 lb

## 2021-01-24 DIAGNOSIS — I4821 Permanent atrial fibrillation: Secondary | ICD-10-CM | POA: Diagnosis not present

## 2021-01-24 DIAGNOSIS — Z950 Presence of cardiac pacemaker: Secondary | ICD-10-CM | POA: Diagnosis not present

## 2021-01-24 MED ORDER — METOPROLOL SUCCINATE ER 25 MG PO TB24: 25.0000 mg | ORAL_TABLET | Freq: Every day | ORAL | 3 refills | Status: DC

## 2021-01-24 NOTE — Telephone Encounter (Signed)
Patient seen today in Dr. Tanna Furry clinic in [place] and needs a remote monitor. Was on back order. Per Dr. Lovena Le, he would like for patient to have remote monitoring. Patient agreeable to plan. Spoke to [facility] and states the monitors were still on back order.

## 2021-01-24 NOTE — Patient Instructions (Signed)
Medication Instructions:  Your physician has recommended you make the following change in your medication:   Increase Toprol XL to 25 mg Daily   *If you need a refill on your cardiac medications before your next appointment, please call your pharmacy*   Lab Work: NONE   If you have labs (blood work) drawn today and your tests are completely normal, you will receive your results only by: Marland Kitchen MyChart Message (if you have MyChart) OR . A paper copy in the mail If you have any lab test that is abnormal or we need to change your treatment, we will call you to review the results.   Testing/Procedures: NONE    Follow-Up: At [facility], you and your health needs are our priority.  As part of our continuing mission to provide you with exceptional heart care, we have created designated Provider Care Teams.  These Care Teams include your primary Cardiologist (physician) and Advanced Practice Providers (APPs -  Physician Assistants and Nurse Practitioners) who all work together to provide you with the care you need, when you need it.  We recommend signing up for the patient portal called "MyChart".  Sign up information is provided on this After Visit Summary.  MyChart is used to connect with patients for Virtual Visits (Telemedicine).  Patients are able to view lab/test results, encounter notes, upcoming appointments, etc.  Non-urgent messages can be sent to your provider as well.   To learn more about what you can do with MyChart, go to [email or web address].    Your next appointment:   1 year(s)  The format for your next appointment:   In Person  Provider:   Cristopher Peru, MD   Other Instructions Thank you for choosing [facility]!

## 2021-01-24 NOTE — Progress Notes (Signed)
HPI Kevin Vazquez returns today for ongoing evaluation of chronic atrial fibrillation, complete heart block, status post pacemaker insertion, and hypertension. The patient has done well in the interim.He notes that when he is doing something strenuous, his heart will speed up and he will feel the palpitations.However, this has improved some since his toprol was started.He has not had syncope. He denies chest pain. He has class Kevin Vazquez. No peripheral edema. He remains active maintaining rental houses. He reached ERI and underwent PPM gen change out 4 months ago.  No Known Allergies   Current Outpatient Medications  Medication Sig Dispense Refill  . CRANBERRY PO Take 1 tablet by mouth daily.    . Cyanocobalamin (VITAMIN B-12 PO) Take 1 tablet by mouth daily.    Marland Kitchen diltiazem (CARDIZEM) 30 MG tablet TAKE 1 TABLET BY MOUTH  DAILY AND PER . Kevin Vazquez MAY  TAKE 1 ADDITIONAL TABLET  DAILY AS NEEDED 180 tablet 3  . metoprolol succinate (TOPROL-XL) 25 MG 24 hr tablet TAKE ONE-HALF TABLET BY  MOUTH DAILY (Patient taking differently: Take 12.5 mg by mouth daily.) 45 tablet 3  . naproxen sodium (ALEVE) 220 MG tablet Take 220 mg by mouth daily as needed (headaches.).    Marland Kitchen psyllium (EQ FIBER THERAPY) 0.52 g capsule Take 0.52 g by mouth daily.    Marland Kitchen triamcinolone (KENALOG) 0.1 % Apply to affected area qd-bid 454 g 2  . warfarin (COUMADIN) 5 MG tablet TAKE 1 AND 1/2 TO 2 TABLETS BY MOUTH DAILY OR AS  DIRECTED (Patient taking differently: Take 7.5 mg by mouth daily.) 180 tablet 3   No current facility-administered medications for this visit.     Past Medical History:  Diagnosis Date  . Atrial fibrillation, permanent ([facility]azquez)   . Basal cell carcinoma 04/02/2017   Left upper lip - CX3 + cautery + 5FU  . BPH (benign prostatic hyperplasia)   . Melanoma ([place]) 08/31/2004   Level III - upper right back shoulder (Dr. Clovis Riley)  . Presence of permanent cardiac pacemaker   . Sick sinus syndrome (Kevin Vazquez)   .  Squamous cell carcinoma of skin 04/02/2017   Left temple - CX3 + cautery + 5FU  . Squamous cell carcinoma of skin 04/11/2018   Left forearm - CX3 + cautery + 5FU  . Squamous cell carcinoma of skin 07/19/20019   Left forehead - CX3 + cautery + 5FU  . Squamous cell carcinoma of skin 09/16/2019   Left inner arm - tx p bx    ROS:   All systems reviewed and negative except as noted in the HPI.   Past Surgical History:  Procedure Laterality Date  . CARDIAC CATHETERIZATION  01/26/1997   Normal LV function. Normal coronary arteries.  Marland Kitchen CARDIOVASCULAR STRESS TEST  05/02/2011   Normal perfusion scan demonstrating diaphragmatic artifact. No significant ischemia demonstrated   . LEFT LOWER EXTREMITY VENOUS DOPPLER  02/07/2009   3.8x1.3x2.8cm hypoechoic area in lateral L thigh. No evidence of DVT.  Marland Kitchen PACEMAKER INSERTION  04/24/2010   Medtronic Adapta, model #ADSRO1, serial [id number]  . PPM GENERATOR CHANGEOUT N/A 10/24/2020   Procedure: PPM GENERATOR CHANGEOUT;  Surgeon: Kevin Lance, MD;  Location: [facility];  Service: Cardiovascular;  Laterality: N/A;  . TRANSTHORACIC ECHOCARDIOGRAM  11/26/2012   EF 50-55%, LA-appendage was moderately dilated     Family History  Problem Relation Age of Onset  . Heart disease Other      Social History   Socioeconomic History  .  Marital status: Married    Spouse name: Not on file  . Number of children: Not on file  . Years of education: Not on file  . Highest education level: Not on file  Occupational History  . Occupation: Dealer Retired  Tobacco Use  . Smoking status: Never Smoker  . Smokeless tobacco: Former Systems developer    Types: Secondary school teacher  . Vaping Use: Never used  Substance and Sexual Activity  . Alcohol use: Yes    Alcohol/week: 7.0 standard drinks    Types: 7 Standard drinks or equivalent per week  . Drug use: No  . Sexual activity: Not on file  Other Topics Concern  . Not on file  Social History Narrative  . Not on file    Social Determinants of Health   Financial Resource Strain: Not on file  Food Insecurity: Not on file  Transportation Needs: Not on file  Physical Activity: Not on file  Stress: Not on file  Social Connections: Not on file  Intimate Partner Violence: Not on file     BP (!) 104/58   Pulse (!) 106   Ht 5\' 10"  (1.778 m)   Wt 240 lb (108.9 kg)   SpO2 97%   BMI 34.44 kg/m   Physical Exam:  Well appearing NAD HEENT: Unremarkable Neck:  No JVD, no thyromegally Lymphatics:  No adenopathy Back:  No CVA tenderness Lungs:  Clear HEART:  Regular rate rhythm, no murmurs, no rubs, no clicks Abd:  soft, positive bowel sounds, no organomegally, no rebound, no guarding Ext:  2 plus pulses, no edema, no cyanosis, no clubbing Skin:  No rashes no nodules Neuro:  CN II through XII intact, motor grossly intact   DEVICE  Normal device function.  See PaceArt for details.   Assess/Plan: 1. Atrial fib - his rates are not well controlled. I asked him to uptitrate his toprol. 2. Coags - he will continue his warfarin 3. PPM - his medtronic device is working normally. We will recheck remotely in several months. 4. Obesity - he is encouraged to lose weight.   Kevin Overlie [name],MD

## 2021-02-17 ENCOUNTER — Ambulatory Visit (INDEPENDENT_AMBULATORY_CARE_PROVIDER_SITE_OTHER): Payer: Medicare Other | Admitting: Family Medicine

## 2021-02-17 ENCOUNTER — Encounter: Payer: Self-pay | Admitting: Family Medicine

## 2021-02-17 ENCOUNTER — Other Ambulatory Visit: Payer: Self-pay

## 2021-02-17 VITALS — BP 132/64 | HR 92 | Temp 98.7°F | Resp 16 | Ht 70.0 in | Wt 244.0 lb

## 2021-02-17 DIAGNOSIS — T162XXA Foreign body in left ear, initial encounter: Secondary | ICD-10-CM | POA: Diagnosis not present

## 2021-02-17 DIAGNOSIS — I4891 Unspecified atrial fibrillation: Secondary | ICD-10-CM | POA: Diagnosis not present

## 2021-02-17 DIAGNOSIS — Z0001 Encounter for general adult medical examination with abnormal findings: Secondary | ICD-10-CM

## 2021-02-17 DIAGNOSIS — Z125 Encounter for screening for malignant neoplasm of prostate: Secondary | ICD-10-CM | POA: Diagnosis not present

## 2021-02-17 DIAGNOSIS — Z0000 Encounter for general adult medical examination without abnormal findings: Secondary | ICD-10-CM

## 2021-02-17 NOTE — Progress Notes (Signed)
Subjective:    Patient ID: Kevin Vazquez, male    DOB: [birth year], 75 y.o.   MRN: [id number]  HPI Patient is here today for complete physical exam. His last colonoscopy was in 2016.  Colonoscopy revealed 3 tubular adenomas.  They recommended a repeat colonoscopy in 2019.  Due to COVID, this has not happened.  I discussed this with the patient today.  He is overdue for a colonoscopy.  However he declines to allow me to schedule this at the present time.  He states that he will let me know if he changes his mind.  He is due for prostate cancer screening.  He is also due for a booster on Pneumovax, the fourth COVID-vaccine, and Shingrix.  We discussed all of these vaccinations and the patient declines them today.  He is not fasting so we cannot check his cholesterol but he would like to check a PSA and his other lab work.  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  On visual inspection, he has a cotton swab wedged deep in his auditory canal adherent to his tympanic membrane.  I was able to remove part of the cotton swab with a pair of alligator forceps.  The other part I was unable to successfully retrieve so we will attempt to remove it with irrigation and lavage Immunization History  Administered Date(s) Administered  . Influenza,inj,Quad PF,6+ Mos 07/04/2015, 06/18/2016, 06/21/2017  . Influenza-Unspecified 09/24/2018  . PFIZER(Purple Top)SARS-COV-2 Vaccination 10/30/2019, 11/24/2019  . Pneumococcal Conjugate-13 06/18/2016  . Pneumococcal Polysaccharide-23 05/25/2010  . Tdap 07/17/2012  . Zoster, Live 10/16/2012   Anti-coag visit on 01/23/2021  Component Date Value Ref Range Status  . INR 01/23/2021 2.4  2.0 - 3.0 Final   Past Medical History:  Diagnosis Date  . Atrial fibrillation, permanent ([place])   . Basal cell carcinoma 04/02/2017   Left upper lip - CX3 + cautery + 5FU  . BPH (benign prostatic hyperplasia)   . Melanoma ([place]) 08/31/2004   Level III - upper right back shoulder (Dr.  Clovis Riley)  . Presence of permanent cardiac pacemaker   . Sick sinus syndrome ([place])   . Squamous cell carcinoma of skin 04/02/2017   Left temple - CX3 + cautery + 5FU  . Squamous cell carcinoma of skin 04/11/2018   Left forearm - CX3 + cautery + 5FU  . Squamous cell carcinoma of skin 07/19/20019   Left forehead - CX3 + cautery + 5FU  . Squamous cell carcinoma of skin 09/16/2019   Left inner arm - tx p bx   Past Surgical History:  Procedure Laterality Date  . CARDIAC CATHETERIZATION  01/26/1997   Normal LV function. Normal coronary arteries.  Marland Kitchen CARDIOVASCULAR STRESS TEST  05/02/2011   Normal perfusion scan demonstrating diaphragmatic artifact. No significant ischemia demonstrated   . LEFT LOWER EXTREMITY VENOUS DOPPLER  02/07/2009   3.8x1.3x2.8cm hypoechoic area in lateral L thigh. No evidence of DVT.  Marland Kitchen PACEMAKER INSERTION  04/24/2010   Medtronic Adapta, model #ADSRO1, serial [id number]  . PPM GENERATOR CHANGEOUT N/A 10/24/2020   Procedure: PPM GENERATOR CHANGEOUT;  Surgeon: Evans Lance, MD;  Location: [facility];  Service: Cardiovascular;  Laterality: N/A;  . TRANSTHORACIC ECHOCARDIOGRAM  11/26/2012   EF 50-55%, LA-appendage was moderately dilated   Current Outpatient Medications on File Prior to Visit  Medication Sig Dispense Refill  . CRANBERRY PO Take 1 tablet by mouth daily.    . Cyanocobalamin (VITAMIN B-12 PO) Take 1 tablet  by mouth daily.    Marland Kitchen diltiazem (CARDIZEM) 30 MG tablet TAKE 1 TABLET BY MOUTH  DAILY AND PER . [place] MAY  TAKE 1 ADDITIONAL TABLET  DAILY AS NEEDED 180 tablet 3  . metoprolol succinate (TOPROL-XL) 25 MG 24 hr tablet TAKE ONE-HALF TABLET BY  MOUTH DAILY 45 tablet 3  . naproxen sodium (ALEVE) 220 MG tablet Take 220 mg by mouth daily as needed (headaches.).    Marland Kitchen psyllium (EQ FIBER THERAPY) 0.52 g capsule Take 0.52 g by mouth daily.    Marland Kitchen triamcinolone (KENALOG) 0.1 % Apply to affected area qd-bid 454 g 2  . warfarin (COUMADIN) 5 MG tablet TAKE 1 AND 1/2 TO 2  TABLETS BY MOUTH DAILY OR AS  DIRECTED 180 tablet 3   No current facility-administered medications on file prior to visit.   No Known Allergies Social History   Socioeconomic History  . Marital status: Married    Spouse name: Not on file  . Number of children: Not on file  . Years of education: Not on file  . Highest education level: Not on file  Occupational History  . Occupation: Dealer Retired  Tobacco Use  . Smoking status: Never Smoker  . Smokeless tobacco: Former Systems developer    Types: Secondary school teacher  . Vaping Use: Never used  Substance and Sexual Activity  . Alcohol use: Yes    Alcohol/week: 7.0 standard drinks    Types: 7 Standard drinks or equivalent per week  . Drug use: No  . Sexual activity: Not on file  Other Topics Concern  . Not on file  Social History Narrative  . Not on file   Social Determinants of Health   Financial Resource Strain: Not on file  Food Insecurity: Not on file  Transportation Needs: Not on file  Physical Activity: Not on file  Stress: Not on file  Social Connections: Not on file  Intimate Partner Violence: Not on file   Family History  Problem Relation Age of Onset  . Heart disease Other       Review of Systems  All other systems reviewed and are negative.      Objective:   Physical Exam Vitals reviewed.  Constitutional:      General: He is not in acute distress.    Appearance: Normal appearance. He is well-developed. He is not ill-appearing, toxic-appearing or diaphoretic.  HENT:     Head: Normocephalic and atraumatic.     Right Ear: Tympanic membrane, ear canal and external ear normal.     Left Ear: External ear normal. A foreign body is present.     Nose: Nose normal. No congestion or rhinorrhea.     Mouth/Throat:     Pharynx: No oropharyngeal exudate.  Eyes:     General: No scleral icterus.       Right eye: No discharge.        Left eye: No discharge.     Conjunctiva/sclera: Conjunctivae normal.     Pupils:  Pupils are equal, round, and reactive to light.  Neck:     Thyroid: No thyromegaly.     Vascular: No carotid bruit or JVD.     Trachea: No tracheal deviation.  Cardiovascular:     Rate and Rhythm: Normal rate. Rhythm irregular.     Heart sounds: Normal heart sounds. No murmur heard. No friction rub. No gallop.   Pulmonary:     Effort: Pulmonary effort is normal. No respiratory distress.     Breath  sounds: Normal breath sounds. No stridor. No wheezing or rales.  Chest:     Chest wall: No tenderness.  Abdominal:     General: Bowel sounds are normal. There is no distension.     Palpations: Abdomen is soft. There is no mass.     Tenderness: There is no abdominal tenderness. There is no guarding or rebound.  Musculoskeletal:        General: No tenderness or deformity. Normal range of motion.     Cervical back: Normal range of motion and neck supple.     Right lower leg: No edema.     Left lower leg: No edema.  Lymphadenopathy:     Cervical: No cervical adenopathy.  Skin:    General: Skin is warm.     Coloration: Skin is not jaundiced or pale.     Findings: No bruising, erythema, lesion or rash.  Neurological:     Mental Status: He is alert and oriented to person, place, and time.     Cranial Nerves: No cranial nerve deficit.     Motor: No abnormal muscle tone.     Coordination: Coordination normal.     Deep Tendon Reflexes: Reflexes are normal and symmetric.  Psychiatric:        Behavior: Behavior normal.        Thought Content: Thought content normal.        Judgment: Judgment normal.           Assessment & Plan:  Atrial fibrillation, unspecified type ([place]) - Plan: CBC with Differential/Platelet, COMPLETE METABOLIC PANEL WITH GFR  Prostate cancer screening - Plan: PSA  Foreign body in auricle of left ear, initial encounter  General medical exam  Patient has a resting tachycardia today.  We discussed increasing Toprol to 50.  He states that Dr. Lovena Le has been  attempting to increase his Toprol as well however he prefers not to make any changes in his Toprol dose at the present time.  I will screen for prostate cancer with a PSA and check CBC and CMP while the patient is here.  I was able to remove a large portion of the cotton swab with a pair of alligator forceps and was able to remove the remainder of the obstruction with irrigation and lavage.  The remainder of his exam is normal.  I strongly recommended a colonoscopy but he defers at the present time.  Also recommended Pneumovax 23, Shingrix, and the fourth COVID-vaccine but he politely defers at the present time

## 2021-02-18 LAB — COMPLETE METABOLIC PANEL WITH GFR
AG Ratio: 2 (calc) (ref 1.0–2.5)
ALT: 13 U/L (ref 9–46)
AST: 18 U/L (ref 10–35)
Albumin: 4.1 g/dL (ref 3.6–5.1)
Alkaline phosphatase (APISO): 57 U/L (ref 35–144)
BUN: 16 mg/dL (ref 7–25)
CO2: 26 mmol/L (ref 20–32)
Calcium: 9 mg/dL (ref 8.6–10.3)
Chloride: 104 mmol/L (ref 98–110)
Creat: 1.17 mg/dL (ref 0.70–1.18)
GFR, Est African American: 70 mL/min/{1.73_m2} (ref >=60)
GFR, Est Non African American: 61 mL/min/{1.73_m2} (ref >=60)
Globulin: 2.1 g/dL (calc) (ref 1.9–3.7)
Glucose, Bld: 95 mg/dL (ref 65–99)
Potassium: 4.5 mmol/L (ref 3.5–5.3)
Sodium: 141 mmol/L (ref 135–146)
Total Bilirubin: 0.5 mg/dL (ref 0.2–1.2)
Total Protein: 6.2 g/dL (ref 6.1–8.1)

## 2021-02-18 LAB — CBC WITH DIFFERENTIAL/PLATELET
Absolute Monocytes: 389 {cells}/uL (ref 200–950)
Basophils Absolute: 30 {cells}/uL (ref 0–200)
Basophils Relative: 0.5 %
Eosinophils Absolute: 218 {cells}/uL (ref 15–500)
Eosinophils Relative: 3.7 %
HCT: 43.3 % (ref 38.5–50.0)
Hemoglobin: 14.8 g/dL (ref 13.2–17.1)
Lymphs Abs: 1210 {cells}/uL (ref 850–3900)
MCH: 30.9 pg (ref 27.0–33.0)
MCHC: 34.2 g/dL (ref 32.0–36.0)
MCV: 90.4 fL (ref 80.0–100.0)
MPV: 10.8 fL (ref 7.5–12.5)
Monocytes Relative: 6.6 %
Neutro Abs: 4053 {cells}/uL (ref 1500–7800)
Neutrophils Relative %: 68.7 %
Platelets: 201 10*3/uL (ref 140–400)
RBC: 4.79 Million/uL (ref 4.20–5.80)
RDW: 13.9 % (ref 11.0–15.0)
Total Lymphocyte: 20.5 %
WBC: 5.9 10*3/uL (ref 3.8–10.8)

## 2021-02-18 LAB — PSA: PSA: 2.08 ng/mL (ref <=4.00)

## 2021-02-21 ENCOUNTER — Encounter: Payer: Self-pay | Admitting: *Deleted

## 2021-02-22 NOTE — Telephone Encounter (Signed)
Monitor ordered 10-25-2020. In fulfillment per [facility].

## 2021-03-06 ENCOUNTER — Ambulatory Visit (INDEPENDENT_AMBULATORY_CARE_PROVIDER_SITE_OTHER): Payer: Medicare Other | Admitting: *Deleted

## 2021-03-06 DIAGNOSIS — I4891 Unspecified atrial fibrillation: Secondary | ICD-10-CM | POA: Diagnosis not present

## 2021-03-06 DIAGNOSIS — Z5181 Encounter for therapeutic drug level monitoring: Secondary | ICD-10-CM

## 2021-03-06 LAB — POCT INR: INR: 3 (ref 2.0–3.0)

## 2021-03-06 NOTE — Patient Instructions (Signed)
Continue warfarin 1 1/2 tablets daily Recheck in 6 wks

## 2021-03-21 NOTE — Telephone Encounter (Signed)
I spoke with [employer] and the relay monitors are still on back order from November. They do not have a time frame for when the patient will have the monitor as of today.

## 2021-04-11 NOTE — Telephone Encounter (Signed)
I ordered the patient a mobile monitor with [facility].

## 2021-04-17 ENCOUNTER — Ambulatory Visit (INDEPENDENT_AMBULATORY_CARE_PROVIDER_SITE_OTHER): Payer: Medicare Other | Admitting: *Deleted

## 2021-04-17 ENCOUNTER — Other Ambulatory Visit: Payer: Self-pay

## 2021-04-17 DIAGNOSIS — I4891 Unspecified atrial fibrillation: Secondary | ICD-10-CM

## 2021-04-17 DIAGNOSIS — Z5181 Encounter for therapeutic drug level monitoring: Secondary | ICD-10-CM | POA: Diagnosis not present

## 2021-04-17 LAB — POCT INR: INR: 2.9 (ref 2.0–3.0)

## 2021-04-17 NOTE — Patient Instructions (Signed)
Continue warfarin 1 1/2 tablets daily Recheck in 6 wks

## 2021-04-24 ENCOUNTER — Telehealth: Payer: Self-pay

## 2021-04-24 ENCOUNTER — Ambulatory Visit: Payer: Medicare Other

## 2021-04-24 LAB — CUP PACEART REMOTE DEVICE CHECK
Battery Remaining Longevity: 181 mo
Battery Voltage: 3.19 V
Brady Statistic RV Percent Paced: 14.73 %
Date Time Interrogation Session: 20220801205204
Implantable Lead Implant Date: 20020722
Implantable Lead Location: 753860
Implantable Lead Model: 5092
Implantable Pulse Generator Implant Date: 20220131
Lead Channel Impedance Value: 494 Ohm
Lead Channel Impedance Value: 570 Ohm
Lead Channel Pacing Threshold Amplitude: 0.875 V
Lead Channel Pacing Threshold Pulse Width: 0.4 ms
Lead Channel Sensing Intrinsic Amplitude: 9.75 mV
Lead Channel Sensing Intrinsic Amplitude: 9.75 mV
Lead Channel Setting Pacing Amplitude: 2.5 V
Lead Channel Setting Pacing Pulse Width: 0.4 ms
Lead Channel Setting Sensing Sensitivity: 0.9 mV

## 2021-04-24 NOTE — Telephone Encounter (Signed)
I called the patient on conference with [employer] tech support. [employer] was unsuccessful.   I called the patient back with an appointment with Dr. Lovena Le in [place] 05/16/2021. The patient states he got another box from [employer] and it has the relay monitor inside of it. I got the serial number from the patient. He asked me to call him in the morning to help him set the monitor up.

## 2021-04-24 NOTE — Telephone Encounter (Signed)
Patient declined my help and waiting on [employer] to call him to set up the monitors.

## 2021-04-24 NOTE — Telephone Encounter (Signed)
I called the patient to help him set up the phone app. The patient declined my help and states someone was supposed to call him back to help him set up the app.   The patient would like to know how much the 91 day charge will be for his remotes. I told him I can give him the pacemaker codes and he can call his insurance to see how much. He would like for billing department to give him a call back.   The pacemaker codes we uses are 93296, and 93294. If someone from billing can please give him a call I will greatly appreciate it.

## 2021-04-24 NOTE — Telephone Encounter (Signed)
I spoke with [employer] and the patient monitor is assigned to someone else. He will have to use the phone app. I had Kevin Vazquez schedule him with Dr. Lovena Le in [place]. The patient is to bring the phone monitor with him.

## 2021-04-25 NOTE — Telephone Encounter (Signed)
The patient is very upset because he is not able to use the Relay monitor. He do not want anything to do with the smart phone. He states the company did not consider elder people when they made the smart phone monitors. He states he do not want to do remote monitoring. Most of his frustrations are from he has went 7 months without having a monitor. Nobody is able to tell him how much it cost. Billing department did call him and told him without insurance the remote would be around $200 but she could not tell him how much it would be with insurance because everybody plan is different. The patient states he would like to speak with an upper up about his situation.

## 2021-05-01 NOTE — Telephone Encounter (Signed)
Called and spoke to [employer] on 8/3 and they reassured me that the patient's relay monitor was working correctly and is paired to his device. A transmission was received on 8/2 at 7:52 PM. Called and made the patient aware on 8/3 that we spoke with [employer] and they reassured [place] that his relay monitor was paired to his device and that we received a transmission. The patient was very frustrated at the fact that he had to wait 7 months without a monitor. I explained to the patient that there was a backorder on the monitors and apologized for the delay. I let him know that our device tech made multiple attempts to get him set up with the phone app and even offered for the patient to come in and get everything set up and he declined. The patient was also upset that he was told the monitor was linked with another patient and does not believe that the monitor is working correctly. I again apologized to the patient for the misinformation that we had received from [employer] and again let him know that they reassured [place] that it was linked to his device and it was working correctly. The patient stated that we were trying to cover ourselves. Made patient aware that we would follow up with him next week and that we would look into this further.  I spoke with Ronalee Belts, [employer] Rep and he was going to have someone from [employer] call the patient and explain the mishap on their end.   The below message was received:  Ronalee Belts,   I am speaking with Mr. Georgiades now. I will make sure he is taken care of before I hang up. It seems we have a couple of things in common. Both of [place] from [employer]. and [employer]. As we speak he is calming down and all is well. I have given him my full name and told him to ask for me anytime he needs assistance.   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 The patient still does not believe that his relay monitor is working appropriately. The patient states that he was given a box when he was discharged from the hospital and was told to plug in the monitor when he got home. He states that the box was empty when he got home and states that this is our fault and that we are not doing our jobs. The patient still does not understand that we partner with [employer] to monitor his device. I again apologized to the patient and let him know that I understood his frustration. Made patient aware that I will look into this further and give him another follow-up call in a couple of days. Patient appreciated that I reached out to him.

## 2021-05-16 ENCOUNTER — Encounter: Payer: Medicare Other | Admitting: Internal Medicine

## 2021-05-31 ENCOUNTER — Ambulatory Visit (INDEPENDENT_AMBULATORY_CARE_PROVIDER_SITE_OTHER): Payer: Medicare Other | Admitting: *Deleted

## 2021-05-31 DIAGNOSIS — Z5181 Encounter for therapeutic drug level monitoring: Secondary | ICD-10-CM

## 2021-05-31 DIAGNOSIS — I4891 Unspecified atrial fibrillation: Secondary | ICD-10-CM | POA: Diagnosis not present

## 2021-05-31 LAB — POCT INR: INR: 3.1 — AB (ref 2.0–3.0)

## 2021-05-31 NOTE — Patient Instructions (Signed)
Continue warfarin 1 1/2 tablets daily Eat extra greens today Recheck in 6 wks

## 2021-06-08 ENCOUNTER — Ambulatory Visit: Payer: Medicare Other | Admitting: Physician Assistant

## 2021-06-08 ENCOUNTER — Other Ambulatory Visit: Payer: Self-pay

## 2021-06-08 ENCOUNTER — Encounter: Payer: Self-pay | Admitting: Physician Assistant

## 2021-06-08 DIAGNOSIS — L821 Other seborrheic keratosis: Secondary | ICD-10-CM | POA: Diagnosis not present

## 2021-06-08 DIAGNOSIS — Z1283 Encounter for screening for malignant neoplasm of skin: Secondary | ICD-10-CM

## 2021-06-08 DIAGNOSIS — Z85828 Personal history of other malignant neoplasm of skin: Secondary | ICD-10-CM | POA: Diagnosis not present

## 2021-06-08 DIAGNOSIS — Z85820 Personal history of malignant melanoma of skin: Secondary | ICD-10-CM

## 2021-06-08 NOTE — Progress Notes (Signed)
   Follow-Up Visit   Subjective  Kevin Vazquez is a 75 y.o. male who presents for the following: Annual Exam (6 month f/u- left side x months- + itch, right temple x  months- "worries me". Personal history of melanoma & non mole skin cancers. No family history of melanoma or non mole skin cancers. ).   The following portions of the chart were reviewed this encounter and updated as appropriate:  Tobacco  Allergies  Meds  Problems  Med Hx  Surg Hx  Fam Hx      Objective  Well appearing patient in no apparent distress; mood and affect are within normal limits.  All skin waist up examined.  Right Temple Stuck-on, waxy, white plaques. --Discussed benign etiology and prognosis.    Assessment & Plan  Seborrheic keratosis Right Temple  Observe. Return to clinic if changes.    I, Catalina Salasar, PA-C, have reviewed all documentation's for this visit.  The documentation on 06/08/21 for the exam, diagnosis, procedures and orders are all accurate and complete.

## 2021-07-12 ENCOUNTER — Ambulatory Visit (INDEPENDENT_AMBULATORY_CARE_PROVIDER_SITE_OTHER): Payer: Medicare Other | Admitting: *Deleted

## 2021-07-12 DIAGNOSIS — Z5181 Encounter for therapeutic drug level monitoring: Secondary | ICD-10-CM | POA: Diagnosis not present

## 2021-07-12 DIAGNOSIS — I4891 Unspecified atrial fibrillation: Secondary | ICD-10-CM

## 2021-07-12 LAB — POCT INR: INR: 2.3 (ref 2.0–3.0)

## 2021-07-12 NOTE — Patient Instructions (Signed)
Continue warfarin 1 1/2 tablets daily Continue greens Recheck in 6 wks

## 2021-07-25 LAB — CUP PACEART REMOTE DEVICE CHECK
Battery Remaining Longevity: 177 mo
Battery Voltage: 3.17 V
Brady Statistic RV Percent Paced: 16.43 %
Date Time Interrogation Session: 20221101070018
Implantable Lead Implant Date: 20020722
Implantable Lead Location: 753860
Implantable Lead Model: 5092
Implantable Pulse Generator Implant Date: 20220131
Lead Channel Impedance Value: 475 Ohm
Lead Channel Impedance Value: 532 Ohm
Lead Channel Pacing Threshold Amplitude: 0.875 V
Lead Channel Pacing Threshold Pulse Width: 0.4 ms
Lead Channel Sensing Intrinsic Amplitude: 8.25 mV
Lead Channel Sensing Intrinsic Amplitude: 8.25 mV
Lead Channel Setting Pacing Amplitude: 2.5 V
Lead Channel Setting Pacing Pulse Width: 0.4 ms
Lead Channel Setting Sensing Sensitivity: 0.9 mV

## 2021-08-23 ENCOUNTER — Ambulatory Visit (INDEPENDENT_AMBULATORY_CARE_PROVIDER_SITE_OTHER): Payer: Medicare Other | Admitting: *Deleted

## 2021-08-23 DIAGNOSIS — I4891 Unspecified atrial fibrillation: Secondary | ICD-10-CM

## 2021-08-23 DIAGNOSIS — Z5181 Encounter for therapeutic drug level monitoring: Secondary | ICD-10-CM

## 2021-08-23 LAB — POCT INR: INR: 2.7 (ref 2.0–3.0)

## 2021-08-23 NOTE — Patient Instructions (Signed)
Continue warfarin 1 1/2 tablets daily Continue greens Recheck in 6 wks

## 2021-10-04 ENCOUNTER — Ambulatory Visit (INDEPENDENT_AMBULATORY_CARE_PROVIDER_SITE_OTHER): Payer: Medicare Other | Admitting: *Deleted

## 2021-10-04 ENCOUNTER — Other Ambulatory Visit: Payer: Self-pay

## 2021-10-04 DIAGNOSIS — I4891 Unspecified atrial fibrillation: Secondary | ICD-10-CM | POA: Diagnosis not present

## 2021-10-04 DIAGNOSIS — Z5181 Encounter for therapeutic drug level monitoring: Secondary | ICD-10-CM

## 2021-10-04 LAB — POCT INR: INR: 2.3 (ref 2.0–3.0)

## 2021-10-04 NOTE — Patient Instructions (Signed)
Continue warfarin 1 1/2 tablets daily Continue greens Recheck in 6 wks

## 2021-10-11 ENCOUNTER — Other Ambulatory Visit: Payer: Self-pay

## 2021-10-11 ENCOUNTER — Ambulatory Visit: Payer: Medicare Other | Admitting: Physician Assistant

## 2021-10-11 ENCOUNTER — Telehealth: Payer: Self-pay | Admitting: Physician Assistant

## 2021-10-11 DIAGNOSIS — L820 Inflamed seborrheic keratosis: Secondary | ICD-10-CM | POA: Diagnosis not present

## 2021-10-11 DIAGNOSIS — L309 Dermatitis, unspecified: Secondary | ICD-10-CM

## 2021-10-11 DIAGNOSIS — L570 Actinic keratosis: Secondary | ICD-10-CM | POA: Diagnosis not present

## 2021-10-11 DIAGNOSIS — Z1283 Encounter for screening for malignant neoplasm of skin: Secondary | ICD-10-CM | POA: Diagnosis not present

## 2021-10-11 DIAGNOSIS — Z85820 Personal history of malignant melanoma of skin: Secondary | ICD-10-CM

## 2021-10-11 DIAGNOSIS — D485 Neoplasm of uncertain behavior of skin: Secondary | ICD-10-CM

## 2021-10-11 MED ORDER — TRIAMCINOLONE ACETONIDE 0.1 % EX CREA: 1.0000 "application " | TOPICAL_CREAM | Freq: Every day | CUTANEOUS | 3 refills | Status: DC | PRN

## 2021-10-11 NOTE — Patient Instructions (Signed)

## 2021-10-11 NOTE — Telephone Encounter (Signed)
Say KRS this morning. Asked for Q.Wife called asked to ask about a prescription for Shiv's knees that was discussed with [facility].

## 2021-10-11 NOTE — Telephone Encounter (Signed)
Phone call to patient's wife to get the information that was discussed with Robyne Askew at the time of the visit. Information given for me to give to [facility], [place]

## 2021-10-17 ENCOUNTER — Encounter: Payer: Self-pay | Admitting: Physician Assistant

## 2021-10-17 NOTE — Progress Notes (Signed)
° °  Follow-Up Visit   Subjective  Kevin Vazquez is a 76 y.o. male who presents for the following: Annual Exam (Pt here for annual and has a spot of concern on the R temple that he would like removed. Pt has hx of MM/Pt declined gown ).   The following portions of the chart were reviewed this encounter and updated as appropriate:  Tobacco   Allergies   Meds   Problems   Med Hx   Surg Hx   Fam Hx       Objective  Well appearing patient in no apparent distress; mood and affect are within normal limits.  All skin waist up examined.  Left Forearm - Anterior (3), Left Forehead, Left Zygomatic Area, Right Buccal Cheek (3), Right Forearm - Anterior (2), Right Temporal Scalp (3), Right Zygomatic Area (4) Erythematous patches with gritty scale.  Right Temple Thick brown plaque.     Right Submandibular Area Thick skin toned crust.      Left Lower Leg - Anterior, Right Lower Leg - Anterior Thin scaly erythematous papules coalescing to plaques.    Assessment & Plan  Actinic keratosis (17) Left Forearm - Anterior (3); Right Forearm - Anterior (2); Left Zygomatic Area; Right Zygomatic Area (4); Right Buccal Cheek (3); Left Forehead; Right Temporal Scalp (3)  Destruction of lesion - Left Forearm - Anterior, Left Forehead, Left Zygomatic Area, Right Buccal Cheek, Right Forearm - Anterior, Right Temporal Scalp, Right Zygomatic Area Complexity: simple   Destruction method: cryotherapy   Informed consent: discussed and consent obtained   Timeout:  patient name, date of birth, surgical site, and procedure verified Lesion destroyed using liquid nitrogen: Yes   Cryotherapy cycles:  1 Outcome: patient tolerated procedure well with no complications   Post-procedure details: wound care instructions given    Neoplasm of uncertain behavior of skin (2) Right Temple  Skin / nail biopsy Type of biopsy: tangential   Informed consent: discussed and consent obtained   Timeout: patient name, date  of birth, surgical site, and procedure verified   Anesthesia: the lesion was anesthetized in a standard fashion   Anesthetic:  1% lidocaine w/ epinephrine 1-100,000 local infiltration Instrument used: flexible razor blade   Hemostasis achieved with: aluminum chloride and electrodesiccation   Outcome: patient tolerated procedure well   Post-procedure details: wound care instructions given    Specimen 1 - Surgical pathology Differential Diagnosis: SK  Check Margins: No  Right Submandibular Area  Skin / nail biopsy Type of biopsy: tangential   Informed consent: discussed and consent obtained   Timeout: patient name, date of birth, surgical site, and procedure verified   Anesthesia: the lesion was anesthetized in a standard fashion   Anesthetic:  1% lidocaine w/ epinephrine 1-100,000 local infiltration Instrument used: flexible razor blade   Hemostasis achieved with: aluminum chloride and electrodesiccation   Outcome: patient tolerated procedure well   Post-procedure details: wound care instructions given    Specimen 2 - Surgical pathology Differential Diagnosis: SK  Check Margins: No  Dermatitis Left Lower Leg - Anterior; Right Lower Leg - Anterior  triamcinolone cream (KENALOG) 0.1 % - Left Lower Leg - Anterior, Right Lower Leg - Anterior Apply 1 application topically daily as needed.    I, Renee Beale, PA-C, have reviewed all documentation's for this visit.  The documentation on 10/17/21 for the exam, diagnosis, procedures and orders are all accurate and complete.

## 2021-10-24 ENCOUNTER — Ambulatory Visit (INDEPENDENT_AMBULATORY_CARE_PROVIDER_SITE_OTHER): Payer: Medicare Other

## 2021-10-24 DIAGNOSIS — I4891 Unspecified atrial fibrillation: Secondary | ICD-10-CM | POA: Diagnosis not present

## 2021-10-24 LAB — CUP PACEART REMOTE DEVICE CHECK
Battery Remaining Longevity: 174 mo
Battery Voltage: 3.15 V
Brady Statistic RV Percent Paced: 15.81 %
Date Time Interrogation Session: 20230130202225
Implantable Lead Implant Date: 20020722
Implantable Lead Location: 753860
Implantable Lead Model: 5092
Implantable Pulse Generator Implant Date: 20220131
Lead Channel Impedance Value: 532 Ohm
Lead Channel Impedance Value: 589 Ohm
Lead Channel Pacing Threshold Amplitude: 0.875 V
Lead Channel Pacing Threshold Pulse Width: 0.4 ms
Lead Channel Sensing Intrinsic Amplitude: 9.5 mV
Lead Channel Sensing Intrinsic Amplitude: 9.5 mV
Lead Channel Setting Pacing Amplitude: 2.5 V
Lead Channel Setting Pacing Pulse Width: 0.4 ms
Lead Channel Setting Sensing Sensitivity: 0.9 mV

## 2021-10-25 ENCOUNTER — Other Ambulatory Visit: Payer: Self-pay | Admitting: Family Medicine

## 2021-10-26 ENCOUNTER — Ambulatory Visit (INDEPENDENT_AMBULATORY_CARE_PROVIDER_SITE_OTHER): Payer: Medicare Other | Admitting: Family Medicine

## 2021-10-26 ENCOUNTER — Encounter: Payer: Self-pay | Admitting: Family Medicine

## 2021-10-26 ENCOUNTER — Other Ambulatory Visit: Payer: Self-pay

## 2021-10-26 VITALS — BP 108/62 | HR 52 | Temp 97.0°F | Resp 18 | Ht 70.0 in | Wt 242.0 lb

## 2021-10-26 DIAGNOSIS — M25561 Pain in right knee: Secondary | ICD-10-CM

## 2021-10-26 DIAGNOSIS — G8929 Other chronic pain: Secondary | ICD-10-CM | POA: Diagnosis not present

## 2021-10-26 DIAGNOSIS — M25562 Pain in left knee: Secondary | ICD-10-CM

## 2021-10-26 MED ORDER — METOPROLOL SUCCINATE ER 25 MG PO TB24: 25.0000 mg | ORAL_TABLET | Freq: Every day | ORAL | 3 refills | Status: DC

## 2021-10-26 NOTE — Progress Notes (Signed)
Subjective:    Patient ID: Kevin Vazquez, male    DOB: 03/14/1946, 76 y.o.   MRN: [id number]  HPI Patient presents today with bilateral knee pain.  He has crepitus in his right knee.  He has pain with Apley grind in his right knee.  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  He has a history of a meniscal tear in his right knee.  He has some crepitus in his left knee and some tenderness over the joint lines bilaterally.  It hurts to stand.  Hurts to walk.  Worse after sitting for prolonged period of time.  He has not had any x-rays.  Unfortunately he is on Coumadin.  He also has a cerumen impaction in his right ear that he would like removed Past Medical History:  Diagnosis Date   Atrial fibrillation, permanent ([place])    Basal cell carcinoma 04/02/2017   Left upper lip - CX3 + cautery + 5FU   BPH (benign prostatic hyperplasia)    Melanoma ([place]) 08/31/2004   Level III - upper right back shoulder (Dr. Clovis Riley)   Presence of permanent cardiac pacemaker    Sick sinus syndrome ([place])    Squamous cell carcinoma of skin 04/02/2017   Left temple - CX3 + cautery + 5FU   Squamous cell carcinoma of skin 04/11/2018   Left forearm - CX3 + cautery + 5FU   Squamous cell carcinoma of skin 07/19/20019   Left forehead - CX3 + cautery + 5FU   Squamous cell carcinoma of skin 09/16/2019   Left inner arm - tx p bx   Past Surgical History:  Procedure Laterality Date   CARDIAC CATHETERIZATION  01/26/1997   Normal LV function. Normal coronary arteries.   CARDIOVASCULAR STRESS TEST  05/02/2011   Normal perfusion scan demonstrating diaphragmatic artifact. No significant ischemia demonstrated    LEFT LOWER EXTREMITY VENOUS DOPPLER  02/07/2009   3.8x1.3x2.8cm hypoechoic area in lateral L thigh. No evidence of DVT.   PACEMAKER INSERTION  04/24/2010   Medtronic Adapta, model #ADSRO1, serial #[id number]   PPM GENERATOR CHANGEOUT N/A 10/24/2020   Procedure: PPM GENERATOR CHANGEOUT;   Surgeon: Evans Lance, MD;  Location: [facility];  Service: Cardiovascular;  Laterality: N/A;   TRANSTHORACIC ECHOCARDIOGRAM  11/26/2012   EF 50-55%, LA-appendage was moderately dilated   Current Outpatient Medications on File Prior to Visit  Medication Sig Dispense Refill   CRANBERRY PO Take 1 tablet by mouth daily.     Cyanocobalamin (VITAMIN B-12 PO) Take 1 tablet by mouth daily.     diltiazem (CARDIZEM) 30 MG tablet TAKE 1 TABLET BY MOUTH  DAILY AND PER . TAYLOR MAY  TAKE 1 ADDITIONAL TABLET  DAILY AS NEEDED 180 tablet 3   naproxen sodium (ALEVE) 220 MG tablet Take 220 mg by mouth daily as needed (headaches.).     psyllium (EQ FIBER THERAPY) 0.52 g capsule Take 0.52 g by mouth daily.     triamcinolone (KENALOG) 0.1 % Apply to affected area qd-bid 454 g 2   triamcinolone cream (KENALOG) 0.1 % Apply 1 application topically daily as needed. 453 g 3   warfarin (COUMADIN) 5 MG tablet TAKE 1 AND 1/2 TO 2 TABLETS BY MOUTH DAILY OR AS  DIRECTED 180 tablet 3   No current facility-administered medications on file prior to visit.   No Known Allergies Social History   Socioeconomic History   Marital status: Married  Spouse name: Not on file   Number of children: Not on file   Years of education: Not on file   Highest education level: Not on file  Occupational History   Occupation: Dealer Retired  Tobacco Use   Smoking status: Never   Smokeless tobacco: Former    Types: Nurse, children's Use: Never used  Substance and Sexual Activity   Alcohol use: Yes    Alcohol/week: 7.0 standard drinks    Types: 7 Standard drinks or equivalent per week   Drug use: No   Sexual activity: Not on file  Other Topics Concern   Not on file  Social History Narrative   Not on file   Social Determinants of Health   Financial Resource Strain: Not on file  Food Insecurity: Not on file  Transportation Needs: Not on file  Physical Activity: Not on file  Stress: Not on file  Social  Connections: Not on file  Intimate Partner Violence: Not on file   Family History  Problem Relation Age of Onset   Heart disease Other       Review of Systems  All other systems reviewed and are negative.     Objective:   Physical Exam Vitals reviewed.  Constitutional:      General: He is not in acute distress.    Appearance: Normal appearance. He is well-developed. He is not ill-appearing, toxic-appearing or diaphoretic.  HENT:     Head: Normocephalic and atraumatic.     Right Ear: Tympanic membrane, ear canal and external ear normal.     Left Ear: External ear normal. A foreign body is present.     Nose: Nose normal. No congestion or rhinorrhea.     Mouth/Throat:     Pharynx: No oropharyngeal exudate.  Eyes:     General: No scleral icterus.       Right eye: No discharge.        Left eye: No discharge.     Conjunctiva/sclera: Conjunctivae normal.     Pupils: Pupils are equal, round, and reactive to light.  Neck:     Thyroid: No thyromegaly.     Vascular: No carotid bruit or JVD.     Trachea: No tracheal deviation.  Cardiovascular:     Rate and Rhythm: Normal rate. Rhythm irregular.     Heart sounds: Normal heart sounds. No murmur heard.   No friction rub. No gallop.  Pulmonary:     Effort: Pulmonary effort is normal. No respiratory distress.     Breath sounds: Normal breath sounds. No stridor. No wheezing or rales.  Chest:     Chest wall: No tenderness.  Abdominal:     General: Bowel sounds are normal. There is no distension.     Palpations: Abdomen is soft. There is no mass.     Tenderness: There is no abdominal tenderness. There is no guarding or rebound.  Musculoskeletal:        General: No tenderness or deformity.     Cervical back: Normal range of motion and neck supple.     Right knee: Bony tenderness and crepitus present. No effusion. Decreased range of motion. MCL laxity present. Abnormal meniscus.     Left knee: Bony tenderness and crepitus present. No  effusion. Decreased range of motion.     Right lower leg: No edema.     Left lower leg: No edema.  Lymphadenopathy:     Cervical: No cervical adenopathy.  Skin:  General: Skin is warm.     Coloration: Skin is not jaundiced or pale.     Findings: No bruising, erythema, lesion or rash.  Neurological:     Mental Status: He is alert and oriented to person, place, and time.     Cranial Nerves: No cranial nerve deficit.     Motor: No abnormal muscle tone.     Coordination: Coordination normal.     Deep Tendon Reflexes: Reflexes are normal and symmetric.  Psychiatric:        Behavior: Behavior normal.        Thought Content: Thought content normal.        Judgment: Judgment normal.          Assessment & Plan:  Chronic pain of both knees I suspect osteoarthritis.  Patient cannot take NSAIDs due to Coumadin.  Recommended that he try Tylenol 1000 mg twice daily for knee pain.  If this is not working, he could return for cortisone injections in both knees.  If that is not working the neck step would be to see an orthopedist.  Also offered him pain medication but he declines this at the present time.  We tried to remove the impaction with irrigation and lavage but was unsuccessful.  Recommended Debrox over-the-counter.

## 2021-11-01 NOTE — Progress Notes (Signed)
Remote pacemaker transmission.   

## 2021-11-09 ENCOUNTER — Ambulatory Visit (INDEPENDENT_AMBULATORY_CARE_PROVIDER_SITE_OTHER): Payer: Medicare Other | Admitting: Family Medicine

## 2021-11-09 ENCOUNTER — Encounter: Payer: Self-pay | Admitting: Family Medicine

## 2021-11-09 ENCOUNTER — Other Ambulatory Visit: Payer: Self-pay

## 2021-11-09 VITALS — BP 102/62 | HR 83 | Temp 97.0°F | Resp 18 | Ht 70.0 in | Wt 242.0 lb

## 2021-11-09 DIAGNOSIS — H6123 Impacted cerumen, bilateral: Secondary | ICD-10-CM

## 2021-11-09 NOTE — Progress Notes (Signed)
Subjective:    Patient ID: Kevin Vazquez, male    DOB: [birth year], 76 y.o.   MRN: [id number]  HPI patient recently fell off a ladder.  Shortly thereafter he states that he cannot hear very well.  He always has trouble with his right ear.  He states now that he cannot hear out of his left ear.  On examination today, both auditory canals are completely impacted with cerumen.  He denies any pain in his ear.  He denies any fever or chills or discharge.  He questions why the hearing loss hurt after he fell off a ladder if it is just due to a cerumen impaction. Past Medical History:  Diagnosis Date   Atrial fibrillation, permanent ([place])    Basal cell carcinoma 04/02/2017   Left upper lip - CX3 + cautery + 5FU   BPH (benign prostatic hyperplasia)    Melanoma ([place]) 08/31/2004   Level III - upper right back shoulder (Dr. Clovis Riley)   Presence of permanent cardiac pacemaker    Sick sinus syndrome ([place])    Squamous cell carcinoma of skin 04/02/2017   Left temple - CX3 + cautery + 5FU   Squamous cell carcinoma of skin 04/11/2018   Left forearm - CX3 + cautery + 5FU   Squamous cell carcinoma of skin 07/19/20019   Left forehead - CX3 + cautery + 5FU   Squamous cell carcinoma of skin 09/16/2019   Left inner arm - tx p bx   Past Surgical History:  Procedure Laterality Date   CARDIAC CATHETERIZATION  01/26/1997   Normal LV function. Normal coronary arteries.   CARDIOVASCULAR STRESS TEST  05/02/2011   Normal perfusion scan demonstrating diaphragmatic artifact. No significant ischemia demonstrated    LEFT LOWER EXTREMITY VENOUS DOPPLER  02/07/2009   3.8x1.3x2.8cm hypoechoic area in lateral L thigh. No evidence of DVT.   PACEMAKER INSERTION  04/24/2010   Medtronic Adapta, model #ADSRO1, serial #[id number]   PPM GENERATOR CHANGEOUT N/A 10/24/2020   Procedure: PPM GENERATOR CHANGEOUT;  Surgeon: Evans Lance, MD;  Location: [facility];  Service: Cardiovascular;  Laterality: N/A;   TRANSTHORACIC  ECHOCARDIOGRAM  11/26/2012   EF 50-55%, LA-appendage was moderately dilated   Current Outpatient Medications on File Prior to Visit  Medication Sig Dispense Refill   CRANBERRY PO Take 1 tablet by mouth daily.     Cyanocobalamin (VITAMIN B-12 PO) Take 1 tablet by mouth daily.     diltiazem (CARDIZEM) 30 MG tablet TAKE 1 TABLET BY MOUTH  DAILY AND PER . TAYLOR MAY  TAKE 1 ADDITIONAL TABLET  DAILY AS NEEDED 180 tablet 3   metoprolol succinate (TOPROL-XL) 25 MG 24 hr tablet Take 1 tablet (25 mg total) by mouth daily. 90 tablet 3   naproxen sodium (ALEVE) 220 MG tablet Take 220 mg by mouth daily as needed (headaches.).     psyllium (EQ FIBER THERAPY) 0.52 g capsule Take 0.52 g by mouth daily.     triamcinolone (KENALOG) 0.1 % Apply to affected area qd-bid 454 g 2   triamcinolone cream (KENALOG) 0.1 % Apply 1 application topically daily as needed. 453 g 3   warfarin (COUMADIN) 5 MG tablet TAKE 1 AND 1/2 TO 2 TABLETS BY MOUTH DAILY OR AS  DIRECTED 180 tablet 3   No current facility-administered medications on file prior to visit.   No Known Allergies Social History   Socioeconomic History   Marital status: Married    Spouse name: Not on file  Number of children: Not on file   Years of education: Not on file   Highest education level: Not on file  Occupational History   Occupation: Dealer Retired  Tobacco Use   Smoking status: Never   Smokeless tobacco: Former    Types: Nurse, children's Use: Never used  Substance and Sexual Activity   Alcohol use: Yes    Alcohol/week: 7.0 standard drinks    Types: 7 Standard drinks or equivalent per week   Drug use: No   Sexual activity: Not on file  Other Topics Concern   Not on file  Social History Narrative   Not on file   Social Determinants of Health   Financial Resource Strain: Not on file  Food Insecurity: Not on file  Transportation Needs: Not on file  Physical Activity: Not on file  Stress: Not on file  Social  Connections: Not on file  Intimate Partner Violence: Not on file   Family History  Problem Relation Age of Onset   Heart disease Other       Review of Systems  All other systems reviewed and are negative.     Objective:   Physical Exam Vitals reviewed.  Constitutional:      General: He is not in acute distress.    Appearance: Normal appearance. He is well-developed. He is not ill-appearing, toxic-appearing or diaphoretic.  HENT:     Head: Normocephalic and atraumatic.     Right Ear: External ear normal. There is impacted cerumen.     Left Ear: External ear normal. There is impacted cerumen. A foreign body is present.  Neck:     Thyroid: No thyromegaly.     Vascular: No JVD.     Trachea: No tracheal deviation.  Cardiovascular:     Rate and Rhythm: Normal rate. Rhythm irregular.     Heart sounds: Normal heart sounds. No murmur heard.   No friction rub. No gallop.  Pulmonary:     Effort: Pulmonary effort is normal. No respiratory distress.     Breath sounds: Normal breath sounds. No stridor. No wheezing or rales.  Chest:     Chest wall: No tenderness.  Musculoskeletal:     Right knee: Bony tenderness and crepitus present. No effusion. Decreased range of motion. MCL laxity present. Abnormal meniscus.     Left knee: Bony tenderness and crepitus present. No effusion. Decreased range of motion.  Skin:    Findings: No rash.  Neurological:     General: No focal deficit present.     Mental Status: He is alert and oriented to person, place, and time. Mental status is at baseline.     Cranial Nerves: No cranial nerve deficit.     Sensory: No sensory deficit.     Motor: No abnormal muscle tone.     Coordination: Coordination normal.     Deep Tendon Reflexes: Reflexes are normal and symmetric.          Assessment & Plan:  Bilateral hearing loss due to cerumen impaction I suspect that the fall from the ladder, dislodged cerumen causing complete lesion of the left auditory  canal.  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  Therefore if we can remove the wax, his hearing should improve.  We attempted to do that today with irrigation and lavage.  We were successful in removing the wax from his right ear but we could not  get the wax in his left ear divides.  Using a speculum and a curette I attempted to try to remove the wax but was causing the patient pain.  Therefore I recommended an ENT consultation to remove the wax using a vacuum.

## 2021-11-13 ENCOUNTER — Telehealth: Payer: Self-pay | Admitting: Family Medicine

## 2021-11-13 DIAGNOSIS — H6123 Impacted cerumen, bilateral: Secondary | ICD-10-CM

## 2021-11-13 NOTE — Telephone Encounter (Signed)
Spoke with patient and he is very upset that we cannot get him scheduled any sooner with Dr. Deeann Saint office. I explained to patient that we have no control over his schedule and cannot ask them to see anyone sooner than they are able to. Patient is very upset at entire referral process. Offered to send new referral to [facility] and patient wants to know if they accept his ins., advised him I would not be able to tell him that but he could call them to make sure they do. He does not understand referral process and wants [place] to call the to handle everything. I tried to explain our referral coordinator will handle most of the process but again we cannot ensure he will be able to schedule a sooner appt. Patient still not happy with process. New referral placed. Patient did not want contact info for [facility].

## 2021-11-13 NOTE — Telephone Encounter (Signed)
Patient received appt for 3/22 for ear specialist Dr. Arnette Norris. Patient wants to be seen sooner; wants to know if provider can either request a sooner appointment with Dr. Arnette Norris or ask for referral to a different provider who can see him sooner. Patient very concerned due to loss of hearing.   Please advise at [phone].

## 2021-11-15 ENCOUNTER — Ambulatory Visit (INDEPENDENT_AMBULATORY_CARE_PROVIDER_SITE_OTHER): Payer: Medicare Other | Admitting: *Deleted

## 2021-11-15 DIAGNOSIS — Z5181 Encounter for therapeutic drug level monitoring: Secondary | ICD-10-CM | POA: Diagnosis not present

## 2021-11-15 DIAGNOSIS — I4891 Unspecified atrial fibrillation: Secondary | ICD-10-CM

## 2021-11-15 LAB — POCT INR: INR: 2.3 (ref 2.0–3.0)

## 2021-11-15 NOTE — Patient Instructions (Signed)
Continue warfarin 1 1/2 tablets daily Continue greens Recheck in 6 wks

## 2021-12-13 DIAGNOSIS — H6981 Other specified disorders of Eustachian tube, right ear: Secondary | ICD-10-CM | POA: Diagnosis not present

## 2021-12-13 DIAGNOSIS — J310 Chronic rhinitis: Secondary | ICD-10-CM | POA: Diagnosis not present

## 2021-12-13 DIAGNOSIS — J338 Other polyp of sinus: Secondary | ICD-10-CM | POA: Diagnosis not present

## 2021-12-13 DIAGNOSIS — J343 Hypertrophy of nasal turbinates: Secondary | ICD-10-CM | POA: Diagnosis not present

## 2021-12-13 DIAGNOSIS — H6122 Impacted cerumen, left ear: Secondary | ICD-10-CM | POA: Diagnosis not present

## 2021-12-13 DIAGNOSIS — H903 Sensorineural hearing loss, bilateral: Secondary | ICD-10-CM | POA: Diagnosis not present

## 2021-12-19 ENCOUNTER — Emergency Department (HOSPITAL_COMMUNITY)
Admission: EM | Admit: 2021-12-19 | Discharge: 2021-12-19 | Disposition: A | Discharge status: Home / Self Care | Payer: Medicare Other | Attending: Emergency Medicine | Admitting: Emergency Medicine

## 2021-12-19 ENCOUNTER — Other Ambulatory Visit: Payer: Self-pay

## 2021-12-19 ENCOUNTER — Emergency Department (HOSPITAL_COMMUNITY): Payer: Medicare Other

## 2021-12-19 ENCOUNTER — Encounter (HOSPITAL_COMMUNITY): Payer: Self-pay | Admitting: *Deleted

## 2021-12-19 DIAGNOSIS — Z79899 Other long term (current) drug therapy: Secondary | ICD-10-CM | POA: Insufficient documentation

## 2021-12-19 DIAGNOSIS — N4289 Other specified disorders of prostate: Secondary | ICD-10-CM

## 2021-12-19 DIAGNOSIS — I4891 Unspecified atrial fibrillation: Secondary | ICD-10-CM | POA: Insufficient documentation

## 2021-12-19 DIAGNOSIS — I700 Atherosclerosis of aorta: Secondary | ICD-10-CM | POA: Diagnosis not present

## 2021-12-19 DIAGNOSIS — R1032 Left lower quadrant pain: Secondary | ICD-10-CM | POA: Diagnosis not present

## 2021-12-19 DIAGNOSIS — K5792 Diverticulitis of intestine, part unspecified, without perforation or abscess without bleeding: Secondary | ICD-10-CM | POA: Insufficient documentation

## 2021-12-19 DIAGNOSIS — Z7901 Long term (current) use of anticoagulants: Secondary | ICD-10-CM | POA: Diagnosis not present

## 2021-12-19 LAB — CBC WITH DIFFERENTIAL/PLATELET
Abs Immature Granulocytes: 0.02 10*3/uL (ref 0.00–0.07)
Basophils Absolute: 0 10*3/uL (ref 0.0–0.1)
Basophils Relative: 0 %
Eosinophils Absolute: 0.1 10*3/uL (ref 0.0–0.5)
Eosinophils Relative: 2 %
HCT: 44.3 % (ref 39.0–52.0)
Hemoglobin: 15.1 g/dL (ref 13.0–17.0)
Immature Granulocytes: 0 %
Lymphocytes Relative: 10 %
Lymphs Abs: 0.9 10*3/uL (ref 0.7–4.0)
MCH: 31.5 pg (ref 26.0–34.0)
MCHC: 34.1 g/dL (ref 30.0–36.0)
MCV: 92.3 fL (ref 80.0–100.0)
Monocytes Absolute: 0.5 10*3/uL (ref 0.1–1.0)
Monocytes Relative: 5 %
Neutro Abs: 7.7 10*3/uL (ref 1.7–7.7)
Neutrophils Relative %: 83 %
Platelets: 184 10*3/uL (ref 150–400)
RBC: 4.8 MIL/uL (ref 4.22–5.81)
RDW: 13.4 % (ref 11.5–15.5)
WBC: 9.3 10*3/uL (ref 4.0–10.5)
nRBC: 0 % (ref 0.0–0.2)

## 2021-12-19 LAB — COMPREHENSIVE METABOLIC PANEL
ALT: 24 U/L (ref 0–44)
AST: 17 U/L (ref 15–41)
Albumin: 3.9 g/dL (ref 3.5–5.0)
Alkaline Phosphatase: 53 U/L (ref 38–126)
Anion gap: 9 (ref 5–15)
BUN: 18 mg/dL (ref 8–23)
CO2: 26 mmol/L (ref 22–32)
Calcium: 8.6 mg/dL — ABNORMAL LOW (ref 8.9–10.3)
Chloride: 101 mmol/L (ref 98–111)
Creatinine, Ser: 1.27 mg/dL — ABNORMAL HIGH (ref 0.61–1.24)
GFR, Estimated: 59 mL/min — ABNORMAL LOW (ref >60)
Glucose, Bld: 135 mg/dL — ABNORMAL HIGH (ref 70–99)
Potassium: 4.1 mmol/L (ref 3.5–5.1)
Sodium: 136 mmol/L (ref 135–145)
Total Bilirubin: 0.8 mg/dL (ref 0.3–1.2)
Total Protein: 6.6 g/dL (ref 6.5–8.1)

## 2021-12-19 LAB — URINALYSIS, ROUTINE W REFLEX MICROSCOPIC
Bilirubin Urine: NEGATIVE
Glucose, UA: NEGATIVE mg/dL
Hgb urine dipstick: NEGATIVE
Ketones, ur: NEGATIVE mg/dL
Leukocytes,Ua: NEGATIVE
Nitrite: NEGATIVE
Protein, ur: NEGATIVE mg/dL
Specific Gravity, Urine: 1.019 (ref 1.005–1.030)
pH: 5 (ref 5.0–8.0)

## 2021-12-19 LAB — LIPASE, BLOOD: Lipase: 30 U/L (ref 11–51)

## 2021-12-19 LAB — PROTIME-INR
INR: 2.2 — ABNORMAL HIGH (ref 0.8–1.2)
Prothrombin Time: 24 seconds — ABNORMAL HIGH (ref 11.4–15.2)

## 2021-12-19 MED ORDER — IOHEXOL 300 MG/ML  SOLN
100.0000 mL | Freq: Once | INTRAMUSCULAR | Status: AC | PRN
  Administered 2021-12-19: 100 mL via INTRAVENOUS

## 2021-12-19 MED ORDER — AMOXICILLIN-POT CLAVULANATE 875-125 MG PO TABS: 1.0000 | ORAL_TABLET | Freq: Three times a day (TID) | ORAL | 0 refills | Status: AC

## 2021-12-19 MED ORDER — AMOXICILLIN-POT CLAVULANATE 875-125 MG PO TABS
1.0000 | ORAL_TABLET | Freq: Once | ORAL | Status: AC
  Administered 2021-12-19: 1 via ORAL
  Filled 2021-12-19: qty 1

## 2021-12-19 NOTE — ED Provider Notes (Signed)
?[facility] ?Provider Note ? ? ?CSN: [id number] ?Arrival date & time: 12/19/21  1306 ? ?  ? ?History ? ?Chief Complaint  ?Patient presents with  ? Abdominal Pain  ? ? ?Kevin Vazquez is a 76 y.o. male. ? ?HPI ?Patient is a 76 year old male with a history of atrial fibrillation anticoagulated on Coumadin, hyperlipidemia, sick sinus syndrome, who presents to the emergency department due to left lower quadrant abdominal pain.  States it started yesterday.  It was initially worse with movement.  States became more constant today and worsened in severity.  Notes that particularly worsens when standing up and walking.  States that he had a normal bowel movement around 11 AM.  No hematochezia.  Denies any nausea, vomiting, diarrhea, chest pain, shortness of breath, dysuria, urinary frequency, penile pain, testicular pain. ?  ? ?Home Medications ?Prior to Admission medications   ?Medication Sig Start Date End Date Taking? Authorizing Provider  ?amoxicillin-clavulanate (AUGMENTIN) 875-125 MG tablet Take 1 tablet by mouth 3 (three) times daily for 5 days. 12/19/21 12/24/21 Yes Rayna Sexton, PA-C  ?Aromatic Inhalants (VICKS VAPOR INHALER IN) Inhale into the lungs.   Yes [provider]  ?CRANBERRY PO Take 1 tablet by mouth daily.   Yes [provider]  ?diltiazem (CARDIZEM) 30 MG tablet TAKE 1 TABLET BY MOUTH  DAILY AND PER . TAYLOR MAY  TAKE 1 ADDITIONAL TABLET  DAILY AS NEEDED 10/26/21  Yes Susy Frizzle, MD  ?metoprolol succinate (TOPROL-XL) 25 MG 24 hr tablet Take 1 tablet (25 mg total) by mouth daily. 10/26/21  Yes Susy Frizzle, MD  ?psyllium (EQ FIBER THERAPY) 0.52 g capsule Take 0.52 g by mouth daily.   Yes [provider]  ?triamcinolone (KENALOG) 0.1 % Apply to affected area qd-bid 12/08/20  Yes Sheffield, Vida Roller R, PA-C  ?warfarin (COUMADIN) 5 MG tablet TAKE 1 AND 1/2 TO 2 TABLETS BY MOUTH DAILY OR AS  DIRECTED ?Patient taking differently: Take 5 mg by mouth See  admin instructions. TAKE 1 AND 1/2 TO 2 TABLETS BY MOUTH DAILY OR AS  DIRECTED 01/25/21  Yes Susy Frizzle, MD  ?naproxen sodium (ALEVE) 220 MG tablet Take 220 mg by mouth daily as needed (headaches.).    [provider]  ?triamcinolone cream (KENALOG) 0.1 % Apply 1 application topically daily as needed. ?Patient not taking: Reported on 12/19/2021 10/11/21   Warren Danes, PA-C  ?   ? ?Allergies    ?Patient has no known allergies.   ? ?Review of Systems   ?Review of Systems  ?All other systems reviewed and are negative. ?Ten systems reviewed and are negative for acute change, except as noted in the HPI.   ?Physical Exam ?Updated Vital Signs ?BP 114/68 (BP Location: Left Arm)   Pulse 92   Temp 98 ?F (36.7 ?C) (Oral)   Resp 16   Ht '5\' 10"'$  (1.778 m)   Wt 104.3 kg   SpO2 99%   BMI 33.00 kg/m?  ?Physical Exam ?Vitals and nursing note reviewed.  ?Constitutional:   ?   General: He is not in acute distress. ?   Appearance: Normal appearance. He is not ill-appearing, toxic-appearing or diaphoretic.  ?HENT:  ?   Head: Normocephalic and atraumatic.  ?   Right Ear: External ear normal.  ?   Left Ear: External ear normal.  ?   Nose: Nose normal.  ?   Mouth/Throat:  ?   Mouth: Mucous membranes are moist.  ?  Pharynx: Oropharynx is clear. No oropharyngeal exudate or posterior oropharyngeal erythema.  ?Eyes:  ?   Extraocular Movements: Extraocular movements intact.  ?Cardiovascular:  ?   Rate and Rhythm: Normal rate and regular rhythm.  ?   Pulses: Normal pulses.  ?   Heart sounds: Normal heart sounds. No murmur heard. ?  No friction rub. No gallop.  ?Pulmonary:  ?   Effort: Pulmonary effort is normal. No respiratory distress.  ?   Breath sounds: Normal breath sounds. No stridor. No wheezing, rhonchi or rales.  ?Abdominal:  ?   General: Abdomen is flat.  ?   Palpations: Abdomen is soft.  ?   Tenderness: There is abdominal tenderness in the left lower quadrant.  ?   Comments: Abdomen is flat and soft.  Moderate  tenderness noted in the left lower quadrant.  ?Musculoskeletal:     ?   General: Normal range of motion.  ?   Cervical back: Normal range of motion and neck supple. No tenderness.  ?Skin: ?   General: Skin is warm and dry.  ?Neurological:  ?   General: No focal deficit present.  ?   Mental Status: He is alert and oriented to person, place, and time.  ?Psychiatric:     ?   Mood and Affect: Mood normal.     ?   Behavior: Behavior normal.  ? ?ED Results / Procedures / Treatments   ?Labs ?(all labs ordered are listed, but only abnormal results are displayed) ?Labs Reviewed  ?COMPREHENSIVE METABOLIC PANEL - Abnormal; Notable for the following components:  ?    Result Value  ? Glucose, Bld 135 (*)   ? Creatinine, Ser 1.27 (*)   ? Calcium 8.6 (*)   ? GFR, Estimated 59 (*)   ? All other components within normal limits  ?PROTIME-INR - Abnormal; Notable for the following components:  ? Prothrombin Time 24.0 (*)   ? INR 2.2 (*)   ? All other components within normal limits  ?CBC WITH DIFFERENTIAL/PLATELET  ?LIPASE, BLOOD  ?URINALYSIS, ROUTINE W REFLEX MICROSCOPIC  ? ?EKG ?None ? ?Radiology ?CT ABDOMEN PELVIS W CONTRAST ? ?Result Date: 12/19/2021 ?CLINICAL DATA:  Left lower quadrant abdominal pain beginning yesterday. EXAM: CT ABDOMEN AND PELVIS WITH CONTRAST TECHNIQUE: Multidetector CT imaging of the abdomen and pelvis was performed using the standard protocol following bolus administration of intravenous contrast. RADIATION DOSE REDUCTION: This exam was performed according to the departmental dose-optimization program which includes automated exposure control, adjustment of the mA and/or kV according to patient size and/or use of iterative reconstruction technique. CONTRAST:  148m OMNIPAQUE IOHEXOL 300 MG/ML  SOLN COMPARISON:  None. FINDINGS: Lower chest: Pacemaker in place.  Otherwise negative. Hepatobiliary: Liver appears normal except for a 7 x 9 mm low density in the caudal tip of the right lobe of the liver likely  represent a small cyst or hemangioma. Probable subcentimeter gallstone dependent within the gallbladder. No sign of obstruction or inflammation. Pancreas: Normal Spleen: Normal Adrenals/Urinary Tract: Adrenal glands are normal. Left kidney is normal. No cyst, mass, stone or hydronephrosis. Right kidney contains several cysts, the largest in the midportion measuring 4.5 cm. No hydronephrosis. Mildly thick-walled bladder. Small bladder diverticulum projecting towards the left. Soft tissue density material intruding into the bladder floor, probably related to an enlarged prostate. However, a urothelial mass could not be excluded at the base of the bladder. Stomach/Bowel: Stomach and small intestine are normal. Normal appendix. There is diverticulosis of the left colon. There is  low level acute diverticulitis at the descending/sigmoid junction. Surrounding edema without evidence of a drainable abscess or free fluid. Vascular/Lymphatic: Aortic atherosclerosis. No aneurysm. IVC is normal. No adenopathy. Reproductive: Enlarged prostate as noted above. Other: No free fluid or air. Musculoskeletal: Chronic appearing degenerative change of the lower lumbar spine. IMPRESSION: Acute diverticulitis at the descending/sigmoid junction. Edematous change in the fat but no drainable abscess or free air. Probable subcentimeter gallstone dependent in the gallbladder. Enlarged prostate. 2.7 cm region of tissue protruding into the inferior bladder lumen, favored to represent hypertrophic prostate tissue. However, this could not be differentiated from a urothelial mass. Consider ultrasound for further evaluation. Electronically Signed   By: Nelson Chimes M.D.   On: 12/19/2021 16:06   ? ?Procedures ?Procedures  ? ?Medications Ordered in ED ?Medications  ?iohexol (OMNIPAQUE) 300 MG/ML solution 100 mL (100 mLs Intravenous Contrast Given 12/19/21 1558)  ?amoxicillin-clavulanate (AUGMENTIN) 875-125 MG per tablet 1 tablet (1 tablet Oral Given  12/19/21 1725)  ? ? ?ED Course/ Medical Decision Making/ A&P ?  ?                        ?Medical Decision Making ?Amount and/or Complexity of Data Reviewed ?Labs: ordered. ?Radiology: ordered. ? ?Risk ?Prescription drug

## 2021-12-19 NOTE — ED Triage Notes (Signed)
Pt with LLQ pain since yesterday morning and gradually gotten worse, hurts to stand up and walk. Denies any n/v/d ?

## 2021-12-19 NOTE — Discharge Instructions (Signed)
You have been diagnosed with diverticulitis at this visit.  I have attached information on diverticulitis for you to reference.  I am prescribing you an antibiotic called Augmentin.  Please take this 3 times per day for the next 5 days.  Do not stop taking this early even if you feel that your symptoms have resolved.  Please make sure you follow-up with your regular doctor in 2 days at your scheduled appointment.  I would recommend a clear liquid diet for the next few days to help with your symptoms. ? ?Please continue to monitor your symptoms closely.  If you develop any new or worsening symptoms whatsoever please come back to the emergency department immediately for reevaluation. ?

## 2021-12-21 ENCOUNTER — Ambulatory Visit (INDEPENDENT_AMBULATORY_CARE_PROVIDER_SITE_OTHER): Payer: Medicare Other | Admitting: Family Medicine

## 2021-12-21 VITALS — BP 110/58 | HR 102 | Temp 96.0°F | Ht 70.0 in | Wt 228.4 lb

## 2021-12-21 DIAGNOSIS — N3289 Other specified disorders of bladder: Secondary | ICD-10-CM

## 2021-12-21 NOTE — Progress Notes (Signed)
? ?Subjective:  ? ? Patient ID: Kevin Vazquez, male    DOB: [birth year], 76 y.o.   MRN: [id number] ? ?Patient recently went to the emergency room due to left lower quadrant abdominal pain.  CT scan confirmed diverticulitis and he was started on Augmentin.  The abdominal pain is starting to improve today.  He is only been on 24 hours of antibiotic.  However there was coincidental finding of a mass in his bladder.  Is difficult to determine if this is due to BPH or if there is a malignancy growing off the wall of the bladder.  I reviewed the CT images with the patient.  He has not seen a urologist. ? ?Admission on 12/19/2021, Discharged on 12/19/2021  ?Component Date Value Ref Range Status  ? Sodium 12/19/2021 136  135 - 145 mmol/L Final  ? Potassium 12/19/2021 4.1  3.5 - 5.1 mmol/L Final  ? Chloride 12/19/2021 101  98 - 111 mmol/L Final  ? CO2 12/19/2021 26  22 - 32 mmol/L Final  ? Glucose, Bld 12/19/2021 135 (H)  70 - 99 mg/dL Final  ? Glucose reference range applies only to samples taken after fasting for at least 8 hours.  ? BUN 12/19/2021 18  8 - 23 mg/dL Final  ? Creatinine, Ser 12/19/2021 1.27 (H)  0.61 - 1.24 mg/dL Final  ? Calcium 12/19/2021 8.6 (L)  8.9 - 10.3 mg/dL Final  ? Total Protein 12/19/2021 6.6  6.5 - 8.1 g/dL Final  ? Albumin 12/19/2021 3.9  3.5 - 5.0 g/dL Final  ? AST 12/19/2021 17  15 - 41 U/L Final  ? ALT 12/19/2021 24  0 - 44 U/L Final  ? Alkaline Phosphatase 12/19/2021 53  38 - 126 U/L Final  ? Total Bilirubin 12/19/2021 0.8  0.3 - 1.2 mg/dL Final  ? GFR, Estimated 12/19/2021 59 (L)  >60 mL/min Final  ? Comment: (NOTE) ?Calculated using the CKD-EPI Creatinine Equation (2021) ?  ? Anion gap 12/19/2021 9  5 - 15 Final  ? Performed at [facility], [address], [place], [place] [address]  ? WBC 12/19/2021 9.3  4.0 - 10.5 K/uL Final  ? RBC 12/19/2021 4.80  4.22 - 5.81 MIL/uL Final  ? Hemoglobin 12/19/2021 15.1  13.0 - 17.0 g/dL Final  ? HCT 12/19/2021 44.3  39.0 - 52.0 % Final  ? MCV 12/19/2021  92.3  80.0 - 100.0 fL Final  ? MCH 12/19/2021 31.5  26.0 - 34.0 pg Final  ? MCHC 12/19/2021 34.1  30.0 - 36.0 g/dL Final  ? RDW 12/19/2021 13.4  11.5 - 15.5 % Final  ? Platelets 12/19/2021 184  150 - 400 K/uL Final  ? nRBC 12/19/2021 0.0  0.0 - 0.2 % Final  ? Neutrophils Relative % 12/19/2021 83  % Final  ? Neutro Abs 12/19/2021 7.7  1.7 - 7.7 K/uL Final  ? Lymphocytes Relative 12/19/2021 10  % Final  ? Lymphs Abs 12/19/2021 0.9  0.7 - 4.0 K/uL Final  ? Monocytes Relative 12/19/2021 5  % Final  ? Monocytes Absolute 12/19/2021 0.5  0.1 - 1.0 K/uL Final  ? Eosinophils Relative 12/19/2021 2  % Final  ? Eosinophils Absolute 12/19/2021 0.1  0.0 - 0.5 K/uL Final  ? Basophils Relative 12/19/2021 0  % Final  ? Basophils Absolute 12/19/2021 0.0  0.0 - 0.1 K/uL Final  ? Immature Granulocytes 12/19/2021 0  % Final  ? Abs Immature Granulocytes 12/19/2021 0.02  0.00 - 0.07 K/uL Final  ?  Performed at [facility], [address], [place], [place] [address]  ? Lipase 12/19/2021 30  11 - 51 U/L Final  ? Performed at [facility], [address] [place], [place] [address]  ? Prothrombin Time 12/19/2021 24.0 (H)  11.4 - 15.2 seconds Final  ? INR 12/19/2021 2.2 (H)  0.8 - 1.2 Final  ? Comment: (NOTE) ?INR goal varies based on device and disease states. ?Performed at [facility], [address], [place], [place] [address] ?  ? Color, Urine 12/19/2021 YELLOW  YELLOW Final  ? APPearance 12/19/2021 CLEAR  CLEAR Final  ? Specific Gravity, Urine 12/19/2021 1.019  1.005 - 1.030 Final  ? pH 12/19/2021 5.0  5.0 - 8.0 Final  ? Glucose, UA 12/19/2021 NEGATIVE  NEGATIVE mg/dL Final  ? Hgb urine dipstick 12/19/2021 NEGATIVE  NEGATIVE Final  ? Bilirubin Urine 12/19/2021 NEGATIVE  NEGATIVE Final  ? Ketones, ur 12/19/2021 NEGATIVE  NEGATIVE mg/dL Final  ? Protein, ur 12/19/2021 NEGATIVE  NEGATIVE mg/dL Final  ? Nitrite 12/19/2021 NEGATIVE  NEGATIVE Final  ? Leukocytes,Ua 12/19/2021 NEGATIVE  NEGATIVE Final  ? Performed at [facility], [address], [place],  [address]  ? ?Past Medical History:  ?Diagnosis Date  ? Atrial fibrillation, permanent ([place])   ? Basal cell carcinoma 04/02/2017  ? Left upper lip - CX3 + cautery + 5FU  ? BPH (benign prostatic hyperplasia)   ? Melanoma ([place]) 08/31/2004  ? Level III - upper right back shoulder (Dr. Clovis Riley)  ? Presence of permanent cardiac pacemaker   ? Sick sinus syndrome ([place])   ? Squamous cell carcinoma of skin 04/02/2017  ? Left temple - CX3 + cautery + 5FU  ? Squamous cell carcinoma of skin 04/11/2018  ? Left forearm - CX3 + cautery + 5FU  ? Squamous cell carcinoma of skin 07/19/20019  ? Left forehead - CX3 + cautery + 5FU  ? Squamous cell carcinoma of skin 09/16/2019  ? Left inner arm - tx p bx  ? ?Past Surgical History:  ?Procedure Laterality Date  ? CARDIAC CATHETERIZATION  01/26/1997  ? Normal LV function. Normal coronary arteries.  ? CARDIOVASCULAR STRESS TEST  05/02/2011  ? Normal perfusion scan demonstrating diaphragmatic artifact. No significant ischemia demonstrated   ? LEFT LOWER EXTREMITY VENOUS DOPPLER  02/07/2009  ? 3.8x1.3x2.8cm hypoechoic area in lateral L thigh. No evidence of DVT.  ? PACEMAKER INSERTION  04/24/2010  ? Medtronic Adapta, model #ADSRO1, serial [id number]  ? PPM GENERATOR CHANGEOUT N/A 10/24/2020  ? Procedure: PPM GENERATOR CHANGEOUT;  Surgeon: Evans Lance, MD;  Location: [facility];  Service: Cardiovascular;  Laterality: N/A;  ? TRANSTHORACIC ECHOCARDIOGRAM  11/26/2012  ? EF 50-55%, LA-appendage was moderately dilated  ? ?Current Outpatient Medications on File Prior to Visit  ?Medication Sig Dispense Refill  ? amoxicillin-clavulanate (AUGMENTIN) 875-125 MG tablet Take 1 tablet by mouth 3 (three) times daily for 5 days. 15 tablet 0  ? Aromatic Inhalants (VICKS VAPOR INHALER IN) Inhale into the lungs.    ? CRANBERRY PO Take 1 tablet by mouth daily.    ? diltiazem (CARDIZEM) 30 MG tablet TAKE 1 TABLET BY MOUTH  DAILY AND PER . TAYLOR MAY  TAKE 1 ADDITIONAL TABLET  DAILY AS NEEDED 180  tablet 3  ? metoprolol succinate (TOPROL-XL) 25 MG 24 hr tablet Take 1 tablet (25 mg total) by mouth daily. 90 tablet 3  ? naproxen sodium (ALEVE) 220 MG tablet Take 220 mg by mouth daily as needed (  headaches.).    ? psyllium (EQ FIBER THERAPY) 0.52 g capsule Take 0.52 g by mouth daily.    ? triamcinolone (KENALOG) 0.1 % Apply to affected area qd-bid 454 g 2  ? triamcinolone cream (KENALOG) 0.1 % Apply 1 application topically daily as needed. 453 g 3  ? warfarin (COUMADIN) 5 MG tablet TAKE 1 AND 1/2 TO 2 TABLETS BY MOUTH DAILY OR AS  DIRECTED (Patient taking differently: Take 5 mg by mouth See admin instructions. TAKE 1 AND 1/2 TO 2 TABLETS BY MOUTH DAILY OR AS  DIRECTED) 180 tablet 3  ? ?No current facility-administered medications on file prior to visit.  ? ?No Known Allergies ?Social History  ? ?Socioeconomic History  ? Marital status: Married  ?  Spouse name: Not on file  ? Number of children: Not on file  ? Years of education: Not on file  ? Highest education level: Not on file  ?Occupational History  ? Occupation: Dealer Retired  ?Tobacco Use  ? Smoking status: Never  ? Smokeless tobacco: Former  ?  Types: Chew  ?Vaping Use  ? Vaping Use: Never used  ?Substance and Sexual Activity  ? Alcohol use: Yes  ?  Alcohol/week: 7.0 standard drinks  ?  Types: 7 Standard drinks or equivalent per week  ? Drug use: No  ? Sexual activity: Not on file  ?Other Topics Concern  ? Not on file  ?Social History Narrative  ? Not on file  ? ?Social Determinants of Health  ? ?Financial Resource Strain: Not on file  ?Food Insecurity: Not on file  ?Transportation Needs: Not on file  ?Physical Activity: Not on file  ?Stress: Not on file  ?Social Connections: Not on file  ?Intimate Partner Violence: Not on file  ? ?Family History  ?Problem Relation Age of Onset  ? Heart disease Other   ? ? ? ? ?Review of Systems  ?Gastrointestinal:  Positive for abdominal pain.  ?All other systems reviewed and are negative. ? ?   ?Objective:  ? Physical  Exam ?Vitals reviewed.  ?Constitutional:   ?   General: He is not in acute distress. ?   Appearance: Normal appearance. He is well-developed. He is not ill-appearing, toxic-appearing or diaphoretic.

## 2021-12-25 ENCOUNTER — Encounter: Payer: Self-pay | Admitting: Urology

## 2021-12-25 ENCOUNTER — Ambulatory Visit: Payer: Medicare Other | Admitting: Urology

## 2021-12-25 ENCOUNTER — Other Ambulatory Visit: Payer: Self-pay | Admitting: Family Medicine

## 2021-12-25 VITALS — BP 97/63 | HR 102 | Ht 70.0 in | Wt 228.4 lb

## 2021-12-25 DIAGNOSIS — R3912 Poor urinary stream: Secondary | ICD-10-CM

## 2021-12-25 DIAGNOSIS — N401 Enlarged prostate with lower urinary tract symptoms: Secondary | ICD-10-CM | POA: Diagnosis not present

## 2021-12-25 DIAGNOSIS — N138 Other obstructive and reflux uropathy: Secondary | ICD-10-CM | POA: Diagnosis not present

## 2021-12-25 NOTE — Progress Notes (Signed)
? ?12/25/2021 ?3:09 PM  ? ?Owens Loffler ?June 02, 1946 ?[id number] ? ?Referring provider: Susy Frizzle, MD ?[address] [place],  [place] [address] ? ?No chief complaint on file. ? ? ?HPI: ? ?New pt -  ? ?1) BPH - pt with intermittent stream, weak stream and frequency. Noc x 0-1. AUASS = 16, mostly satisfied. He underwent CT Mar 2023 for left LQ pain and had a benign GU tract. Prostate measured about 50 grams with a median lobe (possible "urothelial mass"). Right renal cyst. PSA was 2.08 in May 2022.  ? ?No GU meds or surgery. No gross hematuria. Takes a supplement.  ? ?His UA is clear.  ? ? ?PMH: ?Past Medical History:  ?Diagnosis Date  ? Atrial fibrillation, permanent ([place])   ? Basal cell carcinoma 04/02/2017  ? Left upper lip - CX3 + cautery + 5FU  ? BPH (benign prostatic hyperplasia)   ? Melanoma ([place]) 08/31/2004  ? Level III - upper right back shoulder (Dr. Clovis Riley)  ? Presence of permanent cardiac pacemaker   ? Sick sinus syndrome ([place])   ? Squamous cell carcinoma of skin 04/02/2017  ? Left temple - CX3 + cautery + 5FU  ? Squamous cell carcinoma of skin 04/11/2018  ? Left forearm - CX3 + cautery + 5FU  ? Squamous cell carcinoma of skin 07/19/20019  ? Left forehead - CX3 + cautery + 5FU  ? Squamous cell carcinoma of skin 09/16/2019  ? Left inner arm - tx p bx  ? ? ?Surgical History: ?Past Surgical History:  ?Procedure Laterality Date  ? CARDIAC CATHETERIZATION  01/26/1997  ? Normal LV function. Normal coronary arteries.  ? CARDIOVASCULAR STRESS TEST  05/02/2011  ? Normal perfusion scan demonstrating diaphragmatic artifact. No significant ischemia demonstrated   ? LEFT LOWER EXTREMITY VENOUS DOPPLER  02/07/2009  ? 3.8x1.3x2.8cm hypoechoic area in lateral L thigh. No evidence of DVT.  ? PACEMAKER INSERTION  04/24/2010  ? Medtronic Adapta, model #ADSRO1, serial [id number]  ? PPM GENERATOR CHANGEOUT N/A 10/24/2020  ? Procedure: PPM GENERATOR CHANGEOUT;  Surgeon: Evans Lance, MD;  Location: [facility];   Service: Cardiovascular;  Laterality: N/A;  ? TRANSTHORACIC ECHOCARDIOGRAM  11/26/2012  ? EF 50-55%, LA-appendage was moderately dilated  ? ? ?Home Medications:  ?Allergies as of 12/25/2021   ?No Known Allergies ?  ? ?  ?Medication List  ?  ? ?  ? Accurate as of December 25, 2021  3:09 PM. If you have any questions, ask your nurse or doctor.  ?  ?  ? ?  ? ?CRANBERRY PO ?Take 1 tablet by mouth daily. ?  ?diltiazem 30 MG tablet ?Commonly known as: CARDIZEM ?TAKE 1 TABLET BY MOUTH  DAILY AND PER . TAYLOR MAY  TAKE 1 ADDITIONAL TABLET  DAILY AS NEEDED ?  ?EQ Fiber Therapy 0.52 g capsule ?Generic drug: psyllium ?Take 0.52 g by mouth daily. ?  ?metoprolol succinate 25 MG 24 hr tablet ?Commonly known as: TOPROL-XL ?Take 1 tablet (25 mg total) by mouth daily. ?  ?naproxen sodium 220 MG tablet ?Commonly known as: ALEVE ?Take 220 mg by mouth daily as needed (headaches.). ?  ?triamcinolone cream 0.1 % ?Commonly known as: KENALOG ?Apply to affected area qd-bid ?  ?triamcinolone cream 0.1 % ?Commonly known as: KENALOG ?Apply 1 application topically daily as needed. ?  ?VICKS VAPOR INHALER IN ?Inhale into the lungs. ?  ?warfarin 5 MG tablet ?Commonly known as: COUMADIN ?Take as directed by the  anticoagulation clinic. If you are unsure how to take this medication, talk to your nurse or doctor. ?Original instructions: TAKE 1 AND 1/2 TO 2 TABLETS BY MOUTH DAILY OR AS  DIRECTED ?What changed: See the new instructions. ?  ? ?  ? ? ?Allergies: No Known Allergies ? ?Family History: ?Family History  ?Problem Relation Age of Onset  ? Heart disease Other   ? ? ?Social History:  reports that he has never smoked. He has quit using smokeless tobacco.  His smokeless tobacco use included chew. He reports current alcohol use of about 7.0 standard drinks per week. He reports that he does not use drugs. ? ? ?Physical Exam: ?BP 97/63   Pulse (!) 102   Ht '5\' 10"'$  (1.778 m)   Wt 228 lb 6.4 oz (103.6 kg)   BMI 32.77 kg/m?   ?Constitutional:  Alert and  oriented, No acute distress. ?HEENT: [place] AT, moist mucus membranes.  Trachea midline, no masses. ?Cardiovascular: No clubbing, cyanosis, or edema. ?Respiratory: Normal respiratory effort, no increased work of breathing. ?GI: Abdomen is soft, nontender, nondistended, no abdominal masses ?GU: No CVA tenderness ?Lymph: No cervical or inguinal lymphadenopathy. ?Skin: No rashes, bruises or suspicious lesions. ?Neurologic: Grossly intact, no focal deficits, moving all 4 extremities. ?Psychiatric: Normal mood and affect. ?DRE: Prostate 40 g, smooth without hard area or nodule ? ? ?Laboratory Data: ?Lab Results  ?Component Value Date  ? WBC 9.3 12/19/2021  ? HGB 15.1 12/19/2021  ? HCT 44.3 12/19/2021  ? MCV 92.3 12/19/2021  ? PLT 184 12/19/2021  ? ? ?Lab Results  ?Component Value Date  ? CREATININE 1.27 (H) 12/19/2021  ? ? ?Lab Results  ?Component Value Date  ? PSA 2.08 02/17/2021  ? PSA 2.1 06/21/2017  ? PSA 2.4 06/18/2016  ? ? ?No results found for: TESTOSTERONE ? ?Lab Results  ?Component Value Date  ? HGBA1C 5.7 (H) 11/10/2019  ? ? ?Urinalysis ?   ?Component Value Date/Time  ? COLORURINE YELLOW 12/19/2021 1342  ? APPEARANCEUR CLEAR 12/19/2021 1342  ? LABSPEC 1.019 12/19/2021 1342  ? PHURINE 5.0 12/19/2021 1342  ? GLUCOSEU NEGATIVE 12/19/2021 1342  ? HGBUR NEGATIVE 12/19/2021 1342  ? [place] NEGATIVE 12/19/2021 1342  ? [place] NEGATIVE 12/19/2021 1342  ? PROTEINUR NEGATIVE 12/19/2021 1342  ? NITRITE NEGATIVE 12/19/2021 1342  ? LEUKOCYTESUR NEGATIVE 12/19/2021 1342  ? ? ?Lab Results  ?Component Value Date  ? BACTERIA FEW (A) 06/05/2016  ? ? ?Pertinent Imaging: ? ? ? ?Assessment & Plan:   ? ?1. BPH, LUTS - he was reassured. CT findings almost certainly a median lobe. Discussed the nature r/b/a to cystoscopy and he will follow-up for a quick look in the bladder.  ? ?- Urinalysis, Routine w reflex microscopic ? ? ?No follow-ups on file. ? ?Festus Aloe, MD ? ?[facility]  ?[place], [place] [address] ?[phone] ? ? ?

## 2021-12-27 ENCOUNTER — Ambulatory Visit (INDEPENDENT_AMBULATORY_CARE_PROVIDER_SITE_OTHER): Payer: Medicare Other | Admitting: *Deleted

## 2021-12-27 DIAGNOSIS — I4891 Unspecified atrial fibrillation: Secondary | ICD-10-CM | POA: Diagnosis not present

## 2021-12-27 DIAGNOSIS — Z5181 Encounter for therapeutic drug level monitoring: Secondary | ICD-10-CM | POA: Diagnosis not present

## 2021-12-27 LAB — POCT INR: INR: 2.5 (ref 2.0–3.0)

## 2021-12-27 NOTE — Patient Instructions (Signed)
Continue warfarin 1 1/2 tablets daily ?Continue greens ?Recheck in 6 wks  ?

## 2022-01-31 ENCOUNTER — Ambulatory Visit: Payer: Medicare Other | Admitting: Internal Medicine

## 2022-01-31 ENCOUNTER — Encounter: Payer: Self-pay | Admitting: Internal Medicine

## 2022-01-31 DIAGNOSIS — I495 Sick sinus syndrome: Secondary | ICD-10-CM

## 2022-01-31 NOTE — Patient Instructions (Signed)
Medication Instructions:  Your physician recommends that you continue on your current medications as directed. Please refer to the Current Medication list given to you today.  *If you need a refill on your cardiac medications before your next appointment, please call your pharmacy*   Lab Work: NONE   If you have labs (blood work) drawn today and your tests are completely normal, you will receive your results only by: MyChart Message (if you have MyChart) OR A paper copy in the mail If you have any lab test that is abnormal or we need to change your treatment, we will call you to review the results.   Testing/Procedures: NONE    Follow-Up: At [facility], you and your health needs are our priority.  As part of our continuing mission to provide you with exceptional heart care, we have created designated Provider Care Teams.  These Care Teams include your primary Cardiologist (physician) and Advanced Practice Providers (APPs -  Physician Assistants and Nurse Practitioners) who all work together to provide you with the care you need, when you need it.  We recommend signing up for the patient portal called "MyChart".  Sign up information is provided on this After Visit Summary.  MyChart is used to connect with patients for Virtual Visits (Telemedicine).  Patients are able to view lab/test results, encounter notes, upcoming appointments, etc.  Non-urgent messages can be sent to your provider as well.   To learn more about what you can do with MyChart, go to [email or web address].    Your next appointment:   1 year(s)  The format for your next appointment:   In Person  Provider:   Gregg Taylor, MD    Other Instructions Thank you for choosing [facility]!    Important Information About Sugar       

## 2022-01-31 NOTE — Progress Notes (Signed)
? ? ? ? ?HPI ?Kevin Vazquez returns today for ongoing evaluation of chronic atrial fibrillation, intermitent complete heart block, status post pacemaker insertion, and hypertension.  The patient has done well in the interim. He notes that when he is doing something strenuous, his heart will speed up and he will feel the palpitations. However, this has improved some since his toprol was started.  He has not had syncope.  He denies chest pain.  He has class IIa dyspnea.  No peripheral edema.  He remains active maintaining rental houses. ? ?No Known Allergies ? ? ?Current Outpatient Medications  ?Medication Sig Dispense Refill  ? Aromatic Inhalants (VICKS VAPOR INHALER IN) Inhale into the lungs.    ? CRANBERRY PO Take 1 tablet by mouth daily.    ? diltiazem (CARDIZEM) 30 MG tablet TAKE 1 TABLET BY MOUTH  DAILY AND PER . Sheresa Cullop MAY  TAKE 1 ADDITIONAL TABLET  DAILY AS NEEDED 180 tablet 3  ? metoprolol succinate (TOPROL-XL) 25 MG 24 hr tablet TAKE ONE-HALF TABLET BY  MOUTH DAILY 45 tablet 3  ? naproxen sodium (ALEVE) 220 MG tablet Take 220 mg by mouth daily as needed (headaches.).    ? psyllium (EQ FIBER THERAPY) 0.52 g capsule Take 0.52 g by mouth daily.    ? triamcinolone (KENALOG) 0.1 % Apply to affected area qd-bid 454 g 2  ? triamcinolone cream (KENALOG) 0.1 % Apply 1 application topically daily as needed. 453 g 3  ? warfarin (COUMADIN) 5 MG tablet TAKE 1 AND 1/2 TO 2 TABLETS BY MOUTH DAILY OR AS  DIRECTED 180 tablet 3  ? ?No current facility-administered medications for this visit.  ? ? ? ?Past Medical History:  ?Diagnosis Date  ? Atrial fibrillation, permanent ([place])   ? Basal cell carcinoma 04/02/2017  ? Left upper lip - CX3 + cautery + 5FU  ? BPH (benign prostatic hyperplasia)   ? Melanoma ([place]) 08/31/2004  ? Level III - upper right back shoulder (Dr. Clovis Riley)  ? Presence of permanent cardiac pacemaker   ? Sick sinus syndrome ([place])   ? Squamous cell carcinoma of skin 04/02/2017  ? Left temple - CX3 + cautery + 5FU  ?  Squamous cell carcinoma of skin 04/11/2018  ? Left forearm - CX3 + cautery + 5FU  ? Squamous cell carcinoma of skin 07/19/20019  ? Left forehead - CX3 + cautery + 5FU  ? Squamous cell carcinoma of skin 09/16/2019  ? Left inner arm - tx p bx  ? ? ?ROS: ? ? All systems reviewed and negative except as noted in the HPI. ? ? ?Past Surgical History:  ?Procedure Laterality Date  ? CARDIAC CATHETERIZATION  01/26/1997  ? Normal LV function. Normal coronary arteries.  ? CARDIOVASCULAR STRESS TEST  05/02/2011  ? Normal perfusion scan demonstrating diaphragmatic artifact. No significant ischemia demonstrated   ? LEFT LOWER EXTREMITY VENOUS DOPPLER  02/07/2009  ? 3.8x1.3x2.8cm hypoechoic area in lateral L thigh. No evidence of DVT.  ? PACEMAKER INSERTION  04/24/2010  ? Medtronic Adapta, model #ADSRO1, serial [id number]  ? PPM GENERATOR CHANGEOUT N/A 10/24/2020  ? Procedure: PPM GENERATOR CHANGEOUT;  Surgeon: Evans Lance, MD;  Location: [facility];  Service: Cardiovascular;  Laterality: N/A;  ? TRANSTHORACIC ECHOCARDIOGRAM  11/26/2012  ? EF 50-55%, LA-appendage was moderately dilated  ? ? ? ?Family History  ?Problem Relation Age of Onset  ? Heart disease Other   ? ? ? ?Social History  ? ?Socioeconomic History  ? Marital  status: Married  ?  Spouse name: Not on file  ? Number of children: Not on file  ? Years of education: Not on file  ? Highest education level: Not on file  ?Occupational History  ? Occupation: Dealer Retired  ?Tobacco Use  ? Smoking status: Never  ? Smokeless tobacco: Former  ?  Types: Chew  ?Vaping Use  ? Vaping Use: Never used  ?Substance and Sexual Activity  ? Alcohol use: Yes  ?  Alcohol/week: 7.0 standard drinks  ?  Types: 7 Standard drinks or equivalent per week  ? Drug use: No  ? Sexual activity: Not on file  ?Other Topics Concern  ? Not on file  ?Social History Narrative  ? Not on file  ? ?Social Determinants of Health  ? ?Financial Resource Strain: Not on file  ?Food Insecurity: Not on file   ?Transportation Needs: Not on file  ?Physical Activity: Not on file  ?Stress: Not on file  ?Social Connections: Not on file  ?Intimate Partner Violence: Not on file  ? ? ? ?BP 120/80   Pulse 78   Ht '5\' 10"'$  (1.778 m)   Wt 228 lb (103.4 kg)   SpO2 97%   BMI 32.71 kg/m?  ? ?Physical Exam: ? ?Well appearing NAD ?HEENT: Unremarkable ?Neck:  No JVD, no thyromegally ?Lymphatics:  No adenopathy ?Back:  No CVA tenderness ?Lungs:  Clear with no wheezes ?HEART:  IRegular rate rhythm, no murmurs, no rubs, no clicks ?Abd:  soft, positive bowel sounds, no organomegally, no rebound, no guarding ?Ext:  2 plus pulses, no edema, no cyanosis, no clubbing ?Skin:  No rashes no nodules ?Neuro:  CN II through XII intact, motor grossly intact ? ?EKG - atrial fib with a CVR ? ?DEVICE  ?Normal device function.  See PaceArt for details.  ? ?Assess/Plan:  ? ?1. Atrial fib - his rates are fairly well controlled. I asked him to take an extra cardizem as needed for increased HR's. ? ?2. Coags - he will continue his warfarin ? ?3. PPM - his medtronic device is working normally. We will recheck  in the office in our device clinic in several months. ?4. Obesity - he is encouraged to lose weight.  ?  ?Kevin Overlie Nafeesa Dils,MD ?

## 2022-02-01 NOTE — Addendum Note (Signed)
Addended by: Levonne Hubert on: 02/01/2022 08:16 AM ? ? Modules accepted: Orders ? ?

## 2022-02-05 ENCOUNTER — Ambulatory Visit: Payer: Medicare Other | Admitting: Urology

## 2022-02-05 VITALS — BP 111/76 | HR 84

## 2022-02-05 DIAGNOSIS — R3912 Poor urinary stream: Secondary | ICD-10-CM | POA: Diagnosis not present

## 2022-02-05 DIAGNOSIS — N138 Other obstructive and reflux uropathy: Secondary | ICD-10-CM

## 2022-02-05 DIAGNOSIS — N401 Enlarged prostate with lower urinary tract symptoms: Secondary | ICD-10-CM | POA: Diagnosis not present

## 2022-02-05 LAB — CUP PACEART INCLINIC DEVICE CHECK
Date Time Interrogation Session: 20230515111534
Implantable Lead Implant Date: 20020722
Implantable Lead Location: 753860
Implantable Lead Model: 5092
Implantable Pulse Generator Implant Date: 20220131

## 2022-02-05 LAB — URINALYSIS, ROUTINE W REFLEX MICROSCOPIC
Bilirubin, UA: NEGATIVE
Glucose, UA: NEGATIVE
Ketones, UA: NEGATIVE
Leukocytes,UA: NEGATIVE
Nitrite, UA: NEGATIVE
Protein,UA: NEGATIVE
RBC, UA: NEGATIVE
Specific Gravity, UA: >1.03 — ABNORMAL HIGH (ref 1.005–1.030)
Urobilinogen, Ur: 0.2 mg/dL (ref 0.2–1.0)
pH, UA: 5.5 (ref 5.0–7.5)

## 2022-02-05 MED ORDER — CIPROFLOXACIN HCL 500 MG PO TABS
500.0000 mg | ORAL_TABLET | Freq: Once | ORAL | Status: AC
  Administered 2022-02-05: 500 mg via ORAL

## 2022-02-05 MED ORDER — TAMSULOSIN HCL 0.4 MG PO CAPS: 0.4000 mg | ORAL_CAPSULE | Freq: Every day | ORAL | 11 refills | Status: DC

## 2022-02-05 NOTE — Progress Notes (Signed)
? ?  02/05/22 ? ?CC: No chief complaint on file. ? ? ?HPI: ? ?F/u -  ? ?1) BPH - pt with intermittent stream, weak stream and frequency. Noc x 0-1. AUASS = 16, mostly satisfied. He doesn't always feel like he empties his bladder. He underwent CT Mar 2023 for left LQ pain and had a benign GU tract. Prostate measured about 50 grams with a median lobe (possible "urothelial mass"). Right renal cyst. PSA was 2.08 in May 2022.  ?  ?No GU meds or surgery. No gross hematuria. Takes a supplement.  ?  ?His UA is clear.  ? ?He returns in management of BPH and to eval bladder for mass with cystoscopy.  ? ?Blood pressure 111/76, pulse 84. ?NED. A&Ox3.   ?No respiratory distress   ?Abd soft, NT, ND ?Normal phallus with bilateral descended testicles ? ?Cystoscopy Procedure Note ? ?Patient identification was confirmed, informed consent was obtained, and patient was prepped using Betadine solution.  Lidocaine jelly was administered per urethral meatus.   ? ? ?Pre-Procedure: ?- Inspection reveals a normal caliber ureteral meatus. ? ?Procedure: ?The flexible cystoscope was introduced without difficulty ?- No urethral strictures/lesions are present. ?- Enlarged prostate lateral lobes and median lobe ball valve obstruction  ?- Tight bladder neck ?- Bilateral ureteral orifices identified ?- Bladder mucosa  reveals no ulcers, tumors, or lesions ?- No bladder stones ?- moderate to severe trabeculation ? ?Retroflexion shows median lobe of prostate  ? ? ?Post-Procedure: ?- Patient tolerated the procedure well ? ?Assessment/ Plan: ? ?1) bladder mass - no bladder mass, simply a moderate size median lobe.  ? ?2) BPH - we went over the nature r/b/a to alpha blockers, 5ari and procedures (good candidate for LVP, TURP or WJT). He will start tamsulosin. F/u in 3 mo for PVR with PSA prior.  ? ?No follow-ups on file. ? ?Festus Aloe, MD ? ?

## 2022-02-07 ENCOUNTER — Ambulatory Visit: Payer: Medicare Other | Admitting: Physician Assistant

## 2022-02-07 ENCOUNTER — Ambulatory Visit (INDEPENDENT_AMBULATORY_CARE_PROVIDER_SITE_OTHER): Payer: Medicare Other | Admitting: *Deleted

## 2022-02-07 DIAGNOSIS — Z5181 Encounter for therapeutic drug level monitoring: Secondary | ICD-10-CM | POA: Diagnosis not present

## 2022-02-07 DIAGNOSIS — I4891 Unspecified atrial fibrillation: Secondary | ICD-10-CM | POA: Diagnosis not present

## 2022-02-07 LAB — POCT INR: INR: 3.7 — AB (ref 2.0–3.0)

## 2022-02-07 NOTE — Patient Instructions (Signed)
Hold warfarin tomorrow then resume 1 1/2 tablets daily ?Continue greens ?Recheck in 6 wks  ?

## 2022-03-21 ENCOUNTER — Ambulatory Visit (INDEPENDENT_AMBULATORY_CARE_PROVIDER_SITE_OTHER): Payer: Medicare Other | Admitting: *Deleted

## 2022-03-21 DIAGNOSIS — Z5181 Encounter for therapeutic drug level monitoring: Secondary | ICD-10-CM | POA: Diagnosis not present

## 2022-03-21 DIAGNOSIS — I4891 Unspecified atrial fibrillation: Secondary | ICD-10-CM

## 2022-03-21 LAB — POCT INR: INR: 3.9 — AB (ref 2.0–3.0)

## 2022-03-21 NOTE — Patient Instructions (Signed)
Hold warfarin tomorrow then decrease dose to 1 1/2 tablets daily except 1 tablet on Mondays, Wednesdays and Fridays Continue greens Recheck in 3 wks

## 2022-04-02 ENCOUNTER — Ambulatory Visit (INDEPENDENT_AMBULATORY_CARE_PROVIDER_SITE_OTHER): Payer: Medicare Other | Admitting: Family Medicine

## 2022-04-02 VITALS — BP 98/62 | HR 98 | Temp 98.1°F | Ht 70.0 in | Wt 226.0 lb

## 2022-04-02 DIAGNOSIS — H9113 Presbycusis, bilateral: Secondary | ICD-10-CM | POA: Diagnosis not present

## 2022-04-02 NOTE — Progress Notes (Signed)
Subjective:    Patient ID: Kevin Vazquez, male    DOB: 01-26-1946, 76 y.o.   MRN: [id number]  HPI  Patient is here today requesting that I check his ears.  There is no cerumen impaction on either side.  There is no erythema in the tympanic membrane.  There is no middle ear effusion.  The patient denies any ear pain.  He does have right-sided greater than left-sided hearing loss but this is a chronic finding. Past Medical History:  Diagnosis Date  . Atrial fibrillation, permanent ([place])   . Basal cell carcinoma 04/02/2017   Left upper lip - CX3 + cautery + 5FU  . BPH (benign prostatic hyperplasia)   . Melanoma ([place]) 08/31/2004   Level III - upper right back shoulder (Dr. Clovis Riley)  . Presence of permanent cardiac pacemaker   . Sick sinus syndrome ([place])   . Squamous cell carcinoma of skin 04/02/2017   Left temple - CX3 + cautery + 5FU  . Squamous cell carcinoma of skin 04/11/2018   Left forearm - CX3 + cautery + 5FU  . Squamous cell carcinoma of skin 07/19/20019   Left forehead - CX3 + cautery + 5FU  . Squamous cell carcinoma of skin 09/16/2019   Left inner arm - tx p bx   Past Surgical History:  Procedure Laterality Date  . CARDIAC CATHETERIZATION  01/26/1997   Normal LV function. Normal coronary arteries.  Marland Kitchen CARDIOVASCULAR STRESS TEST  05/02/2011   Normal perfusion scan demonstrating diaphragmatic artifact. No significant ischemia demonstrated   . LEFT LOWER EXTREMITY VENOUS DOPPLER  02/07/2009   3.8x1.3x2.8cm hypoechoic area in lateral L thigh. No evidence of DVT.  Marland Kitchen PACEMAKER INSERTION  04/24/2010   Medtronic Adapta, model #ADSRO1, serial [id number]  . PPM GENERATOR CHANGEOUT N/A 10/24/2020   Procedure: PPM GENERATOR CHANGEOUT;  Surgeon: Evans Lance, MD;  Location: [facility];  Service: Cardiovascular;  Laterality: N/A;  . TRANSTHORACIC ECHOCARDIOGRAM  11/26/2012   EF 50-55%, LA-appendage was moderately dilated   Current Outpatient Medications on File Prior to Visit   Medication Sig Dispense Refill  . Aromatic Inhalants (VICKS VAPOR INHALER IN) Inhale into the lungs.    . CRANBERRY PO Take 1 tablet by mouth daily.    Marland Kitchen diltiazem (CARDIZEM) 30 MG tablet TAKE 1 TABLET BY MOUTH  DAILY AND PER . TAYLOR MAY  TAKE 1 ADDITIONAL TABLET  DAILY AS NEEDED 180 tablet 3  . metoprolol succinate (TOPROL-XL) 25 MG 24 hr tablet TAKE ONE-HALF TABLET BY  MOUTH DAILY 45 tablet 3  . psyllium (EQ FIBER THERAPY) 0.52 g capsule Take 0.52 g by mouth daily.    . tamsulosin (FLOMAX) 0.4 MG CAPS capsule Take 1 capsule (0.4 mg total) by mouth daily. 30 capsule 11  . triamcinolone (KENALOG) 0.1 % Apply to affected area qd-bid 454 g 2  . triamcinolone cream (KENALOG) 0.1 % Apply 1 application topically daily as needed. 453 g 3  . warfarin (COUMADIN) 5 MG tablet TAKE 1 AND 1/2 TO 2 TABLETS BY MOUTH DAILY OR AS  DIRECTED 180 tablet 3   No current facility-administered medications on file prior to visit.   No Known Allergies Social History   Socioeconomic History  . Marital status: Married    Spouse name: Not on file  . Number of children: Not on file  . Years of education: Not on file  . Highest education level: Not on file  Occupational History  . Occupation: Dealer Retired  Tobacco  Use  . Smoking status: Never  . Smokeless tobacco: Former    Types: Secondary school teacher  . Vaping Use: Never used  Substance and Sexual Activity  . Alcohol use: Yes    Alcohol/week: 7.0 standard drinks of alcohol    Types: 7 Standard drinks or equivalent per week  . Drug use: No  . Sexual activity: Not on file  Other Topics Concern  . Not on file  Social History Narrative  . Not on file   Social Determinants of Health   Financial Resource Strain: Not on file  Food Insecurity: Not on file  Transportation Needs: Not on file  Physical Activity: Not on file  Stress: Not on file  Social Connections: Not on file  Intimate Partner Violence: Not on file   Family History  Problem Relation  Age of Onset  . Heart disease Other       Review of Systems  All other systems reviewed and are negative.      Objective:   Physical Exam Vitals reviewed.  Constitutional:      General: He is not in acute distress.    Appearance: Normal appearance. He is well-developed. He is not ill-appearing, toxic-appearing or diaphoretic.  HENT:     Head: Normocephalic and atraumatic.     Right Ear: Tympanic membrane, ear canal and external ear normal. Decreased hearing noted.     Left Ear: Tympanic membrane, ear canal and external ear normal. Decreased hearing noted.  Neck:     Thyroid: No thyromegaly.     Vascular: No JVD.     Trachea: No tracheal deviation.  Cardiovascular:     Rate and Rhythm: Normal rate. Rhythm irregular.     Heart sounds: Normal heart sounds. No murmur heard.    No friction rub. No gallop.  Pulmonary:     Effort: Pulmonary effort is normal. No respiratory distress.     Breath sounds: Normal breath sounds. No stridor. No wheezing or rales.  Chest:     Chest wall: No tenderness.  Skin:    Findings: No rash.  Neurological:     General: No focal deficit present.     Mental Status: He is alert and oriented to person, place, and time. Mental status is at baseline.     Cranial Nerves: No cranial nerve deficit.     Sensory: No sensory deficit.     Motor: No abnormal muscle tone.     Coordination: Coordination normal.     Deep Tendon Reflexes: Reflexes are normal and symmetric.          Assessment & Plan:  Presbycusis of both ears Reassured the patient that I do not see any cerumen impaction in either ear.  I believe the fullness and hearing loss in his right ear is more likely due to presbycusis.  Patient is reassured.

## 2022-04-11 ENCOUNTER — Ambulatory Visit (INDEPENDENT_AMBULATORY_CARE_PROVIDER_SITE_OTHER): Payer: Medicare Other | Admitting: *Deleted

## 2022-04-11 DIAGNOSIS — Z5181 Encounter for therapeutic drug level monitoring: Secondary | ICD-10-CM

## 2022-04-11 DIAGNOSIS — I4891 Unspecified atrial fibrillation: Secondary | ICD-10-CM | POA: Diagnosis not present

## 2022-04-11 LAB — POCT INR: INR: 2 (ref 2.0–3.0)

## 2022-04-11 NOTE — Patient Instructions (Signed)
Continue warfarin 1 1/2 tablets daily except 1 tablet on Mondays, Wednesdays and Fridays Continue greens Recheck in 4 wks  

## 2022-05-09 ENCOUNTER — Ambulatory Visit (INDEPENDENT_AMBULATORY_CARE_PROVIDER_SITE_OTHER): Payer: Medicare Other | Admitting: *Deleted

## 2022-05-09 DIAGNOSIS — Z5181 Encounter for therapeutic drug level monitoring: Secondary | ICD-10-CM | POA: Diagnosis not present

## 2022-05-09 DIAGNOSIS — I4891 Unspecified atrial fibrillation: Secondary | ICD-10-CM

## 2022-05-09 LAB — POCT INR: INR: 2.2 (ref 2.0–3.0)

## 2022-05-09 NOTE — Patient Instructions (Signed)
Continue warfarin 1 1/2 tablets daily except 1 tablet on Mondays, Wednesdays and Fridays Continue greens Recheck in 5 wks

## 2022-05-14 ENCOUNTER — Ambulatory Visit: Payer: Medicare Other | Admitting: Urology

## 2022-05-14 DIAGNOSIS — N138 Other obstructive and reflux uropathy: Secondary | ICD-10-CM

## 2022-06-12 ENCOUNTER — Ambulatory Visit: Payer: Medicare Other | Admitting: Physician Assistant

## 2022-06-13 ENCOUNTER — Ambulatory Visit: Payer: Medicare Other | Attending: Internal Medicine | Admitting: *Deleted

## 2022-06-13 DIAGNOSIS — Z5181 Encounter for therapeutic drug level monitoring: Secondary | ICD-10-CM | POA: Diagnosis not present

## 2022-06-13 DIAGNOSIS — I4891 Unspecified atrial fibrillation: Secondary | ICD-10-CM

## 2022-06-13 LAB — POCT INR: INR: 2.3 (ref 2.0–3.0)

## 2022-06-13 NOTE — Patient Instructions (Signed)
Continue warfarin 1 1/2 tablets daily except 1 tablet on Mondays, Wednesdays and Fridays Continue greens Recheck in 6 wks

## 2022-06-21 DIAGNOSIS — D225 Melanocytic nevi of trunk: Secondary | ICD-10-CM | POA: Diagnosis not present

## 2022-06-21 DIAGNOSIS — Z85820 Personal history of malignant melanoma of skin: Secondary | ICD-10-CM | POA: Diagnosis not present

## 2022-06-21 DIAGNOSIS — Z08 Encounter for follow-up examination after completed treatment for malignant neoplasm: Secondary | ICD-10-CM | POA: Diagnosis not present

## 2022-06-21 DIAGNOSIS — L821 Other seborrheic keratosis: Secondary | ICD-10-CM | POA: Diagnosis not present

## 2022-07-12 DIAGNOSIS — X32XXXD Exposure to sunlight, subsequent encounter: Secondary | ICD-10-CM | POA: Diagnosis not present

## 2022-07-12 DIAGNOSIS — L570 Actinic keratosis: Secondary | ICD-10-CM | POA: Diagnosis not present

## 2022-07-12 DIAGNOSIS — L820 Inflamed seborrheic keratosis: Secondary | ICD-10-CM | POA: Diagnosis not present

## 2022-07-25 ENCOUNTER — Ambulatory Visit: Payer: Medicare Other | Attending: Internal Medicine | Admitting: *Deleted

## 2022-07-25 DIAGNOSIS — I4891 Unspecified atrial fibrillation: Secondary | ICD-10-CM

## 2022-07-25 DIAGNOSIS — Z5181 Encounter for therapeutic drug level monitoring: Secondary | ICD-10-CM | POA: Diagnosis not present

## 2022-07-25 LAB — POCT INR: INR: 1.5 — AB (ref 2.0–3.0)

## 2022-07-25 NOTE — Patient Instructions (Signed)
Take warfarin 2 tablets tonight and tomorrow night then increase dose to 1 1/2 tablets daily except 1 tablet on Mondays Continue greens Recheck in 4 wks per pt request

## 2022-08-03 ENCOUNTER — Other Ambulatory Visit: Payer: Self-pay | Admitting: Family Medicine

## 2022-08-06 NOTE — Telephone Encounter (Signed)
Unable to refill per protocol, Rx request is too soon. Last refill 10/26/21 for 180 and 3 RF.E-Prescribing Status: Receipt confirmed by pharmacy (10/26/2021 11:57 AM EST). Will refuse.  Requested Prescriptions  Pending Prescriptions Disp Refills   diltiazem (CARDIZEM) 30 MG tablet [Pharmacy Med Name: DILTIAZEM  '30MG'$   TAB] 200 tablet 2    Sig: TAKE 1 TABLET BY MOUTH DAILY AND PER DR.TAYLOR MAY TAKE 1  ADDITIONAL TABLET DAILY AS  NEEDED     Cardiovascular: Calcium Channel Blockers 3 Failed - 08/03/2022 10:02 PM      Failed - Cr in normal range and within 360 days    Creat  Date Value Ref Range Status  02/17/2021 1.17 0.70 - 1.18 mg/dL Final    Comment:    For patients >53 years of age, the reference limit for Creatinine is approximately 13% higher for people identified as African-American. .    Creatinine, Ser  Date Value Ref Range Status  12/19/2021 1.27 (H) 0.61 - 1.24 mg/dL Final         Failed - Valid encounter within last 6 months    Recent Outpatient Visits           7 months ago Bladder mass   [place] Susy Frizzle, MD   9 months ago Bilateral hearing loss due to cerumen impaction   [place] Susy Frizzle, MD   9 months ago Chronic pain of both [place] Susy Frizzle, MD   1 year ago Atrial fibrillation, unspecified type [facility])   [place] Susy Frizzle, MD   2 years ago Venous stasis dermatitis of both lower extremities   [place] Pickard, Cammie Mcgee, MD              Passed - ALT in normal range and within 360 days    ALT  Date Value Ref Range Status  12/19/2021 24 0 - 44 U/L Final         Passed - AST in normal range and within 360 days    AST  Date Value Ref Range Status  12/19/2021 17 15 - 41 U/L Final         Passed - Last BP in normal range    BP Readings from Last 1 Encounters:  04/02/22 98/62         Passed - Last Heart Rate in  normal range    Pulse Readings from Last 1 Encounters:  04/02/22 98

## 2022-08-08 ENCOUNTER — Ambulatory Visit: Payer: Medicare Other | Attending: Internal Medicine

## 2022-08-08 DIAGNOSIS — I495 Sick sinus syndrome: Secondary | ICD-10-CM | POA: Diagnosis not present

## 2022-08-08 LAB — CUP PACEART INCLINIC DEVICE CHECK
Battery Remaining Longevity: 163 mo
Battery Voltage: 3.05 V
Brady Statistic RV Percent Paced: 17.36 %
Date Time Interrogation Session: 20231115102016
Implantable Lead Connection Status: 753985
Implantable Lead Implant Date: 20020722
Implantable Lead Location: 753860
Implantable Lead Model: 5092
Implantable Pulse Generator Implant Date: 20220131
Lead Channel Impedance Value: 475 Ohm
Lead Channel Impedance Value: 532 Ohm
Lead Channel Pacing Threshold Amplitude: 0.875 V
Lead Channel Pacing Threshold Pulse Width: 0.4 ms
Lead Channel Sensing Intrinsic Amplitude: 7.125 mV
Lead Channel Sensing Intrinsic Amplitude: 8.625 mV
Lead Channel Setting Pacing Amplitude: 2.5 V
Lead Channel Setting Pacing Pulse Width: 0.4 ms
Lead Channel Setting Sensing Sensitivity: 0.9 mV
Zone Setting Status: 755011

## 2022-08-08 NOTE — Patient Instructions (Signed)
Thank you for coming in today.   No changes, normal device function.  Please continue to keep your every  47monthappointments with the device clinic.  I will have our scheduler reach out to you to set up your next device clinic check.   Have a wonderful day.

## 2022-08-08 NOTE — Progress Notes (Signed)
Pacemaker check in clinic. Normal device function. Thresholds, sensing, impedances consistent with previous measurements. Device programmed to maximize longevity. Device recorded 16 short episodes <2 secs of what appears to be SVT.  Patient does have AF and is on warfarin daily.  Device programmed at appropriate safety margins. Histogram distribution appropriate for patient activity level. Device programmed to optimize intrinsic conduction. Estimated longevity 13.6 years on battery. Patient does not do remote follow up and will be seen back in device clinic in 6 months. Patient education completed, he is due to follow up with Dr. Lovena Le in May.

## 2022-08-22 ENCOUNTER — Ambulatory Visit: Payer: Medicare Other | Attending: Internal Medicine | Admitting: *Deleted

## 2022-08-22 DIAGNOSIS — Z5181 Encounter for therapeutic drug level monitoring: Secondary | ICD-10-CM

## 2022-08-22 DIAGNOSIS — I4891 Unspecified atrial fibrillation: Secondary | ICD-10-CM

## 2022-08-22 LAB — POCT INR: INR: 2.9 (ref 2.0–3.0)

## 2022-08-22 NOTE — Patient Instructions (Signed)
Continue warfarin 1 1/2 tablets daily except 1 tablet on Mondays Continue greens Recheck in 5 wks

## 2022-08-29 ENCOUNTER — Telehealth: Payer: Self-pay | Admitting: Family Medicine

## 2022-08-29 NOTE — Telephone Encounter (Signed)
Patient declined the Medicare Wellness Visit with [facility]  Stated he has no desire to answer questions with anyone but his PCP.

## 2022-09-23 ENCOUNTER — Other Ambulatory Visit: Payer: Self-pay | Admitting: Family Medicine

## 2022-09-25 NOTE — Telephone Encounter (Signed)
Requested medication (s) are due for refill today: Yes  Requested medication (s) are on the active medication list: Yes  Last refill:  12/27/21  Future visit scheduled: No  Notes to clinic:  See request.    Requested Prescriptions  Pending Prescriptions Disp Refills   warfarin (COUMADIN) 5 MG tablet [Pharmacy Med Name: Warfarin Sodium 5 MG Oral Tablet] 200 tablet 2    Sig: TAKE 1 AND 1/2 TO 2 TABLETS BY MOUTH DAILY OR AS  DIRECTED     Hematology:  Anticoagulants - warfarin Failed - 09/23/2022 11:05 PM      Failed - Manual Review: If patient's warfarin is managed by Anti-Coag team, route request to them. If not, route request to the provider.      Failed - INR in normal range and within 30 days    INR  Date Value Ref Range Status  08/22/2022 2.9 2.0 - 3.0 Final  12/19/2021 2.2 (H) 0.8 - 1.2 Final    Comment:    (NOTE) INR goal varies based on device and disease states. Performed at [facility], [address] [place], [place] [address]    INR, fingerstick  Date Value Ref Range Status  06/21/2017 2.3 (H) ratio Final    Comment:    INRs >2.9 may be falsely elevated in patients receiving either unfractionated Heparin or Low Molecular Weight Heparin. Follow up testing in a hospital laboratory may be helpful if clinically indicated.  . INR results of >5.0 should be verified using the  standard venipuncture procedure. Reference Range                     0.9-1.1 Moderate-intensity Warfarin Therapy 2.0-3.0 Higher-intensity Warfarin Therapy   3.0-4.0  .          Failed - Valid encounter within last 3 months    Recent Outpatient Visits           9 months ago Bladder mass   [place] Susy Frizzle, MD   10 months ago Bilateral hearing loss due to cerumen impaction   [place] Susy Frizzle, MD   11 months ago Chronic pain of both knees   [place] Pickard, Cammie Mcgee, MD   1 year ago Atrial fibrillation,  unspecified type [facility])   [place] Susy Frizzle, MD   2 years ago Venous stasis dermatitis of both lower extremities   [place] Pickard, Cammie Mcgee, MD              Passed - HCT in normal range and within 360 days    HCT  Date Value Ref Range Status  12/19/2021 44.3 39.0 - 52.0 % Final         Passed - Patient is not pregnant

## 2022-09-26 ENCOUNTER — Ambulatory Visit: Payer: Medicare Other | Attending: Internal Medicine | Admitting: *Deleted

## 2022-09-26 DIAGNOSIS — I4891 Unspecified atrial fibrillation: Secondary | ICD-10-CM | POA: Diagnosis not present

## 2022-09-26 DIAGNOSIS — Z5181 Encounter for therapeutic drug level monitoring: Secondary | ICD-10-CM | POA: Diagnosis not present

## 2022-09-26 LAB — POCT INR: INR: 2.2 (ref 2.0–3.0)

## 2022-09-26 NOTE — Patient Instructions (Signed)
Continue warfarin 1 1/2 tablets daily except 1 tablet on Mondays Continue greens Recheck in 6 wks  

## 2022-09-29 ENCOUNTER — Other Ambulatory Visit: Payer: Self-pay | Admitting: Family Medicine

## 2022-10-01 NOTE — Telephone Encounter (Signed)
Requested Prescriptions  Pending Prescriptions Disp Refills   diltiazem (CARDIZEM) 30 MG tablet [Pharmacy Med Name: DILTIAZEM  '30MG'$   TAB] 180 tablet 0    Sig: TAKE 1 TABLET BY MOUTH DAILY AND PER DR.TAYLOR MAY TAKE 1  ADDITIONAL TABLET DAILY AS  NEEDED     Cardiovascular: Calcium Channel Blockers 3 Failed - 09/29/2022 10:41 PM      Failed - Cr in normal range and within 360 days    Creat  Date Value Ref Range Status  02/17/2021 1.17 0.70 - 1.18 mg/dL Final    Comment:    For patients >37 years of age, the reference limit for Creatinine is approximately 13% higher for people identified as African-American. .    Creatinine, Ser  Date Value Ref Range Status  12/19/2021 1.27 (H) 0.61 - 1.24 mg/dL Final         Failed - Valid encounter within last 6 months    Recent Outpatient Visits           9 months ago Bladder mass   [place] Susy Frizzle, MD   10 months ago Bilateral hearing loss due to cerumen impaction   [place] Susy Frizzle, MD   11 months ago Chronic pain of both [place] Susy Frizzle, MD   1 year ago Atrial fibrillation, unspecified type [facility])   [place] Susy Frizzle, MD   2 years ago Venous stasis dermatitis of both lower extremities   [place] Pickard, Cammie Mcgee, MD              Passed - ALT in normal range and within 360 days    ALT  Date Value Ref Range Status  12/19/2021 24 0 - 44 U/L Final         Passed - AST in normal range and within 360 days    AST  Date Value Ref Range Status  12/19/2021 17 15 - 41 U/L Final         Passed - Last BP in normal range    BP Readings from Last 1 Encounters:  04/02/22 98/62         Passed - Last Heart Rate in normal range    Pulse Readings from Last 1 Encounters:  04/02/22 98

## 2022-10-12 ENCOUNTER — Other Ambulatory Visit: Payer: Self-pay | Admitting: Family Medicine

## 2022-10-15 NOTE — Telephone Encounter (Signed)
Requested medications are due for refill today.  Provider to determine  Requested medications are on the active medications list.  yes  Last refill. 09/25/2022 #60 0 rf  Future visit scheduled.   no  Notes to clinic.  Refill not delegated.    Requested Prescriptions  Pending Prescriptions Disp Refills   warfarin (COUMADIN) 5 MG tablet [Pharmacy Med Name: Warfarin Sodium 5 MG Oral Tablet] 60 tablet 11    Sig: TAKE 1 AND 1/2 TO 2 TABLETS BY  MOUTH DAILY OR AS DIRECTED     Hematology:  Anticoagulants - warfarin Failed - 10/12/2022 11:15 PM      Failed - Manual Review: If patient's warfarin is managed by Anti-Coag team, route request to them. If not, route request to the provider.      Failed - Valid encounter within last 3 months    Recent Outpatient Visits           9 months ago Bladder mass   [place] Susy Frizzle, MD   11 months ago Bilateral hearing loss due to cerumen impaction   [place] Dennard Schaumann Cammie Mcgee, MD   11 months ago Chronic pain of both knees   [place] Pickard, Cammie Mcgee, MD   1 year ago Atrial fibrillation, unspecified type [facility])   [place] Susy Frizzle, MD   2 years ago Venous stasis dermatitis of both lower extremities   [place], Cammie Mcgee, MD              Passed - INR in normal range and within 30 days    INR  Date Value Ref Range Status  09/26/2022 2.2 2.0 - 3.0 Final  12/19/2021 2.2 (H) 0.8 - 1.2 Final    Comment:    (NOTE) INR goal varies based on device and disease states. Performed at [facility], [address] [place], [place] [address]    INR, fingerstick  Date Value Ref Range Status  06/21/2017 2.3 (H) ratio Final    Comment:    INRs >2.9 may be falsely elevated in patients receiving either unfractionated Heparin or Low Molecular Weight Heparin. Follow up testing in a hospital laboratory may be helpful if clinically  indicated.  . INR results of >5.0 should be verified using the  standard venipuncture procedure. Reference Range                     0.9-1.1 Moderate-intensity Warfarin Therapy 2.0-3.0 Higher-intensity Warfarin Therapy   3.0-4.0  .          Passed - HCT in normal range and within 360 days    HCT  Date Value Ref Range Status  12/19/2021 44.3 39.0 - 52.0 % Final         Passed - Patient is not pregnant

## 2022-10-15 NOTE — Telephone Encounter (Signed)
Pls advice  

## 2022-10-24 ENCOUNTER — Other Ambulatory Visit: Payer: Self-pay | Admitting: Family Medicine

## 2022-10-25 NOTE — Telephone Encounter (Signed)
Requested Prescriptions  Pending Prescriptions Disp Refills   metoprolol succinate (TOPROL-XL) 25 MG 24 hr tablet [Pharmacy Med Name: Metoprolol Succinate ER 25 MG Oral Tablet Extended Release 24 Hour] 45 tablet 2    Sig: TAKE ONE-HALF TABLET BY MOUTH  DAILY     Cardiovascular:  Beta Blockers Failed - 10/24/2022 11:09 PM      Failed - Valid encounter within last 6 months    Recent Outpatient Visits           10 months ago Bladder mass   [place] Susy Frizzle, MD   11 months ago Bilateral hearing loss due to cerumen impaction   [place] Susy Frizzle, MD   12 months ago Chronic pain of both knees   [place] Pickard, Cammie Mcgee, MD   1 year ago Atrial fibrillation, unspecified type [facility])   [place] Susy Frizzle, MD   2 years ago Venous stasis dermatitis of both lower extremities   [place] Pickard, Cammie Mcgee, MD              Passed - Last BP in normal range    BP Readings from Last 1 Encounters:  04/02/22 98/62         Passed - Last Heart Rate in normal range    Pulse Readings from Last 1 Encounters:  04/02/22 98

## 2022-11-07 ENCOUNTER — Ambulatory Visit: Payer: Medicare Other | Attending: Internal Medicine | Admitting: *Deleted

## 2022-11-07 DIAGNOSIS — Z5181 Encounter for therapeutic drug level monitoring: Secondary | ICD-10-CM

## 2022-11-07 DIAGNOSIS — I4891 Unspecified atrial fibrillation: Secondary | ICD-10-CM

## 2022-11-07 LAB — POCT INR: INR: 2.7 (ref 2.0–3.0)

## 2022-11-07 NOTE — Patient Instructions (Signed)
Continue warfarin 1 1/2 tablets daily except 1 tablet on Mondays Continue greens Recheck in 6 wks

## 2022-12-08 ENCOUNTER — Other Ambulatory Visit: Payer: Self-pay | Admitting: Family Medicine

## 2022-12-19 ENCOUNTER — Ambulatory Visit: Payer: Medicare Other | Attending: Internal Medicine | Admitting: *Deleted

## 2022-12-19 DIAGNOSIS — I4891 Unspecified atrial fibrillation: Secondary | ICD-10-CM | POA: Diagnosis not present

## 2022-12-19 DIAGNOSIS — Z5181 Encounter for therapeutic drug level monitoring: Secondary | ICD-10-CM | POA: Diagnosis not present

## 2022-12-19 LAB — POCT INR: INR: 2 (ref 2.0–3.0)

## 2022-12-19 NOTE — Patient Instructions (Addendum)
Continue warfarin 1 1/2 tablets daily except 1 tablet on Mondays Continue greens Recheck in 6 wks  

## 2023-01-07 DIAGNOSIS — L570 Actinic keratosis: Secondary | ICD-10-CM | POA: Diagnosis not present

## 2023-01-07 DIAGNOSIS — B356 Tinea cruris: Secondary | ICD-10-CM | POA: Diagnosis not present

## 2023-02-06 ENCOUNTER — Ambulatory Visit: Payer: Medicare Other

## 2023-02-07 ENCOUNTER — Ambulatory Visit: Payer: Medicare Other | Attending: Internal Medicine | Admitting: *Deleted

## 2023-02-07 DIAGNOSIS — Z5181 Encounter for therapeutic drug level monitoring: Secondary | ICD-10-CM | POA: Diagnosis not present

## 2023-02-07 DIAGNOSIS — I4891 Unspecified atrial fibrillation: Secondary | ICD-10-CM | POA: Diagnosis not present

## 2023-02-07 LAB — POCT INR: POC INR: 4.5

## 2023-02-07 NOTE — Patient Instructions (Signed)
Description    Hold warfarin 4/17 and 4/18, then continue warfarin 1 1/2 tablets daily except 1 tablet on Mondays Continue greens Recheck in 2 wks

## 2023-02-13 ENCOUNTER — Encounter: Payer: Self-pay | Admitting: Internal Medicine

## 2023-02-13 ENCOUNTER — Ambulatory Visit: Payer: Medicare Other | Attending: Internal Medicine | Admitting: Internal Medicine

## 2023-02-13 VITALS — BP 120/80 | HR 86 | Ht 70.0 in | Wt 236.0 lb

## 2023-02-13 DIAGNOSIS — I4819 Other persistent atrial fibrillation: Secondary | ICD-10-CM

## 2023-02-13 DIAGNOSIS — I495 Sick sinus syndrome: Secondary | ICD-10-CM

## 2023-02-13 LAB — CUP PACEART INCLINIC DEVICE CHECK
Date Time Interrogation Session: 20240522102643
Implantable Lead Connection Status: 753985
Implantable Lead Implant Date: 20020722
Implantable Lead Location: 753860
Implantable Lead Model: 5092
Implantable Pulse Generator Implant Date: 20220131

## 2023-02-13 MED ORDER — METOPROLOL SUCCINATE ER 25 MG PO TB24: 25.0000 mg | ORAL_TABLET | Freq: Every day | ORAL | 3 refills | Status: DC

## 2023-02-13 NOTE — Addendum Note (Signed)
Addended by: Leonides Schanz C on: 02/13/2023 11:46 AM   Modules accepted: Orders

## 2023-02-13 NOTE — Progress Notes (Signed)
HPI Kevin Vazquez returns today for ongoing evaluation of chronic atrial fibrillation, intermitent complete heart block, status post pacemaker insertion, and hypertension.  The patient has done well in the interim. He notes that when he is doing something strenuous, his heart will speed up and he will feel the palpitations. However, this has improved some since his toprol was started.  He has not had syncope.  He denies chest pain.  He has class IIa dyspnea.  No peripheral edema.  He remains active maintaining rental houses. He has been saddened by his oldest son being diagnosed with brain CA. No Known Allergies   Current Outpatient Medications  Medication Sig Dispense Refill   Aromatic Inhalants (VICKS VAPOR INHALER IN) Inhale into the lungs.     CRANBERRY PO Take 1 tablet by mouth daily.     diltiazem (CARDIZEM) 30 MG tablet TAKE 1 TABLET BY MOUTH DAILY AND MAY TAKE 1 ADDITIONAL TABLET  DAILY AS NEEDED 180 tablet 3   metoprolol succinate (TOPROL XL) 25 MG 24 hr tablet Take 1 tablet (25 mg total) by mouth daily. 90 tablet 3   psyllium (EQ FIBER THERAPY) 0.52 g capsule Take 0.52 g by mouth daily.     triamcinolone (KENALOG) 0.1 % Apply to affected area qd-bid 454 g 2   triamcinolone cream (KENALOG) 0.1 % Apply 1 application topically daily as needed. 453 g 3   warfarin (COUMADIN) 5 MG tablet TAKE 1 AND 1/2 TO 2 TABLETS BY  MOUTH DAILY OR AS DIRECTED 60 tablet 11   No current facility-administered medications for this visit.     Past Medical History:  Diagnosis Date   Atrial fibrillation, permanent (HCC)    Basal cell carcinoma 04/02/2017   Left upper lip - CX3 + cautery + 5FU   BPH (benign prostatic hyperplasia)    Melanoma (HCC) 08/31/2004   Level III - upper right back shoulder (Dr. Lenis Noon)   Presence of permanent cardiac pacemaker    Sick sinus syndrome (HCC)    Squamous cell carcinoma of skin 04/02/2017   Left temple - CX3 + cautery + 5FU   Squamous cell carcinoma of skin  04/11/2018   Left forearm - CX3 + cautery + 5FU   Squamous cell carcinoma of skin 07/19/20019   Left forehead - CX3 + cautery + 5FU   Squamous cell carcinoma of skin 09/16/2019   Left inner arm - tx p bx    ROS:   All systems reviewed and negative except as noted in the HPI.   Past Surgical History:  Procedure Laterality Date   CARDIAC CATHETERIZATION  01/26/1997   Normal LV function. Normal coronary arteries.   CARDIOVASCULAR STRESS TEST  05/02/2011   Normal perfusion scan demonstrating diaphragmatic artifact. No significant ischemia demonstrated    LEFT LOWER EXTREMITY VENOUS DOPPLER  02/07/2009   3.8x1.3x2.8cm hypoechoic area in lateral L thigh. No evidence of DVT.   PACEMAKER INSERTION  04/24/2010   Medtronic Adapta, model #ADSRO1, serial #[id number]   PPM GENERATOR CHANGEOUT N/A 10/24/2020   Procedure: PPM GENERATOR CHANGEOUT;  Surgeon: Marinus Maw, MD;  [facility]: [facility] INVASIVE CV LAB;  Service: Cardiovascular;  Laterality: N/A;   TRANSTHORACIC ECHOCARDIOGRAM  11/26/2012   EF 50-55%, LA-appendage was moderately dilated     Family History  Problem Relation Age of Onset   Heart disease Other      Social History   Socioeconomic History   Marital status: Married    Spouse name: Not on  file   Number of children: Not on file   Years of education: Not on file   Highest education level: Not on file  Occupational History   Occupation: Curator Retired  Tobacco Use   Smoking status: Never   Smokeless tobacco: Former    Types: Associate Professor Use: Never used  Substance and Sexual Activity   Alcohol use: Yes    Alcohol/week: 7.0 standard drinks of alcohol    Types: 7 Standard drinks or equivalent per week   Drug use: No   Sexual activity: Not on file  Other Topics Concern   Not on file  Social History Narrative   Not on file   Social Determinants of Health   Financial Resource Strain: Not on file  Food Insecurity: Not on file  Transportation Needs: Not on  file  Physical Activity: Not on file  Stress: Not on file  Social Connections: Not on file  Intimate Partner Violence: Not on file     BP 120/80 (BP Location: Left Arm, Patient Position: Sitting, Cuff Size: Normal)   Pulse 86   Ht 5\' 10"  (1.778 m)   Wt 236 lb (107 kg)   SpO2 97%   BMI 33.86 kg/m   Physical Exam:  Well appearing NAD HEENT: Unremarkable Neck:  No JVD, no thyromegally Lymphatics:  No adenopathy Back:  No CVA tenderness Lungs:  Clear with no wheezes HEART:  IRegular rate rhythm, no murmurs, no rubs, no clicks Abd:  soft, positive bowel sounds, no organomegally, no rebound, no guarding Ext:  2 plus pulses, no edema, no cyanosis, no clubbing Skin:  No rashes no nodules Neuro:  CN II through XII intact, motor grossly intact  EKG - atrial fib with a controlled VR  DEVICE  Normal device function.  See PaceArt for details.   Assess/Plan: 1. Atrial fib - his rates are fairly well controlled. I asked him to take an extra cardizem as needed for increased HR's. He is tolerating his toprol.   2. Coags - he will continue his warfarin   3. PPM - his medtronic device is working normally. We will recheck  in the office in our device clinic in several months. 4. Obesity - he is encouraged to lose weight.    Kevin Gowda Angelle Isais,MD

## 2023-02-13 NOTE — Patient Instructions (Signed)
Medication Instructions:  Your physician recommends that you continue on your current medications as directed. Please refer to the Current Medication list given to you today.  *If you need a refill on your cardiac medications before your next appointment, please call your pharmacy*   Lab Work: NONE   If you have labs (blood work) drawn today and your tests are completely normal, you will receive your results only by: MyChart Message (if you have MyChart) OR A paper copy in the mail If you have any lab test that is abnormal or we need to change your treatment, we will call you to review the results.   Testing/Procedures: NONE    Follow-Up: At [facility], you and your health needs are our priority.  As part of our continuing mission to provide you with exceptional heart care, we have created designated Provider Care Teams.  These Care Teams include your primary Cardiologist (physician) and Advanced Practice Providers (APPs -  Physician Assistants and Nurse Practitioners) who all work together to provide you with the care you need, when you need it.  We recommend signing up for the patient portal called "MyChart".  Sign up information is provided on this After Visit Summary.  MyChart is used to connect with patients for Virtual Visits (Telemedicine).  Patients are able to view lab/test results, encounter notes, upcoming appointments, etc.  Non-urgent messages can be sent to your provider as well.   To learn more about what you can do with MyChart, go to [email or web address].    Your next appointment:   1 year(s)  Provider:   Gregg Taylor, MD    Other Instructions Thank you for choosing [facility]!    

## 2023-02-20 ENCOUNTER — Encounter: Payer: Self-pay | Admitting: Family Medicine

## 2023-02-20 ENCOUNTER — Ambulatory Visit (INDEPENDENT_AMBULATORY_CARE_PROVIDER_SITE_OTHER): Payer: Medicare Other | Admitting: Family Medicine

## 2023-02-20 VITALS — BP 120/62 | HR 49 | Temp 97.4°F | Ht 70.0 in | Wt 233.0 lb

## 2023-02-20 DIAGNOSIS — I4891 Unspecified atrial fibrillation: Secondary | ICD-10-CM | POA: Diagnosis not present

## 2023-02-20 DIAGNOSIS — R300 Dysuria: Secondary | ICD-10-CM | POA: Diagnosis not present

## 2023-02-20 DIAGNOSIS — B9689 Other specified bacterial agents as the cause of diseases classified elsewhere: Secondary | ICD-10-CM

## 2023-02-20 DIAGNOSIS — J019 Acute sinusitis, unspecified: Secondary | ICD-10-CM

## 2023-02-20 DIAGNOSIS — N3289 Other specified disorders of bladder: Secondary | ICD-10-CM | POA: Diagnosis not present

## 2023-02-20 LAB — URINALYSIS, ROUTINE W REFLEX MICROSCOPIC
Bacteria, UA: NONE SEEN /HPF
Bilirubin Urine: NEGATIVE
Glucose, UA: NEGATIVE
Hgb urine dipstick: NEGATIVE
Ketones, ur: NEGATIVE
Leukocytes,Ua: NEGATIVE
Nitrite: NEGATIVE
Specific Gravity, Urine: 1.025 (ref 1.001–1.035)
pH: 6 (ref 5.0–8.0)

## 2023-02-20 LAB — PT WITH INR/FINGERSTICK
INR, fingerstick: 5.1 ratio — ABNORMAL HIGH
PT, fingerstick: 61.1 s — ABNORMAL HIGH (ref 10.5–13.1)

## 2023-02-20 LAB — MICROSCOPIC MESSAGE

## 2023-02-20 MED ORDER — AMOXICILLIN-POT CLAVULANATE 875-125 MG PO TABS
1.0000 | ORAL_TABLET | Freq: Two times a day (BID) | ORAL | 0 refills | Status: DC
Start: 2023-02-20 — End: 2024-07-28

## 2023-02-20 NOTE — Progress Notes (Signed)
Subjective:    Patient ID: Kevin Vazquez, male    DOB: Dec 03, 1945, 77 y.o.   MRN: [id number]  Patient is a very pleasant 77 year old Caucasian gentleman who presents today complaining of fevers and chills.  He states has been having it for about a week.  He was concerned that he may have a bladder infection.  Urinalysis shows no nitrates, no leukocyte esterase, no blood.  He denies dysuria urgency frequency.  He does have his BPH.  Denies any pain in his rectum or low back pain.  However he does report a sinus headache in his frontal sinus that has been present for more than a month.  He states that his head has been stopped up for over a month with postnasal drip rhinorrhea and head congestion.  Today on examination, he has tenderness to percussion over his right frontal sinus and right maxillary sinus.  The remainder of his exam however is normal.  We did check an INR today and is INR is 5.1.  He is currently taking 7.5 mg of Coumadin every day and 5 mg on Monday  Past Medical History:  Diagnosis Date   Atrial fibrillation, permanent (HCC)    Basal cell carcinoma 04/02/2017   Left upper lip - CX3 + cautery + 5FU   BPH (benign prostatic hyperplasia)    Melanoma (HCC) 08/31/2004   Level III - upper right back shoulder (Dr. Lenis Noon)   Presence of permanent cardiac pacemaker    Sick sinus syndrome (HCC)    Squamous cell carcinoma of skin 04/02/2017   Left temple - CX3 + cautery + 5FU   Squamous cell carcinoma of skin 04/11/2018   Left forearm - CX3 + cautery + 5FU   Squamous cell carcinoma of skin 07/19/20019   Left forehead - CX3 + cautery + 5FU   Squamous cell carcinoma of skin 09/16/2019   Left inner arm - tx p bx   Past Surgical History:  Procedure Laterality Date   CARDIAC CATHETERIZATION  01/26/1997   Normal LV function. Normal coronary arteries.   CARDIOVASCULAR STRESS TEST  05/02/2011   Normal perfusion scan demonstrating diaphragmatic artifact. No significant ischemia demonstrated     LEFT LOWER EXTREMITY VENOUS DOPPLER  02/07/2009   3.8x1.3x2.8cm hypoechoic area in lateral L thigh. No evidence of DVT.   PACEMAKER INSERTION  04/24/2010   Medtronic Adapta, model #ADSRO1, serial #[id number]   PPM GENERATOR CHANGEOUT N/A 10/24/2020   Procedure: PPM GENERATOR CHANGEOUT;  Surgeon: Marinus Maw, MD;  Location: [facility] INVASIVE CV LAB;  Service: Cardiovascular;  Laterality: N/A;   TRANSTHORACIC ECHOCARDIOGRAM  11/26/2012   EF 50-55%, LA-appendage was moderately dilated   Current Outpatient Medications on File Prior to Visit  Medication Sig Dispense Refill   Aromatic Inhalants (VICKS VAPOR INHALER IN) Inhale into the lungs.     CRANBERRY PO Take 1 tablet by mouth daily.     diltiazem (CARDIZEM) 30 MG tablet TAKE 1 TABLET BY MOUTH DAILY AND MAY TAKE 1 ADDITIONAL TABLET  DAILY AS NEEDED 180 tablet 3   metoprolol succinate (TOPROL XL) 25 MG 24 hr tablet Take 1 tablet (25 mg total) by mouth daily. 90 tablet 3   psyllium (EQ FIBER THERAPY) 0.52 g capsule Take 0.52 g by mouth daily.     triamcinolone (KENALOG) 0.1 % Apply to affected area qd-bid 454 g 2   triamcinolone cream (KENALOG) 0.1 % Apply 1 application topically daily as needed. 453 g 3   warfarin (COUMADIN) 5 MG  tablet TAKE 1 AND 1/2 TO 2 TABLETS BY  MOUTH DAILY OR AS DIRECTED 60 tablet 11   No current facility-administered medications on file prior to visit.   No Known Allergies Social History   Socioeconomic History   Marital status: Married    Spouse name: Not on file   Number of children: Not on file   Years of education: Not on file   Highest education level: Not on file  Occupational History   Occupation: Curator Retired  Tobacco Use   Smoking status: Never   Smokeless tobacco: Former    Types: Associate Professor Use: Never used  Substance and Sexual Activity   Alcohol use: Yes    Alcohol/week: 7.0 standard drinks of alcohol    Types: 7 Standard drinks or equivalent per week   Drug use: No   Sexual  activity: Not on file  Other Topics Concern   Not on file  Social History Narrative   Not on file   Social Determinants of Health   Financial Resource Strain: Not on file  Food Insecurity: Not on file  Transportation Needs: Not on file  Physical Activity: Not on file  Stress: Not on file  Social Connections: Not on file  Intimate Partner Violence: Not on file   Family History  Problem Relation Age of Onset   Heart disease Other       Review of Systems  Gastrointestinal:  Positive for abdominal pain.  All other systems reviewed and are negative.      Objective:   Physical Exam Vitals reviewed.  Constitutional:      General: He is not in acute distress.    Appearance: Normal appearance. He is well-developed. He is not ill-appearing, toxic-appearing or diaphoretic.  HENT:     Head: Normocephalic and atraumatic.     Right Ear: Tympanic membrane, ear canal and external ear normal.     Left Ear: External ear normal. A foreign body is present.     Nose: Congestion present. No rhinorrhea.     Right Sinus: Maxillary sinus tenderness and frontal sinus tenderness present.     Mouth/Throat:     Pharynx: No oropharyngeal exudate.  Eyes:     General: No scleral icterus.       Right eye: No discharge.        Left eye: No discharge.     Conjunctiva/sclera: Conjunctivae normal.     Pupils: Pupils are equal, round, and reactive to light.  Neck:     Thyroid: No thyromegaly.     Vascular: No carotid bruit or JVD.     Trachea: No tracheal deviation.  Cardiovascular:     Rate and Rhythm: Normal rate. Rhythm irregular.     Heart sounds: Normal heart sounds. No murmur heard.    No friction rub. No gallop.  Pulmonary:     Effort: Pulmonary effort is normal. No respiratory distress.     Breath sounds: Normal breath sounds. No stridor. No wheezing or rales.  Chest:     Chest wall: No tenderness.  Abdominal:     General: Bowel sounds are normal. There is no distension.      Palpations: Abdomen is soft. There is no mass.     Tenderness: There is no abdominal tenderness. There is no guarding or rebound.  Musculoskeletal:        General: No tenderness or deformity. Normal range of motion.     Cervical back: Normal range of motion  and neck supple.     Right lower leg: No edema.     Left lower leg: No edema.  Lymphadenopathy:     Cervical: No cervical adenopathy.  Skin:    General: Skin is warm.     Coloration: Skin is not jaundiced or pale.     Findings: No bruising, erythema, lesion or rash.  Neurological:     Mental Status: He is alert and oriented to person, place, and time.     Cranial Nerves: No cranial nerve deficit.     Motor: No abnormal muscle tone.     Coordination: Coordination normal.     Deep Tendon Reflexes: Reflexes are normal and symmetric.  Psychiatric:        Behavior: Behavior normal.        Thought Content: Thought content normal.        Judgment: Judgment normal.           Assessment & Plan:  Acute bacterial rhinosinusitis - Plan: Urinalysis, Routine w reflex microscopic  Dysuria - Plan: Urinalysis, Routine w reflex microscopic  Atrial fibrillation, unspecified type (HCC) - Plan: PT with INR/Fingerstick I believe the patient is dealing with a sinus infection.  Begin Augmentin 875 mg twice daily for 10 days.  No evidence of UTI on the urinalysis.  He denies any symptoms to suggest prostatitis.  The remainder of his exam is normal.  His INR is supratherapeutic.  Hold Coumadin for 48 hours and then reduce dose to 7 mg on Monday Wednesday Friday and 5 mg on Tuesday, Thursday, Saturday, Sunday.  Recheck INR in 2 weeks

## 2023-03-06 ENCOUNTER — Ambulatory Visit: Payer: Medicare Other | Attending: Internal Medicine | Admitting: *Deleted

## 2023-03-06 DIAGNOSIS — I4891 Unspecified atrial fibrillation: Secondary | ICD-10-CM | POA: Diagnosis not present

## 2023-03-06 DIAGNOSIS — Z5181 Encounter for therapeutic drug level monitoring: Secondary | ICD-10-CM | POA: Diagnosis not present

## 2023-03-06 LAB — POCT INR: INR: 2.5 (ref 2.0–3.0)

## 2023-03-06 NOTE — Patient Instructions (Signed)
Pt has been on warfarin 1 tablet daily x 2 weeks per Dr Tanya Nones. Continue warfarin 1 tablet daily Continue greens Recheck in 3 wks

## 2023-03-26 ENCOUNTER — Ambulatory Visit: Payer: Medicare Other | Attending: Internal Medicine | Admitting: *Deleted

## 2023-03-26 DIAGNOSIS — I4891 Unspecified atrial fibrillation: Secondary | ICD-10-CM

## 2023-03-26 DIAGNOSIS — Z5181 Encounter for therapeutic drug level monitoring: Secondary | ICD-10-CM | POA: Diagnosis not present

## 2023-03-26 LAB — POCT INR: INR: 2.7 (ref 2.0–3.0)

## 2023-03-26 NOTE — Patient Instructions (Signed)
Continue warfarin 1 tablet daily  Continue greens Recheck in 4 wks. 

## 2023-05-01 ENCOUNTER — Ambulatory Visit: Payer: Medicare Other | Attending: Internal Medicine | Admitting: *Deleted

## 2023-05-01 DIAGNOSIS — I4891 Unspecified atrial fibrillation: Secondary | ICD-10-CM | POA: Diagnosis not present

## 2023-05-01 DIAGNOSIS — Z5181 Encounter for therapeutic drug level monitoring: Secondary | ICD-10-CM

## 2023-05-01 LAB — POCT INR: INR: 2.5 (ref 2.0–3.0)

## 2023-05-01 NOTE — Patient Instructions (Signed)
Continue warfarin 1 tablet daily  Continue greens Recheck in 6 wks.

## 2023-05-22 IMAGING — CT CT ABD-PELV W/ CM
2 of 5 series · 16 of 46 positions shown, 18 images · IV contrast (agent unspecified)
Comparison: None.

CLINICAL DATA: Left lower quadrant abdominal pain beginning
yesterday.

EXAM:
CT ABDOMEN AND PELVIS WITH CONTRAST
TECHNIQUE: Multidetector CT imaging of the abdomen and pelvis was performed
using the standard protocol following bolus administration of
intravenous contrast.

[Series 2: axial st · axial · 0.91mm/px · z∈[+715,+1145]mm · 13 of 96 slices shown, 15 images]
[im 5/96  soft-tissue]
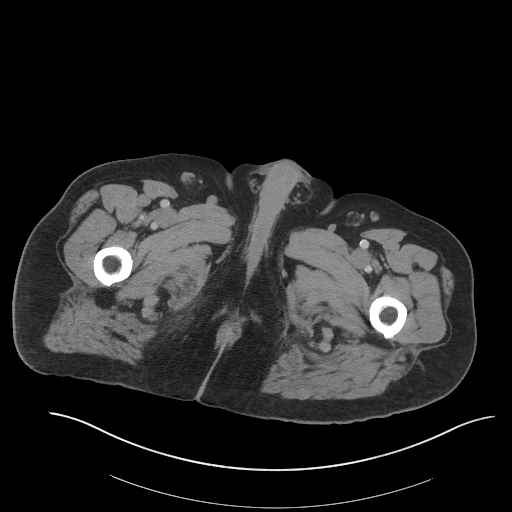
[im 5/96  bone]
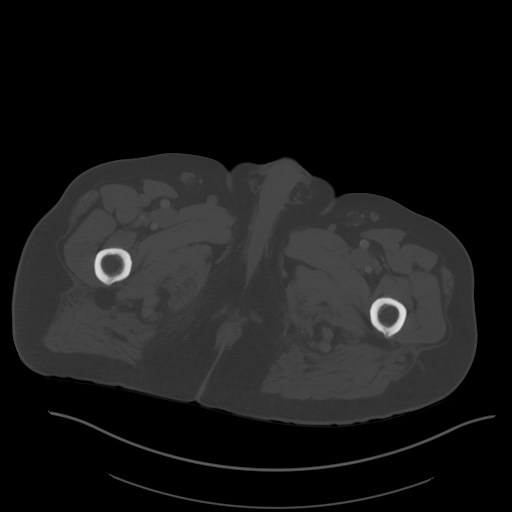
[im 15/96  soft-tissue]
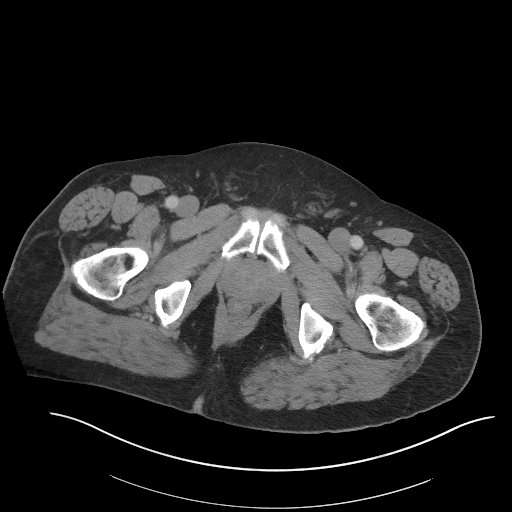
[im 20/96  soft-tissue]
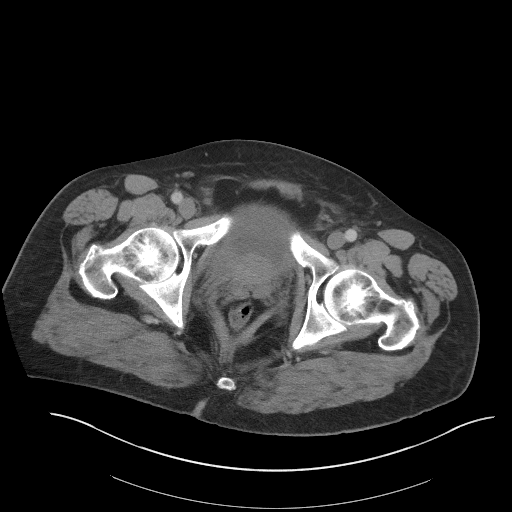
[im 29/96  soft-tissue]
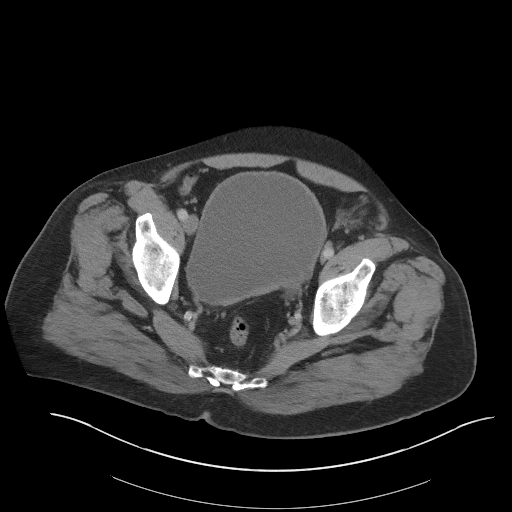
[im 34/96  soft-tissue]
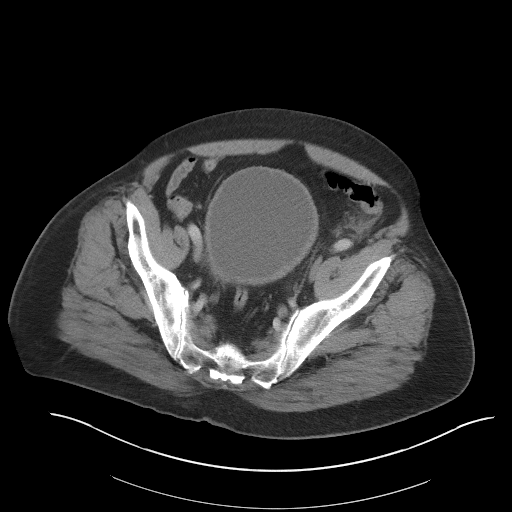
[im 43/96  soft-tissue]
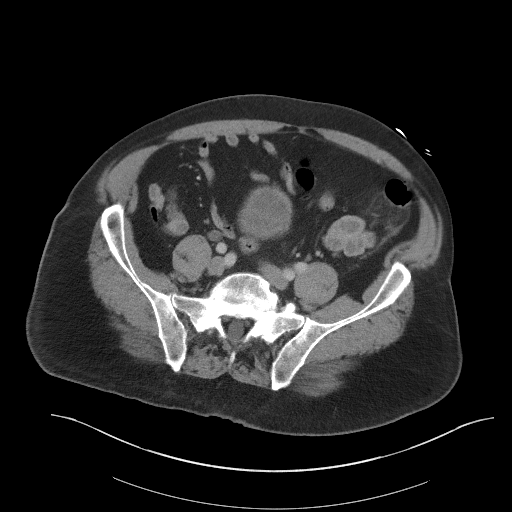
[im 48/96  soft-tissue]
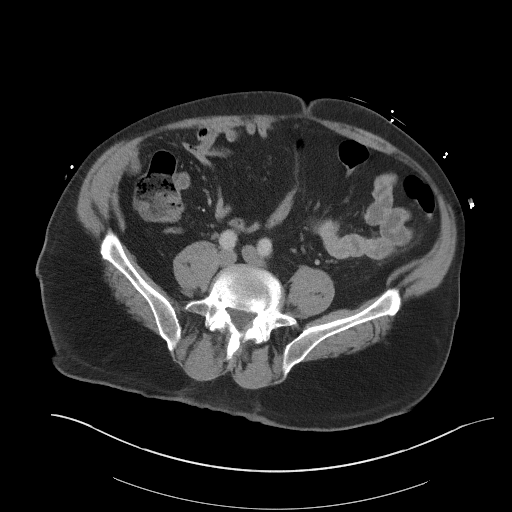
[im 53/96  soft-tissue]
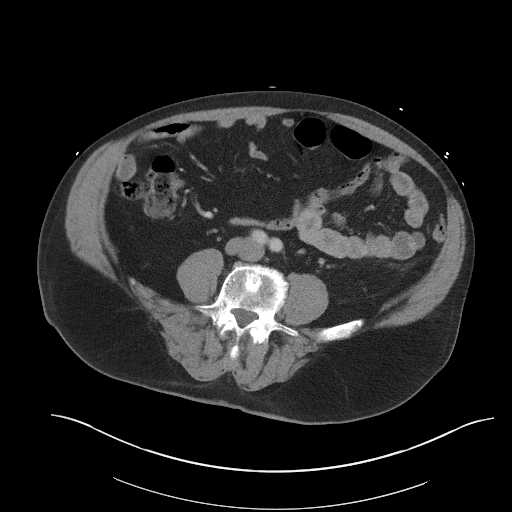
[im 62/96  soft-tissue]
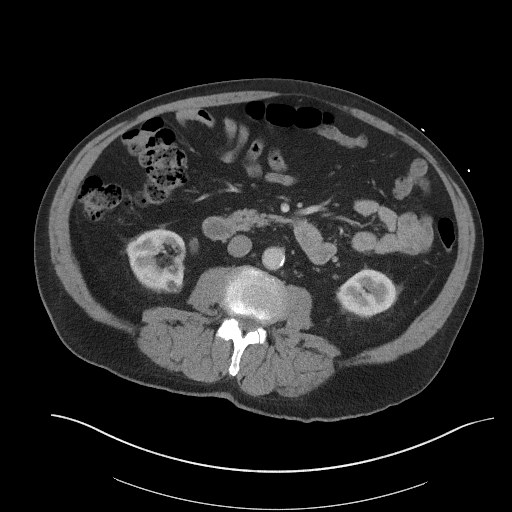
[im 62/96  bone]
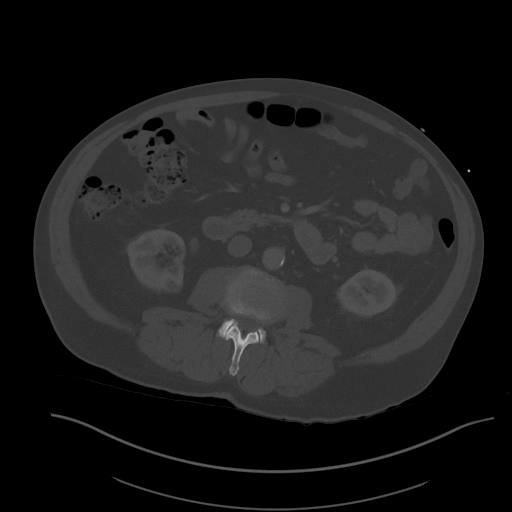
[im 67/96  soft-tissue]
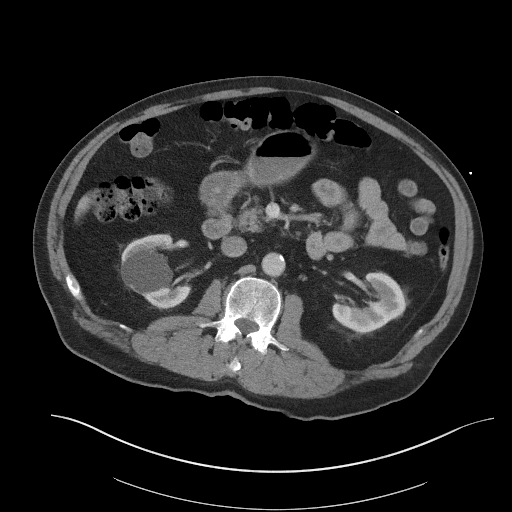
[im 77/96  soft-tissue]
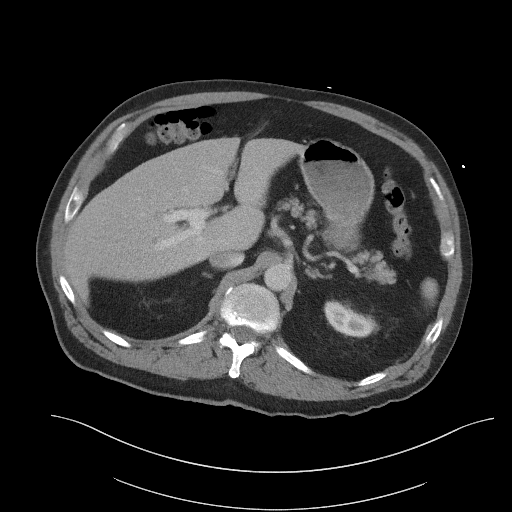
[im 81/96  soft-tissue]
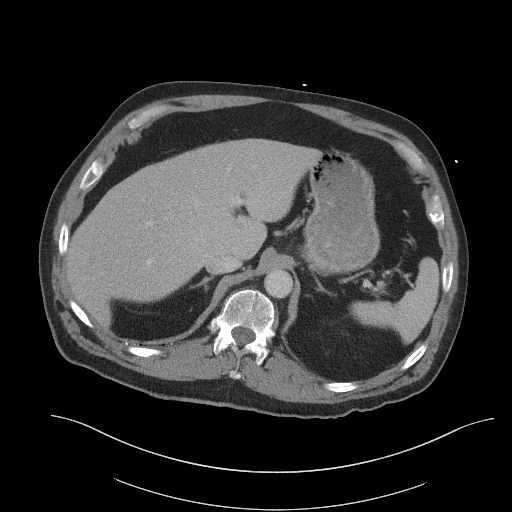
[im 91/96  soft-tissue]
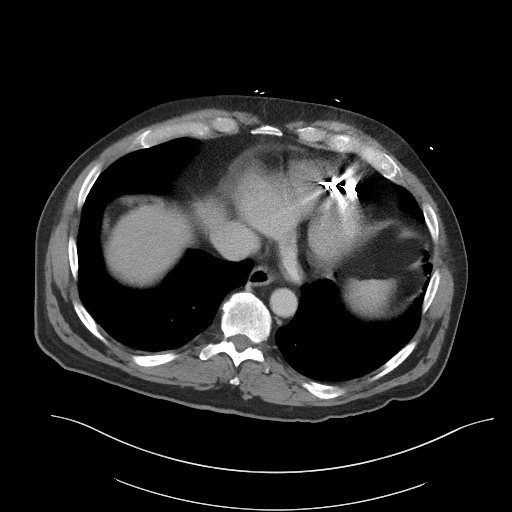

[Series 5: coronal st · coronal · 0.84mm/px · 3 of 116 slices shown]
[im 39/116  soft-tissue]
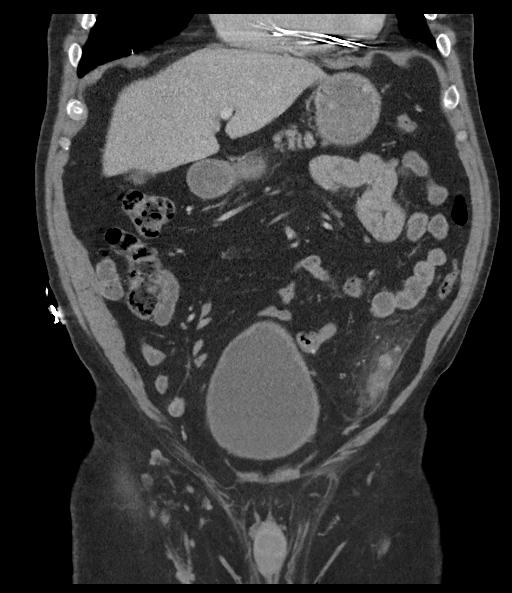
[im 52/116  soft-tissue]
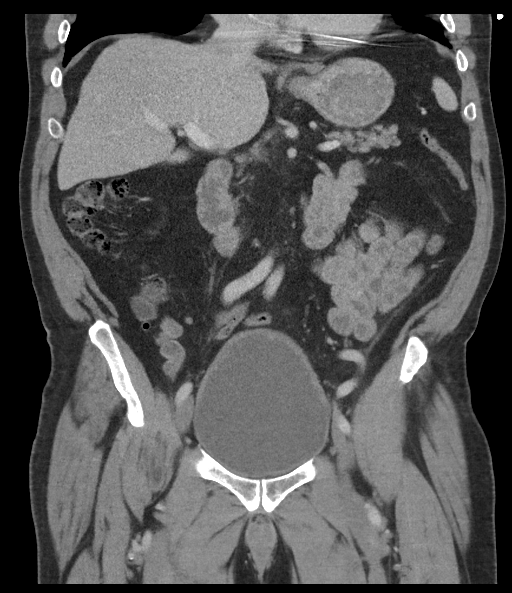
[im 64/116  soft-tissue]
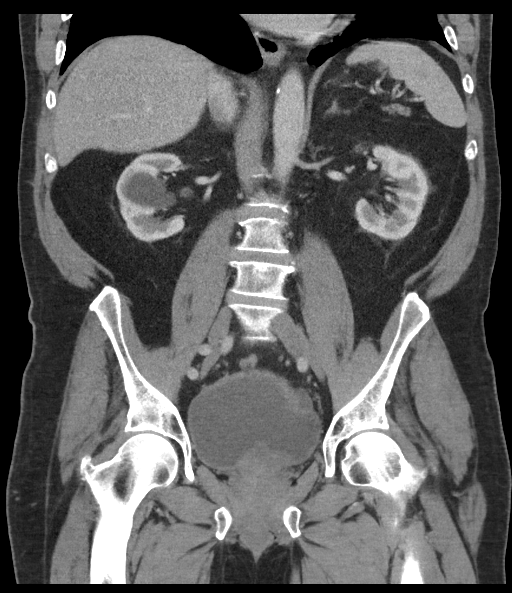

[16 of 46 positions shown; findings below may reference images not displayed]

RADIATION DOSE REDUCTION: This exam was performed according to the
departmental dose-optimization program which includes automated
exposure control, adjustment of the mA and/or kV according to
patient size and/or use of iterative reconstruction technique.

CONTRAST:  100mL OMNIPAQUE IOHEXOL 300 MG/ML  SOLN
FINDINGS: Lower chest: Pacemaker in place.  Otherwise negative.

Hepatobiliary: Liver appears normal except for a 7 x 9 mm low
density in the caudal tip of the right lobe of the liver likely
represent a small cyst or hemangioma. Probable subcentimeter
gallstone dependent within the gallbladder. No sign of obstruction
or inflammation.

Pancreas: Normal

Spleen: Normal

Adrenals/Urinary Tract: Adrenal glands are normal. Left kidney is
normal. No cyst, mass, stone or hydronephrosis. Right kidney
contains several cysts, the largest in the midportion measuring
cm. No hydronephrosis. Mildly thick-walled bladder. Small bladder
diverticulum projecting towards the left. Soft tissue density
material intruding into the bladder floor, probably related to an
enlarged prostate. However, a urothelial mass could not be excluded
at the base of the bladder.

Stomach/Bowel: Stomach and small intestine are normal. Normal
appendix. There is diverticulosis of the left colon. There is low
level acute diverticulitis at the descending/sigmoid junction.
Surrounding edema without evidence of a drainable abscess or free
fluid.

Vascular/Lymphatic: Aortic atherosclerosis. No aneurysm. IVC is
normal. No adenopathy.

Reproductive: Enlarged prostate as noted above.

Other: No free fluid or air.

Musculoskeletal: Chronic appearing degenerative change of the lower
lumbar spine.
IMPRESSION: Acute diverticulitis at the descending/sigmoid junction. Edematous
change in the fat but no drainable abscess or free air.

Probable subcentimeter gallstone dependent in the gallbladder.

Enlarged prostate. 2.7 cm region of tissue protruding into the
inferior bladder lumen, favored to represent hypertrophic prostate
tissue. However, this could not be differentiated from a urothelial
mass. Consider ultrasound for further evaluation.

## 2023-06-12 ENCOUNTER — Ambulatory Visit: Payer: Medicare Other | Attending: Internal Medicine | Admitting: *Deleted

## 2023-06-12 DIAGNOSIS — Z5181 Encounter for therapeutic drug level monitoring: Secondary | ICD-10-CM | POA: Diagnosis not present

## 2023-06-12 DIAGNOSIS — I4891 Unspecified atrial fibrillation: Secondary | ICD-10-CM | POA: Diagnosis not present

## 2023-06-12 LAB — POCT INR: POC INR: 3

## 2023-06-12 NOTE — Patient Instructions (Signed)
Description   Continue warfarin 1 tablet daily Continue greens Recheck in 6 wks

## 2023-07-01 ENCOUNTER — Other Ambulatory Visit: Payer: Self-pay | Admitting: Family Medicine

## 2023-07-02 NOTE — Telephone Encounter (Signed)
Requested medication (s) are due for refill today:yes  Requested medication (s) are on the active medication list: yes  Last refill:  10/15/22 #60 11 RF  Future visit scheduled:no  Notes to clinic:  Routing request to provider per protocol   Requested Prescriptions  Pending Prescriptions Disp Refills   warfarin (COUMADIN) 5 MG tablet [Pharmacy Med Name: Warfarin Sodium 5 MG Oral Tablet] 200 tablet 2    Sig: TAKE 1 AND 1/2 TO 2 TABLETS BY  MOUTH DAILY OR AS DIRECTED     Hematology:  Anticoagulants - warfarin Failed - 07/01/2023 10:02 PM      Failed - Manual Review: If patient's warfarin is managed by Anti-Coag team, route request to them. If not, route request to the provider.      Failed - INR in normal range and within 30 days    POC INR  Date Value Ref Range Status  06/12/2023 3.0  Final         Failed - HCT in normal range and within 360 days    HCT  Date Value Ref Range Status  12/19/2021 44.3 39.0 - 52.0 % Final         Failed - Valid encounter within last 3 months    Recent Outpatient Visits           1 year ago Bladder mass   [facility] Donita Brooks, MD   1 year ago Bilateral hearing loss due to cerumen impaction   [facility] Donita Brooks, MD   1 year ago Chronic pain of both knees   [facility] Donita Brooks, MD   2 years ago Atrial fibrillation, unspecified type [facility])   [facility] Donita Brooks, MD   3 years ago Venous stasis dermatitis of both lower extremities   [facility] Pickard, Priscille Heidelberg, MD              Passed - Patient is not pregnant

## 2023-07-04 NOTE — Telephone Encounter (Signed)
Spoke with patient to schedule a med refill appointment. Patient stated he has a heart doctor who prescribes all of his heart medication and wants that doctor to resume refills of warfarin (COUMADIN) 5 MG tablet  as well.

## 2023-07-15 ENCOUNTER — Ambulatory Visit: Payer: Medicare Other | Admitting: Family Medicine

## 2023-07-15 VITALS — BP 118/62 | HR 84 | Temp 98.4°F | Ht 70.0 in | Wt 242.0 lb

## 2023-07-15 DIAGNOSIS — S46212A Strain of muscle, fascia and tendon of other parts of biceps, left arm, initial encounter: Secondary | ICD-10-CM | POA: Diagnosis not present

## 2023-07-15 NOTE — Progress Notes (Signed)
Subjective:    Patient ID: Kevin Vazquez, male    DOB: 06/22/1946, 77 y.o.   MRN: [id number] Recently, the patient was lifting a piece of plywood with his left arm when he felt a popping tearing sensation in the left antecubital fossa.  He states that he felt a tendon rip in his forearm.  He states that he saw his bicep instantly go down and swelling in his forearm.  He is unable to make a bicep today.  There is a large amount of bruising over the medial epicondyle and medial forearm.  He has significant weakness with this and elected.  But no sedation.  Past Medical History:  Diagnosis Date   Atrial fibrillation, permanent (HCC)    Basal cell carcinoma 04/02/2017   Left upper lip - CX3 + cautery + 5FU   BPH (benign prostatic hyperplasia)    Melanoma (HCC) 08/31/2004   Level III - upper right back shoulder (Dr. Lenis Noon)   Presence of permanent cardiac pacemaker    Sick sinus syndrome (HCC)    Squamous cell carcinoma of skin 04/02/2017   Left temple - CX3 + cautery + 5FU   Squamous cell carcinoma of skin 04/11/2018   Left forearm - CX3 + cautery + 5FU   Squamous cell carcinoma of skin 07/19/20019   Left forehead - CX3 + cautery + 5FU   Squamous cell carcinoma of skin 09/16/2019   Left inner arm - tx p bx   Past Surgical History:  Procedure Laterality Date   CARDIAC CATHETERIZATION  01/26/1997   Normal LV function. Normal coronary arteries.   CARDIOVASCULAR STRESS TEST  05/02/2011   Normal perfusion scan demonstrating diaphragmatic artifact. No significant ischemia demonstrated    LEFT LOWER EXTREMITY VENOUS DOPPLER  02/07/2009   3.8x1.3x2.8cm hypoechoic area in lateral L thigh. No evidence of DVT.   PACEMAKER INSERTION  04/24/2010   Medtronic Adapta, model #ADSRO1, serial #[id number]   PPM GENERATOR CHANGEOUT N/A 10/24/2020   Procedure: PPM GENERATOR CHANGEOUT;  Surgeon: Marinus Maw, MD;  Location: [facility] INVASIVE CV LAB;  Service: Cardiovascular;  Laterality: N/A;   TRANSTHORACIC  ECHOCARDIOGRAM  11/26/2012   EF 50-55%, LA-appendage was moderately dilated   Current Outpatient Medications on File Prior to Visit  Medication Sig Dispense Refill   amoxicillin-clavulanate (AUGMENTIN) 875-125 MG tablet Take 1 tablet by mouth 2 (two) times daily. 20 tablet 0   Aromatic Inhalants (VICKS VAPOR INHALER IN) Inhale into the lungs.     CRANBERRY PO Take 1 tablet by mouth daily.     diltiazem (CARDIZEM) 30 MG tablet TAKE 1 TABLET BY MOUTH DAILY AND MAY TAKE 1 ADDITIONAL TABLET  DAILY AS NEEDED 180 tablet 3   metoprolol succinate (TOPROL XL) 25 MG 24 hr tablet Take 1 tablet (25 mg total) by mouth daily. 90 tablet 3   psyllium (EQ FIBER THERAPY) 0.52 g capsule Take 0.52 g by mouth daily.     triamcinolone (KENALOG) 0.1 % Apply to affected area qd-bid 454 g 2   triamcinolone cream (KENALOG) 0.1 % Apply 1 application topically daily as needed. 453 g 3   warfarin (COUMADIN) 5 MG tablet TAKE 1 AND 1/2 TO 2 TABLETS BY  MOUTH DAILY OR AS DIRECTED 60 tablet 11   No current facility-administered medications on file prior to visit.   No Known Allergies Social History   Socioeconomic History   Marital status: Married    Spouse name: Not on file   Number of children: Not on  file   Years of education: Not on file   Highest education level: Not on file  Occupational History   Occupation: Curator Retired  Tobacco Use   Smoking status: Never   Smokeless tobacco: Former    Types: Engineer, drilling   Vaping status: Never Used  Substance and Sexual Activity   Alcohol use: Yes    Alcohol/week: 7.0 standard drinks of alcohol    Types: 7 Standard drinks or equivalent per week   Drug use: No   Sexual activity: Not on file  Other Topics Concern   Not on file  Social History Narrative   Not on file   Social Determinants of Health   Financial Resource Strain: Not on file  Food Insecurity: Not on file  Transportation Needs: Not on file  Physical Activity: Not on file  Stress: Not on  file  Social Connections: Not on file  Intimate Partner Violence: Not on file   Family History  Problem Relation Age of Onset   Heart disease Other       Review of Systems  Gastrointestinal:  Positive for abdominal pain.  All other systems reviewed and are negative.      Objective:   Physical Exam Vitals reviewed.  Constitutional:      General: He is not in acute distress.    Appearance: Normal appearance. He is well-developed. He is not ill-appearing, toxic-appearing or diaphoretic.  HENT:     Head: Normocephalic and atraumatic.     Left Ear: A foreign body is present.     Nose:     Right Sinus: Maxillary sinus tenderness and frontal sinus tenderness present.  Neck:     Thyroid: No thyromegaly.     Vascular: No carotid bruit or JVD.     Trachea: No tracheal deviation.  Cardiovascular:     Rate and Rhythm: Normal rate. Rhythm irregular.     Heart sounds: Normal heart sounds. No murmur heard.    No friction rub. No gallop.  Pulmonary:     Effort: Pulmonary effort is normal. No respiratory distress.     Breath sounds: Normal breath sounds. No stridor. No wheezing or rales.  Chest:     Chest wall: No tenderness.  Musculoskeletal:       Arms:     Cervical back: Normal range of motion and neck supple.  Lymphadenopathy:     Cervical: No cervical adenopathy.  Neurological:     Mental Status: He is alert.     Motor: No abnormal muscle tone.     Deep Tendon Reflexes: Reflexes are normal and symmetric.           Assessment & Plan:  Biceps tendon rupture, left, initial encounter Patient appears to have ruptured his bicep tendon.  Consult orthopedics to discuss surgical repair.

## 2023-07-17 DIAGNOSIS — M79602 Pain in left arm: Secondary | ICD-10-CM | POA: Diagnosis not present

## 2023-07-17 DIAGNOSIS — M79632 Pain in left forearm: Secondary | ICD-10-CM | POA: Diagnosis not present

## 2023-07-18 DIAGNOSIS — S46292A Other injury of muscle, fascia and tendon of other parts of biceps, left arm, initial encounter: Secondary | ICD-10-CM | POA: Diagnosis not present

## 2023-07-24 ENCOUNTER — Ambulatory Visit: Payer: Medicare Other | Attending: Internal Medicine | Admitting: *Deleted

## 2023-07-24 DIAGNOSIS — Z5181 Encounter for therapeutic drug level monitoring: Secondary | ICD-10-CM | POA: Diagnosis not present

## 2023-07-24 DIAGNOSIS — I4891 Unspecified atrial fibrillation: Secondary | ICD-10-CM | POA: Diagnosis not present

## 2023-07-24 LAB — POCT INR: INR: 2.6 (ref 2.0–3.0)

## 2023-07-24 NOTE — Patient Instructions (Signed)
Continue warfarin 1 tablet daily  Continue greens Recheck in 6 wks.

## 2023-07-25 ENCOUNTER — Other Ambulatory Visit: Payer: Self-pay | Admitting: Family Medicine

## 2023-07-25 NOTE — Telephone Encounter (Signed)
Requested medication (s) are due for refill today: yes  Requested medication (s) are on the active medication list: yes  Last refill:  10/15/22  Future visit scheduled: no  Notes to clinic:  Manual Review: If patient's warfarin is managed by Anti-Coag team, route request to them. If not, route request to the provider.      Requested Prescriptions  Pending Prescriptions Disp Refills   warfarin (COUMADIN) 5 MG tablet 60 tablet 11    Sig: TAKE 1 AND 1/2 TO 2 TABLETS BY MOUTH DAILY OR AS  DIRECTED     Hematology:  Anticoagulants - warfarin Failed - 07/25/2023 10:03 AM      Failed - Manual Review: If patient's warfarin is managed by Anti-Coag team, route request to them. If not, route request to the provider.      Failed - HCT in normal range and within 360 days    HCT  Date Value Ref Range Status  12/19/2021 44.3 39.0 - 52.0 % Final         Failed - Valid encounter within last 3 months    Recent Outpatient Visits           1 year ago Bladder mass   [facility] Tanya Nones, Priscille Heidelberg, MD   1 year ago Bilateral hearing loss due to cerumen impaction   [facility] Donita Brooks, MD   1 year ago Chronic pain of both knees   [facility] Donita Brooks, MD   2 years ago Atrial fibrillation, unspecified type [facility])   [facility] Donita Brooks, MD   3 years ago Venous stasis dermatitis of both lower extremities   [facility] Pickard, Priscille Heidelberg, MD       Future Appointments             In 1 month Pickard, Priscille Heidelberg, MD [facility], [facility]            Passed - INR in normal range and within 30 days    POC INR  Date Value Ref Range Status  06/12/2023 3.0  Final   INR  Date Value Ref Range Status  07/24/2023 2.6 2.0 - 3.0 Final         Passed - Patient is not pregnant

## 2023-07-25 NOTE — Telephone Encounter (Signed)
Prescription Request  07/25/2023  LOV: 07/15/2023  What is the name of the medication or equipment? warfarin (COUMADIN) 5 MG tablet   Have you contacted your pharmacy to request a refill? Yes   Which pharmacy would you like this sent to?  [facility] - [place], [place] - [address] [place] [address] Phone: [phone] Fax: [phone]    Patient notified that their request is being sent to the clinical staff for review and that they should receive a response within 2 business days.   Please advise at [facility] [phone]

## 2023-08-08 DIAGNOSIS — S46292A Other injury of muscle, fascia and tendon of other parts of biceps, left arm, initial encounter: Secondary | ICD-10-CM | POA: Diagnosis not present

## 2023-08-13 ENCOUNTER — Telehealth: Payer: Self-pay | Admitting: Family Medicine

## 2023-08-13 NOTE — Telephone Encounter (Signed)
Prescription Request  08/13/2023  LOV: 07/15/2023  What is the name of the medication or equipment?   warfarin (COUMADIN) 5 MG tablet   Have you contacted your pharmacy to request a refill? Yes   Which pharmacy would you like this sent to?  [facility] - [place], [place] - [address] [place] [address] Phone: [phone] Fax: [phone]    Patient notified that their request is being sent to the clinical staff for review and that they should receive a response within 2 business days.   Please advise pharmacist.

## 2023-08-14 ENCOUNTER — Other Ambulatory Visit: Payer: Self-pay

## 2023-08-14 DIAGNOSIS — I4891 Unspecified atrial fibrillation: Secondary | ICD-10-CM

## 2023-08-14 MED ORDER — WARFARIN SODIUM 5 MG PO TABS: ORAL_TABLET | ORAL | 2 refills | Status: DC

## 2023-08-24 ENCOUNTER — Other Ambulatory Visit: Payer: Self-pay | Admitting: Family Medicine

## 2023-08-27 NOTE — Telephone Encounter (Signed)
Rx was sent on 12/10/22 #180/3 RF  Requested Prescriptions  Refused Prescriptions Disp Refills   diltiazem (CARDIZEM) 30 MG tablet [Pharmacy Med Name: DILTIAZEM  30MG   TAB] 200 tablet 2    Sig: TAKE 1 TABLET BY MOUTH DAILY AND MAY TAKE 1 ADDITIONAL TABLET  DAILY AS NEEDED     Cardiovascular: Calcium Channel Blockers 3 Failed - 08/24/2023 10:16 PM      Failed - ALT in normal range and within 360 days    ALT  Date Value Ref Range Status  12/19/2021 24 0 - 44 U/L Final         Failed - AST in normal range and within 360 days    AST  Date Value Ref Range Status  12/19/2021 17 15 - 41 U/L Final         Failed - Cr in normal range and within 360 days    Creat  Date Value Ref Range Status  02/17/2021 1.17 0.70 - 1.18 mg/dL Final    Comment:    For patients >76 years of age, the reference limit for Creatinine is approximately 13% higher for people identified as African-American. .    Creatinine, Ser  Date Value Ref Range Status  12/19/2021 1.27 (H) 0.61 - 1.24 mg/dL Final         Failed - Valid encounter within last 6 months    Recent Outpatient Visits           1 year ago Bladder mass   [facility] Tanya Nones, Priscille Heidelberg, MD   1 year ago Bilateral hearing loss due to cerumen impaction   [facility] Donita Brooks, MD   1 year ago Chronic pain of both knees   [facility] Donita Brooks, MD   2 years ago Atrial fibrillation, unspecified type [facility])   [facility] Donita Brooks, MD   3 years ago Venous stasis dermatitis of both lower extremities   [facility] Pickard, Priscille Heidelberg, MD       Future Appointments             In 2 weeks Pickard, Priscille Heidelberg, MD [facility], [facility]            Passed - Last BP in normal range    BP Readings from Last 1 Encounters:  07/15/23 118/62         Passed - Last Heart Rate in normal range    Pulse Readings from Last 1  Encounters:  07/15/23 84

## 2023-09-04 ENCOUNTER — Ambulatory Visit: Payer: Medicare Other | Attending: Internal Medicine | Admitting: *Deleted

## 2023-09-04 DIAGNOSIS — Z5181 Encounter for therapeutic drug level monitoring: Secondary | ICD-10-CM

## 2023-09-04 DIAGNOSIS — I4891 Unspecified atrial fibrillation: Secondary | ICD-10-CM

## 2023-09-04 LAB — POCT INR: INR: 2.6 (ref 2.0–3.0)

## 2023-09-04 NOTE — Patient Instructions (Signed)
Continue warfarin 1 tablet daily  Continue greens Recheck in 6 wks.

## 2023-09-13 ENCOUNTER — Other Ambulatory Visit: Payer: Medicare Other

## 2023-09-16 ENCOUNTER — Encounter: Payer: Medicare Other | Admitting: Family Medicine

## 2023-10-16 ENCOUNTER — Ambulatory Visit: Payer: Medicare Other | Attending: Internal Medicine | Admitting: *Deleted

## 2023-10-16 DIAGNOSIS — Z5181 Encounter for therapeutic drug level monitoring: Secondary | ICD-10-CM | POA: Diagnosis not present

## 2023-10-16 DIAGNOSIS — I4891 Unspecified atrial fibrillation: Secondary | ICD-10-CM

## 2023-10-16 LAB — POCT INR: INR: 2.4 (ref 2.0–3.0)

## 2023-10-16 NOTE — Patient Instructions (Signed)
Continue warfarin 1 tablet daily  Continue greens Recheck in 6 wks.

## 2023-10-20 ENCOUNTER — Other Ambulatory Visit: Payer: Self-pay | Admitting: Family Medicine

## 2023-11-05 ENCOUNTER — Other Ambulatory Visit: Payer: Self-pay | Admitting: Internal Medicine

## 2023-11-05 DIAGNOSIS — I4819 Other persistent atrial fibrillation: Secondary | ICD-10-CM

## 2023-11-06 NOTE — Telephone Encounter (Signed)
This is a [place] pt.

## 2023-11-27 ENCOUNTER — Ambulatory Visit: Payer: Medicare Other | Attending: Internal Medicine | Admitting: *Deleted

## 2023-11-27 DIAGNOSIS — I4891 Unspecified atrial fibrillation: Secondary | ICD-10-CM

## 2023-11-27 DIAGNOSIS — Z5181 Encounter for therapeutic drug level monitoring: Secondary | ICD-10-CM | POA: Diagnosis not present

## 2023-11-27 LAB — POCT INR: INR: 2.9 (ref 2.0–3.0)

## 2023-11-27 NOTE — Patient Instructions (Signed)
Continue warfarin 1 tablet daily  Continue greens Recheck in 6 wks.

## 2024-01-08 ENCOUNTER — Ambulatory Visit: Attending: Internal Medicine | Admitting: *Deleted

## 2024-01-08 DIAGNOSIS — Z5181 Encounter for therapeutic drug level monitoring: Secondary | ICD-10-CM

## 2024-01-08 DIAGNOSIS — I4891 Unspecified atrial fibrillation: Secondary | ICD-10-CM | POA: Diagnosis not present

## 2024-01-08 LAB — POCT INR: INR: 2.6 (ref 2.0–3.0)

## 2024-01-08 NOTE — Patient Instructions (Signed)
Continue warfarin 1 tablet daily  Continue greens Recheck in 6 wks.

## 2024-02-19 ENCOUNTER — Ambulatory Visit: Attending: Internal Medicine | Admitting: *Deleted

## 2024-02-19 DIAGNOSIS — Z5181 Encounter for therapeutic drug level monitoring: Secondary | ICD-10-CM

## 2024-02-19 DIAGNOSIS — I4891 Unspecified atrial fibrillation: Secondary | ICD-10-CM | POA: Diagnosis not present

## 2024-02-19 LAB — POCT INR: INR: 2.5 (ref 2.0–3.0)

## 2024-02-19 NOTE — Patient Instructions (Signed)
Continue warfarin 1 tablet daily  Continue greens Recheck in 6 wks.

## 2024-02-22 ENCOUNTER — Other Ambulatory Visit: Payer: Self-pay | Admitting: Family Medicine

## 2024-02-22 DIAGNOSIS — I4891 Unspecified atrial fibrillation: Secondary | ICD-10-CM

## 2024-02-24 NOTE — Telephone Encounter (Signed)
 Requested Prescriptions  Refused Prescriptions Disp Refills   warfarin (COUMADIN ) 5 MG tablet [Pharmacy Med Name: Warfarin Sodium  5 MG Oral Tablet] 200 tablet 2    Sig: TAKE 1 AND 1/2 TO 2 TABLETS BY  MOUTH DAILY OR AS DIRECTED     Hematology:  Anticoagulants - warfarin Failed - 02/24/2024  3:36 PM      Failed - Manual Review: If patient's warfarin is managed by Anti-Coag team, route request to them. If not, route request to the provider.      Failed - HCT in normal range and within 360 days    HCT  Date Value Ref Range Status  12/19/2021 44.3 39.0 - 52.0 % Final         Failed - Valid encounter within last 3 months    Recent Outpatient Visits           7 months ago Biceps tendon rupture, left, initial encounter   [facility] Austine Lefort, MD   1 year ago Acute bacterial rhinosinusitis   [facility] Pickard, Cisco Crest, MD   1 year ago Presbycusis of both ears   [place] [facility] Austine Lefort, MD              Passed - INR in normal range and within 30 days    POC INR  Date Value Ref Range Status  06/12/2023 3.0  Final   INR  Date Value Ref Range Status  02/19/2024 2.5 2.0 - 3.0 Final         Passed - Patient is not pregnant

## 2024-02-25 DIAGNOSIS — C44319 Basal cell carcinoma of skin of other parts of face: Secondary | ICD-10-CM | POA: Diagnosis not present

## 2024-02-25 DIAGNOSIS — D045 Carcinoma in situ of skin of trunk: Secondary | ICD-10-CM | POA: Diagnosis not present

## 2024-02-25 DIAGNOSIS — Z808 Family history of malignant neoplasm of other organs or systems: Secondary | ICD-10-CM | POA: Diagnosis not present

## 2024-02-25 DIAGNOSIS — Z1283 Encounter for screening for malignant neoplasm of skin: Secondary | ICD-10-CM | POA: Diagnosis not present

## 2024-02-25 DIAGNOSIS — L570 Actinic keratosis: Secondary | ICD-10-CM | POA: Diagnosis not present

## 2024-02-25 DIAGNOSIS — D485 Neoplasm of uncertain behavior of skin: Secondary | ICD-10-CM | POA: Diagnosis not present

## 2024-02-25 DIAGNOSIS — L2989 Other pruritus: Secondary | ICD-10-CM | POA: Diagnosis not present

## 2024-02-25 DIAGNOSIS — L821 Other seborrheic keratosis: Secondary | ICD-10-CM | POA: Diagnosis not present

## 2024-03-24 ENCOUNTER — Telehealth: Payer: Self-pay

## 2024-03-24 NOTE — Telephone Encounter (Signed)
 Copied from CRM [id number]. Topic: Appointments - Appointment Scheduling >> Mar 24, 2024  9:21 AM Emylou G wrote: phys >> Mar 24, 2024  9:23 AM Emylou G wrote: Patient scheduled phys.. wants to know if he needs labs and if he needs to fast?

## 2024-04-01 ENCOUNTER — Ambulatory Visit: Attending: Internal Medicine | Admitting: *Deleted

## 2024-04-01 DIAGNOSIS — I4891 Unspecified atrial fibrillation: Secondary | ICD-10-CM

## 2024-04-01 DIAGNOSIS — Z5181 Encounter for therapeutic drug level monitoring: Secondary | ICD-10-CM

## 2024-04-01 LAB — POCT INR: INR: 2.3 (ref 2.0–3.0)

## 2024-04-01 NOTE — Patient Instructions (Signed)
Continue warfarin 1 tablet daily  Continue greens Recheck in 6 wks.

## 2024-04-01 NOTE — Progress Notes (Signed)
Please see anticoagulation encounter.

## 2024-04-17 ENCOUNTER — Ambulatory Visit: Admitting: Family Medicine

## 2024-04-17 ENCOUNTER — Encounter: Payer: Self-pay | Admitting: Family Medicine

## 2024-04-17 VITALS — BP 124/68 | HR 80 | Temp 97.8°F | Ht 70.0 in | Wt 242.0 lb

## 2024-04-17 DIAGNOSIS — Z1211 Encounter for screening for malignant neoplasm of colon: Secondary | ICD-10-CM

## 2024-04-17 DIAGNOSIS — I4891 Unspecified atrial fibrillation: Secondary | ICD-10-CM

## 2024-04-17 DIAGNOSIS — Z0000 Encounter for general adult medical examination without abnormal findings: Secondary | ICD-10-CM

## 2024-04-17 DIAGNOSIS — Z0001 Encounter for general adult medical examination with abnormal findings: Secondary | ICD-10-CM

## 2024-04-17 DIAGNOSIS — Z125 Encounter for screening for malignant neoplasm of prostate: Secondary | ICD-10-CM

## 2024-04-17 DIAGNOSIS — E785 Hyperlipidemia, unspecified: Secondary | ICD-10-CM | POA: Diagnosis not present

## 2024-04-17 LAB — LIPID PANEL
Cholesterol: 265 mg/dL — ABNORMAL HIGH (ref <200)
HDL: 44 mg/dL (ref >=40)
LDL Cholesterol (Calc): 190 mg/dL — ABNORMAL HIGH
Non-HDL Cholesterol (Calc): 221 mg/dL — ABNORMAL HIGH (ref <130)
Total CHOL/HDL Ratio: 6 (calc) — ABNORMAL HIGH (ref <5.0)
Triglycerides: 148 mg/dL (ref <150)

## 2024-04-17 LAB — CBC WITH DIFFERENTIAL/PLATELET
Absolute Lymphocytes: 2383 {cells}/uL (ref 850–3900)
Absolute Monocytes: 562 {cells}/uL (ref 200–950)
Basophils Absolute: 43 {cells}/uL (ref 0–200)
Basophils Relative: 0.6 %
Eosinophils Absolute: 238 {cells}/uL (ref 15–500)
Eosinophils Relative: 3.3 %
HCT: 45.9 % (ref 38.5–50.0)
Hemoglobin: 15.4 g/dL (ref 13.2–17.1)
MCH: 30.9 pg (ref 27.0–33.0)
MCHC: 33.6 g/dL (ref 32.0–36.0)
MCV: 92 fL (ref 80.0–100.0)
MPV: 10.7 fL (ref 7.5–12.5)
Monocytes Relative: 7.8 %
Neutro Abs: 3974 {cells}/uL (ref 1500–7800)
Neutrophils Relative %: 55.2 %
Platelets: 195 Thousand/uL (ref 140–400)
RBC: 4.99 Million/uL (ref 4.20–5.80)
RDW: 13.7 % (ref 11.0–15.0)
Total Lymphocyte: 33.1 %
WBC: 7.2 Thousand/uL (ref 3.8–10.8)

## 2024-04-17 LAB — COMPREHENSIVE METABOLIC PANEL WITH GFR
AG Ratio: 1.8 (calc) (ref 1.0–2.5)
ALT: 13 U/L (ref 9–46)
AST: 19 U/L (ref 10–35)
Albumin: 4.2 g/dL (ref 3.6–5.1)
Alkaline phosphatase (APISO): 57 U/L (ref 35–144)
BUN: 20 mg/dL (ref 7–25)
CO2: 29 mmol/L (ref 20–32)
Calcium: 9.2 mg/dL (ref 8.6–10.3)
Chloride: 102 mmol/L (ref 98–110)
Creat: 1.27 mg/dL (ref 0.70–1.28)
Globulin: 2.3 g/dL (ref 1.9–3.7)
Glucose, Bld: 80 mg/dL (ref 65–99)
Potassium: 5 mmol/L (ref 3.5–5.3)
Sodium: 138 mmol/L (ref 135–146)
Total Bilirubin: 0.7 mg/dL (ref 0.2–1.2)
Total Protein: 6.5 g/dL (ref 6.1–8.1)
eGFR: 58 mL/min/1.73m2 — ABNORMAL LOW (ref >=60)

## 2024-04-17 LAB — PSA: PSA: 3.55 ng/mL (ref <=4.00)

## 2024-04-17 NOTE — Progress Notes (Signed)
 Subjective:    Patient ID: Kevin Vazquez, male    DOB: [birth year], 78 y.o.   MRN: [id number]  HPI Patient is here today for complete physical exam. His last colonoscopy was in 2016.  Colonoscopy revealed 3 tubular adenomas.  They recommended a repeat colonoscopy in 2019.  Patient is now willing to go for the colonoscopy.  Previously he had declined due to the COVID pandemic.  Unfortunately he has lost several friends and family members to cancer.  Due for prostate cancer screening.  He denies any chest pain shortness of breath or dyspnea on exertion.  His Coumadin  is monitored at the Coumadin  clinic.  He is due for the RSV vaccine.  He is due for capvaxive and shingrix.  He defers vaccines today.  He is due for fasting lab work. Immunization History  Administered Date(s) Administered   Influenza,inj,Quad PF,6+ Mos 07/04/2015, 06/18/2016, 06/21/2017   Influenza-Unspecified 09/24/2018   PFIZER(Purple Top)SARS-COV-2 Vaccination 10/30/2019, 11/24/2019, 08/22/2020   Pneumococcal Conjugate-13 06/18/2016   Pneumococcal Polysaccharide-23 05/25/2010   Tdap 07/17/2012   Zoster, Live 10/16/2012   Anti-coag visit on 04/01/2024  Component Date Value Ref Range Status   INR 04/01/2024 2.3  2.0 - 3.0 Final   Past Medical History:  Diagnosis Date   Atrial fibrillation, permanent (HCC)    Basal cell carcinoma 04/02/2017   Left upper lip - CX3 + cautery + 5FU   BPH (benign prostatic hyperplasia)    Melanoma (HCC) 08/31/2004   Level III - upper right back shoulder (Dr. Claryce)   Presence of permanent cardiac pacemaker    Sick sinus syndrome (HCC)    Squamous cell carcinoma of skin 04/02/2017   Left temple - CX3 + cautery + 5FU   Squamous cell carcinoma of skin 04/11/2018   Left forearm - CX3 + cautery + 5FU   Squamous cell carcinoma of skin 07/19/20019   Left forehead - CX3 + cautery + 5FU   Squamous cell carcinoma of skin 09/16/2019   Left inner arm - tx p bx   Past Surgical History:   Procedure Laterality Date   CARDIAC CATHETERIZATION  01/26/1997   Normal LV function. Normal coronary arteries.   CARDIOVASCULAR STRESS TEST  05/02/2011   Normal perfusion scan demonstrating diaphragmatic artifact. No significant ischemia demonstrated    LEFT LOWER EXTREMITY VENOUS DOPPLER  02/07/2009   3.8x1.3x2.8cm hypoechoic area in lateral L thigh. No evidence of DVT.   PACEMAKER INSERTION  04/24/2010   Medtronic Adapta, model #ADSRO1, serial #[id number]   PPM GENERATOR CHANGEOUT N/A 10/24/2020   Procedure: PPM GENERATOR CHANGEOUT;  Surgeon: Waddell Danelle ORN, MD;  Location: [facility] INVASIVE CV LAB;  Service: Cardiovascular;  Laterality: N/A;   TRANSTHORACIC ECHOCARDIOGRAM  11/26/2012   EF 50-55%, LA-appendage was moderately dilated   Current Outpatient Medications on File Prior to Visit  Medication Sig Dispense Refill   CRANBERRY PO Take 1 tablet by mouth daily.     diltiazem  (CARDIZEM ) 30 MG tablet TAKE 1 TABLET BY MOUTH DAILY AND MAY TAKE 1 ADDITIONAL TABLET  DAILY AS NEEDED 120 tablet 5   metoprolol  succinate (TOPROL -XL) 25 MG 24 hr tablet TAKE 1 TABLET BY MOUTH DAILY 90 tablet 3   psyllium (EQ FIBER THERAPY) 0.52 g capsule Take 0.52 g by mouth daily.     triamcinolone  (KENALOG ) 0.1 % Apply to affected area qd-bid 454 g 2   triamcinolone  cream (KENALOG ) 0.1 % Apply 1 application topically daily as needed. 453 g 3   warfarin (COUMADIN ) 5  MG tablet TAKE 1 AND 1/2 TO 2 TABLETS BY MOUTH DAILY OR AS  DIRECTED 180 tablet 2   amoxicillin -clavulanate (AUGMENTIN ) 875-125 MG tablet Take 1 tablet by mouth 2 (two) times daily. (Patient not taking: Reported on 04/17/2024) 20 tablet 0   Aromatic Inhalants (VICKS VAPOR INHALER IN) Inhale into the lungs. (Patient not taking: Reported on 04/17/2024)     No current facility-administered medications on file prior to visit.   No Known Allergies Social History   Socioeconomic History   Marital status: Married    Spouse name: Not on file   Number of children: Not  on file   Years of education: Not on file   Highest education level: Not on file  Occupational History   Occupation: Curator Retired  Tobacco Use   Smoking status: Never   Smokeless tobacco: Former    Types: Engineer, drilling   Vaping status: Never Used  Substance and Sexual Activity   Alcohol use: Yes    Alcohol/week: 7.0 standard drinks of alcohol    Types: 7 Standard drinks or equivalent per week   Drug use: No   Sexual activity: Not on file  Other Topics Concern   Not on file  Social History Narrative   Not on file   Social Drivers of Health   Financial Resource Strain: Not on file  Food Insecurity: Not on file  Transportation Needs: Not on file  Physical Activity: Not on file  Stress: Not on file  Social Connections: Not on file  Intimate Partner Violence: Not on file   Family History  Problem Relation Age of Onset   Heart disease Other       Review of Systems  All other systems reviewed and are negative.      Objective:   Physical Exam Vitals reviewed.  Constitutional:      General: He is not in acute distress.    Appearance: Normal appearance. He is well-developed. He is not ill-appearing, toxic-appearing or diaphoretic.  HENT:     Head: Normocephalic and atraumatic.     Right Ear: Tympanic membrane, ear canal and external ear normal.     Left Ear: Tympanic membrane, ear canal and external ear normal.     Nose: Nose normal. No congestion or rhinorrhea.     Mouth/Throat:     Pharynx: No oropharyngeal exudate.  Eyes:     General: No scleral icterus.       Right eye: No discharge.        Left eye: No discharge.     Conjunctiva/sclera: Conjunctivae normal.     Pupils: Pupils are equal, round, and reactive to light.  Neck:     Thyroid : No thyromegaly.     Vascular: No carotid bruit or JVD.     Trachea: No tracheal deviation.  Cardiovascular:     Rate and Rhythm: Normal rate. Rhythm irregular.     Heart sounds: Normal heart sounds. No murmur  heard.    No friction rub. No gallop.  Pulmonary:     Effort: Pulmonary effort is normal. No respiratory distress.     Breath sounds: Normal breath sounds. No stridor. No wheezing or rales.  Chest:     Chest wall: No tenderness.  Abdominal:     General: Bowel sounds are normal. There is no distension.     Palpations: Abdomen is soft. There is no mass.     Tenderness: There is no abdominal tenderness. There is no guarding or rebound.  Musculoskeletal:        General: No tenderness or deformity. Normal range of motion.     Cervical back: Normal range of motion and neck supple.     Right lower leg: No edema.     Left lower leg: No edema.  Lymphadenopathy:     Cervical: No cervical adenopathy.  Skin:    General: Skin is warm.     Coloration: Skin is not jaundiced or pale.     Findings: No bruising, erythema, lesion or rash.  Neurological:     Mental Status: He is alert and oriented to person, place, and time.     Cranial Nerves: No cranial nerve deficit.     Sensory: No sensory deficit.     Motor: No weakness or abnormal muscle tone.     Coordination: Coordination normal.     Gait: Gait normal.     Deep Tendon Reflexes: Reflexes are normal and symmetric. Reflexes normal.  Psychiatric:        Behavior: Behavior normal.        Thought Content: Thought content normal.        Judgment: Judgment normal.           Assessment & Plan:  Colon cancer screening - Plan: Ambulatory referral to Gastroenterology  Atrial fibrillation, unspecified type (HCC) - Plan: CBC with Differential/Platelet, Lipid panel, Comprehensive metabolic panel with GFR  Prostate cancer screening - Plan: PSA  General medical exam I recommended capvaxive as well as Shingrix.  Also recommended RSV vaccination.  I will schedule the patient for a colonoscopy.  I will check a PSA to screen for prostate cancer.  His blood pressure is well-controlled today.  His heart rate is adequately controlled with metoprolol .   He is appropriately anticoagulated with Coumadin  and this is monitored at the Coumadin  clinic.  Check CBC, CMP, and fasting lipid panel.

## 2024-04-20 ENCOUNTER — Ambulatory Visit: Payer: Self-pay | Admitting: Family Medicine

## 2024-04-22 ENCOUNTER — Other Ambulatory Visit: Payer: Self-pay

## 2024-04-22 DIAGNOSIS — E7849 Other hyperlipidemia: Secondary | ICD-10-CM

## 2024-04-22 MED ORDER — ROSUVASTATIN CALCIUM 20 MG PO TABS: 20.0000 mg | ORAL_TABLET | Freq: Every day | ORAL | 1 refills | Status: DC

## 2024-04-22 NOTE — Progress Notes (Signed)
 Called to give results. Pt. Answered and hung up before any words were spoken.

## 2024-04-28 ENCOUNTER — Other Ambulatory Visit: Payer: Self-pay | Admitting: Family Medicine

## 2024-04-28 DIAGNOSIS — L281 Prurigo nodularis: Secondary | ICD-10-CM | POA: Diagnosis not present

## 2024-04-28 DIAGNOSIS — C44319 Basal cell carcinoma of skin of other parts of face: Secondary | ICD-10-CM | POA: Diagnosis not present

## 2024-04-28 DIAGNOSIS — D485 Neoplasm of uncertain behavior of skin: Secondary | ICD-10-CM | POA: Diagnosis not present

## 2024-04-28 DIAGNOSIS — D045 Carcinoma in situ of skin of trunk: Secondary | ICD-10-CM | POA: Diagnosis not present

## 2024-04-28 DIAGNOSIS — L820 Inflamed seborrheic keratosis: Secondary | ICD-10-CM | POA: Diagnosis not present

## 2024-04-28 DIAGNOSIS — I4891 Unspecified atrial fibrillation: Secondary | ICD-10-CM

## 2024-05-04 ENCOUNTER — Telehealth: Payer: Self-pay

## 2024-05-04 NOTE — Telephone Encounter (Signed)
 Copied from CRM [id number]. Topic: Referral - Question >> May 04, 2024 12:50 PM Sasha H wrote: Reason for CRM: Pt states he was supposed to be scheduled for a Colonoscopy and hasn't heard back from anyone and this is the second time this has happened and he wants to know why.

## 2024-05-06 ENCOUNTER — Ambulatory Visit: Attending: Internal Medicine | Admitting: *Deleted

## 2024-05-06 DIAGNOSIS — Z5181 Encounter for therapeutic drug level monitoring: Secondary | ICD-10-CM

## 2024-05-06 DIAGNOSIS — I4891 Unspecified atrial fibrillation: Secondary | ICD-10-CM | POA: Diagnosis not present

## 2024-05-06 LAB — POCT INR: INR: 2.1 (ref 2.0–3.0)

## 2024-05-06 NOTE — Progress Notes (Signed)
 INR 2.1; Please see anticoagulation encounter

## 2024-05-06 NOTE — Patient Instructions (Signed)
Continue warfarin 1 tablet daily  Continue greens Recheck in 6 wks.

## 2024-06-03 ENCOUNTER — Encounter: Payer: Self-pay | Admitting: Gastroenterology

## 2024-06-03 ENCOUNTER — Ambulatory Visit: Attending: Internal Medicine | Admitting: Internal Medicine

## 2024-06-03 ENCOUNTER — Telehealth: Payer: Self-pay

## 2024-06-03 ENCOUNTER — Encounter: Payer: Self-pay | Admitting: Internal Medicine

## 2024-06-03 VITALS — BP 108/70 | HR 74 | Ht 70.0 in | Wt 243.0 lb

## 2024-06-03 DIAGNOSIS — I4891 Unspecified atrial fibrillation: Secondary | ICD-10-CM | POA: Diagnosis not present

## 2024-06-03 DIAGNOSIS — I442 Atrioventricular block, complete: Secondary | ICD-10-CM | POA: Diagnosis not present

## 2024-06-03 LAB — CUP PACEART INCLINIC DEVICE CHECK
Date Time Interrogation Session: 20250910115727
Implantable Lead Connection Status: 753985
Implantable Lead Implant Date: 20020722
Implantable Lead Location: 753860
Implantable Lead Model: 5092
Implantable Pulse Generator Implant Date: 20220131
Lead Channel Impedance Value: 513 Ohm
Lead Channel Pacing Threshold Amplitude: 1 V
Lead Channel Pacing Threshold Pulse Width: 0.4 ms
Lead Channel Sensing Intrinsic Amplitude: 7 mV

## 2024-06-03 NOTE — Progress Notes (Signed)
 HPI Kevin Vazquez returns today for ongoing evaluation of chronic atrial fibrillation, intermitent complete heart block, status post pacemaker insertion, and hypertension.  The patient has done well in the interim. He notes that when he is doing something strenuous, his heart will speed up and he will feel the palpitations. However, this has improved some since his toprol  was started.  He has not had syncope.  He denies chest pain.  He has class IIa dyspnea.  No peripheral edema.  He remains active maintaining rental houses.  No Known Allergies   Current Outpatient Medications  Medication Sig Dispense Refill   rosuvastatin  (CRESTOR ) 20 MG tablet Take 1 tablet (20 mg total) by mouth daily. 30 tablet 1   amoxicillin -clavulanate (AUGMENTIN ) 875-125 MG tablet Take 1 tablet by mouth 2 (two) times daily. (Patient not taking: Reported on 04/17/2024) 20 tablet 0   Aromatic Inhalants (VICKS VAPOR INHALER IN) Inhale into the lungs. (Patient not taking: Reported on 04/17/2024)     CRANBERRY PO Take 1 tablet by mouth daily.     diltiazem  (CARDIZEM ) 30 MG tablet TAKE 1 TABLET BY MOUTH DAILY AND MAY TAKE 1 ADDITIONAL TABLET  DAILY AS NEEDED 120 tablet 5   metoprolol  succinate (TOPROL -XL) 25 MG 24 hr tablet TAKE 1 TABLET BY MOUTH DAILY 90 tablet 3   psyllium (EQ FIBER THERAPY) 0.52 g capsule Take 0.52 g by mouth daily.     triamcinolone  (KENALOG ) 0.1 % Apply to affected area qd-bid 454 g 2   triamcinolone  cream (KENALOG ) 0.1 % Apply 1 application topically daily as needed. 453 g 3   warfarin (COUMADIN ) 5 MG tablet TAKE 1 AND 1/2 TO 2 TABLETS BY  MOUTH DAILY OR AS DIRECTED 140 tablet 4   No current facility-administered medications for this visit.     Past Medical History:  Diagnosis Date   Atrial fibrillation, permanent (HCC)    Basal cell carcinoma 04/02/2017   Left upper lip - CX3 + cautery + 5FU   BPH (benign prostatic hyperplasia)    Melanoma (HCC) 08/31/2004   Level III - upper right back  shoulder (Dr. Claryce)   Presence of permanent cardiac pacemaker    Sick sinus syndrome (HCC)    Squamous cell carcinoma of skin 04/02/2017   Left temple - CX3 + cautery + 5FU   Squamous cell carcinoma of skin 04/11/2018   Left forearm - CX3 + cautery + 5FU   Squamous cell carcinoma of skin 07/19/20019   Left forehead - CX3 + cautery + 5FU   Squamous cell carcinoma of skin 09/16/2019   Left inner arm - tx p bx    ROS:   All systems reviewed and negative except as noted in the HPI.   Past Surgical History:  Procedure Laterality Date   CARDIAC CATHETERIZATION  01/26/1997   Normal LV function. Normal coronary arteries.   CARDIOVASCULAR STRESS TEST  05/02/2011   Normal perfusion scan demonstrating diaphragmatic artifact. No significant ischemia demonstrated    LEFT LOWER EXTREMITY VENOUS DOPPLER  02/07/2009   3.8x1.3x2.8cm hypoechoic area in lateral L thigh. No evidence of DVT.   PACEMAKER INSERTION  04/24/2010   Medtronic Adapta, model #ADSRO1, serial #[id number]   PPM GENERATOR CHANGEOUT N/A 10/24/2020   Procedure: PPM GENERATOR CHANGEOUT;  Surgeon: Waddell Kevin ORN, MD;  Location: [facility] INVASIVE CV LAB;  Service: Cardiovascular;  Laterality: N/A;   TRANSTHORACIC ECHOCARDIOGRAM  11/26/2012   EF 50-55%, LA-appendage was moderately dilated     Family History  Problem Relation Age of Onset   Heart disease Other      Social History   Socioeconomic History   Marital status: Married    Spouse name: Not on file   Number of children: Not on file   Years of education: Not on file   Highest education level: Not on file  Occupational History   Occupation: Curator Retired  Tobacco Use   Smoking status: Never   Smokeless tobacco: Former    Types: Engineer, drilling   Vaping status: Never Used  Substance and Sexual Activity   Alcohol use: Yes    Alcohol/week: 7.0 standard drinks of alcohol    Types: 7 Standard drinks or equivalent per week   Drug use: No   Sexual activity: Not on file   Other Topics Concern   Not on file  Social History Narrative   Not on file   Social Drivers of Health   Financial Resource Strain: Not on file  Food Insecurity: Not on file  Transportation Needs: Not on file  Physical Activity: Not on file  Stress: Not on file  Social Connections: Not on file  Intimate Partner Violence: Not on file     There were no vitals taken for this visit.  Physical Exam:  Well appearing NAD HEENT: Unremarkable Neck:  No JVD, no thyromegally Lymphatics:  No adenopathy Back:  No CVA tenderness Lungs:  Clear HEART:  Regular rate rhythm, no murmurs, no rubs, no clicks Abd:  soft, positive bowel sounds, no organomegally, no rebound, no guarding Ext:  2 plus pulses, no edema, no cyanosis, no clubbing Skin:  No rashes no nodules Neuro:  CN II through XII intact, motor grossly intact  EKG - atrial fib with ventricular pacing.   DEVICE  Normal device function.  See PaceArt for details.   Assess/Plan:  1. Atrial fib - his rates are fairly well controlled. I asked him to take an extra cardizem  as needed for increased HR's. He is tolerating his toprol .   2. Coags - he will continue his warfarin   3. PPM - his medtronic device is working normally. We will recheck  in the office in our device clinic in several months. 4. Obesity - he is encouraged to lose weight.    Kevin Tremon Sainvil,MD

## 2024-06-03 NOTE — Telephone Encounter (Signed)
 Pt called concerned tthat he has not heard anything regarding his referral for his colonoscopy.  Called [facility] and was able to make the patient an appointment for 07/28/2024 @ 9 am. Tried to call pt back, no answer but left detailed message on identified voicemail. Mjp,lpn

## 2024-06-03 NOTE — Patient Instructions (Signed)

## 2024-06-17 ENCOUNTER — Ambulatory Visit: Attending: Internal Medicine | Admitting: *Deleted

## 2024-06-17 DIAGNOSIS — I4891 Unspecified atrial fibrillation: Secondary | ICD-10-CM | POA: Diagnosis not present

## 2024-06-17 DIAGNOSIS — Z5181 Encounter for therapeutic drug level monitoring: Secondary | ICD-10-CM

## 2024-06-17 DIAGNOSIS — Z7901 Long term (current) use of anticoagulants: Secondary | ICD-10-CM | POA: Insufficient documentation

## 2024-06-17 LAB — POCT INR: INR: 2.3 (ref 2.0–3.0)

## 2024-06-17 NOTE — Patient Instructions (Signed)
Continue warfarin 1 tablet daily  Continue greens Recheck in 6 wks.

## 2024-06-17 NOTE — Progress Notes (Signed)
 INR-2.3; Please see anticoagulation encounter

## 2024-06-28 ENCOUNTER — Other Ambulatory Visit: Payer: Self-pay | Admitting: Family Medicine

## 2024-06-28 DIAGNOSIS — E7849 Other hyperlipidemia: Secondary | ICD-10-CM

## 2024-07-11 ENCOUNTER — Other Ambulatory Visit: Payer: Self-pay | Admitting: Internal Medicine

## 2024-07-11 DIAGNOSIS — I4819 Other persistent atrial fibrillation: Secondary | ICD-10-CM

## 2024-07-21 ENCOUNTER — Other Ambulatory Visit: Payer: Self-pay | Admitting: Family Medicine

## 2024-07-28 ENCOUNTER — Ambulatory Visit: Admitting: Gastroenterology

## 2024-07-28 ENCOUNTER — Telehealth: Payer: Self-pay

## 2024-07-28 ENCOUNTER — Encounter: Payer: Self-pay | Admitting: Gastroenterology

## 2024-07-28 VITALS — BP 110/70 | HR 84 | Ht 68.5 in | Wt 238.1 lb

## 2024-07-28 DIAGNOSIS — Z860101 Personal history of adenomatous and serrated colon polyps: Secondary | ICD-10-CM | POA: Diagnosis not present

## 2024-07-28 DIAGNOSIS — Z7901 Long term (current) use of anticoagulants: Secondary | ICD-10-CM

## 2024-07-28 DIAGNOSIS — Z800 Family history of malignant neoplasm of digestive organs: Secondary | ICD-10-CM | POA: Diagnosis not present

## 2024-07-28 MED ORDER — NA SULFATE-K SULFATE-MG SULF 17.5-3.13-1.6 GM/177ML PO SOLN: 1.0000 | Freq: Once | ORAL | 0 refills | Status: AC

## 2024-07-28 NOTE — Progress Notes (Signed)
 ____________________________________________________________  Attending physician addendum:  Thank you for sending this case to me. I have reviewed the entire note and agree with the plan.  Incidentally, it sounds like his A-fib is under good control according to the most recent cardiology (EP) office note dated 06/03/2024.  Therefore, it seems likely his cardiologist will be agreeable to a 4-day hold of warfarin prior to a colonoscopy.  Victory Brand, MD  ____________________________________________________________

## 2024-07-28 NOTE — Progress Notes (Signed)
 Kevin Vazquez [id number] 31-Jul-1946   Chief Complaint: Discuss colonoscopy  Referring Provider: Duanne Butler DASEN, MD Primary GI MD: Sampson (previous Dr. Debrah)  HPI: Kevin Vazquez is a 78 y.o. male with past medical history of A-fib on Coumadin , presence of cardiac pacemaker, skin cancer, who presents today to discuss colonoscopy.  Overdue for repeat colonoscopy based on history of adenomatous colon polyps found in 2016.  3-year recall was recommended at that time.  He had previously declined to repeat due to the COVID pandemic.  Last visit with cardiology 06/03/2024 for chronic A-fib, intermittent complete heart block, s/p pacemaker insertion, hypertension.  Intermittent palpitations improved since starting Toprol .  Noted to be doing well at that time.   Discussed the use of AI scribe software for clinical note transcription with the patient, who gave verbal consent to proceed.  History of Present Illness Kevin Vazquez is a 78 year old male who presents for a follow-up colonoscopy. He was referred by his primary care doctor for a follow-up colonoscopy due to a history of colon polyps and family history of colon cancer.  Colorectal neoplasia surveillance - Referred for follow-up colonoscopy due to prior colon polyps and family history of colon cancer - Family history significant for colon cancer; brother recently deceased from the disease  Bowel habits and gastrointestinal symptoms - No changes in bowel movements, abdominal pain, or hematochezia - Maintains regular daily bowel movements with dietary management including prunes and Metamucil - History of constipation, managed with above interventions - No upper gastrointestinal symptoms including nausea, vomiting, or acid reflux  Cardiac history and symptoms - History of atrial fibrillation with pacemaker in place - Regular follow-up with cardiology - No recent symptoms of chest pain or shortness of breath - Currently  taking warfarin  Urinary symptoms - Occasional urinary symptoms - Takes cranberry supplements daily for urinary tract infection prevention   Previous GI Procedures/Imaging   Colonoscopy 10/13/2014 1. Sessile polyp was found in the ascending colon; polypectomy was performed with a cold snare  2. Sessile polyp was found at the splenic flexure; polypectomy was performed with a cold snare  3. Two sessile polyps were found in the ascending colon; polypectomy was performed with cold forceps - Recall 3 years Path: 1. Surgical [P], ascending, polyp (3) - TUBULAR ADENOMA (3). NO HIGH GRADE DYSPLASIA OR MALIGNANCY IDENTIFIED. 2. Surgical [P], splenic flexure, polyp - HYPERPLASTIC POLYP. NO ADENOMATOUS CHANGE OR MALIGNANCY.  Past Medical History:  Diagnosis Date   Atrial fibrillation, permanent (HCC)    Basal cell carcinoma 04/02/2017   Left upper lip - CX3 + cautery + 5FU   BPH (benign prostatic hyperplasia)    Melanoma (HCC) 08/31/2004   Level III - upper right back shoulder (Dr. Claryce)   Presence of permanent cardiac pacemaker    Sick sinus syndrome (HCC)    Squamous cell carcinoma of skin 04/02/2017   Left temple - CX3 + cautery + 5FU   Squamous cell carcinoma of skin 04/11/2018   Left forearm - CX3 + cautery + 5FU   Squamous cell carcinoma of skin 07/19/20019   Left forehead - CX3 + cautery + 5FU   Squamous cell carcinoma of skin 09/16/2019   Left inner arm - tx p bx    Past Surgical History:  Procedure Laterality Date   CARDIAC CATHETERIZATION  01/26/1997   Normal LV function. Normal coronary arteries.   CARDIOVASCULAR STRESS TEST  05/02/2011   Normal perfusion scan demonstrating diaphragmatic artifact. No significant  ischemia demonstrated    LEFT LOWER EXTREMITY VENOUS DOPPLER  02/07/2009   3.8x1.3x2.8cm hypoechoic area in lateral L thigh. No evidence of DVT.   PACEMAKER INSERTION  04/24/2010   Medtronic Adapta, model #ADSRO1, serial #[id number]   PPM GENERATOR CHANGEOUT N/A  10/24/2020   Procedure: PPM GENERATOR CHANGEOUT;  Surgeon: Waddell Danelle ORN, MD;  Location: [facility] INVASIVE CV LAB;  Service: Cardiovascular;  Laterality: N/A;   TRANSTHORACIC ECHOCARDIOGRAM  11/26/2012   EF 50-55%, LA-appendage was moderately dilated    Current Outpatient Medications  Medication Sig Dispense Refill   CRANBERRY PO Take 1 tablet by mouth daily.     diltiazem  (CARDIZEM ) 30 MG tablet TAKE 1 TABLET BY MOUTH DAILY AND MAY TAKE 1 ADDITIONAL TABLET  DAILY AS NEEDED 200 tablet 2   MAGNESIUM PO Take 1 tablet by mouth daily.     metoprolol  succinate (TOPROL -XL) 25 MG 24 hr tablet TAKE 1 TABLET BY MOUTH DAILY 100 tablet 2   psyllium (EQ FIBER THERAPY) 0.52 g capsule Take 0.52 g by mouth daily.     warfarin (COUMADIN ) 5 MG tablet TAKE 1 AND 1/2 TO 2 TABLETS BY  MOUTH DAILY OR AS DIRECTED 140 tablet 4   No current facility-administered medications for this visit.    Allergies as of 07/28/2024 - Review Complete 07/28/2024  Allergen Reaction Noted   Statins  07/28/2024    Family History  Problem Relation Age of Onset   Heart disease Mother    Brain cancer Mother    Heart disease Father    Cancer Father    Obesity Sister    Colon cancer Brother    Heart attack Paternal Grandfather    Heart disease Other    Brain cancer Son     Social History   Tobacco Use   Smoking status: Never   Smokeless tobacco: Former    Types: Engineer, Drilling   Vaping status: Never Used  Substance Use Topics   Alcohol use: Yes    Alcohol/week: 7.0 standard drinks of alcohol    Types: 7 Standard drinks or equivalent per week    Comment: 1 per day   Drug use: No     Review of Systems:    Constitutional: No weight loss, fever, chills Cardiovascular: No chest pain Respiratory: No SOB  Gastrointestinal: See HPI and otherwise negative   Physical Exam:  Vital signs: BP 110/70 (BP Location: Left Arm, Patient Position: Sitting)   Pulse 84 Comment: irregular  Ht 5' 8.5 (1.74 m) Comment: height  measured without shoes  Wt 238 lb 2 oz (108 kg)   BMI 35.68 kg/m   Constitutional: Pleasant, obese male in NAD, alert and cooperative Head:  Normocephalic and atraumatic.  Eyes: No scleral icterus.  Respiratory: Respirations even and unlabored. Lungs clear to auscultation bilaterally.  No wheezes, crackles, or rhonchi.  Cardiovascular:  Regular rate and rhythm. No murmurs. No peripheral edema. Gastrointestinal:  Soft, nondistended, nontender. No rebound or guarding. Normal bowel sounds. No appreciable masses or hepatomegaly. Rectal:  Not performed.  Neurologic:  Alert and oriented x4;  grossly normal neurologically.  Skin:   Dry and intact without significant lesions or rashes. Psychiatric: Oriented to person, place and time. Demonstrates good judgement and reason without abnormal affect or behaviors.   RELEVANT LABS AND IMAGING: CBC    Component Value Date/Time   WBC 7.2 04/17/2024 1000   RBC 4.99 04/17/2024 1000   HGB 15.4 04/17/2024 1000   HCT 45.9 04/17/2024 1000  PLT 195 04/17/2024 1000   MCV 92.0 04/17/2024 1000   MCH 30.9 04/17/2024 1000   MCHC 33.6 04/17/2024 1000   RDW 13.7 04/17/2024 1000   LYMPHSABS 0.9 12/19/2021 1341   MONOABS 0.5 12/19/2021 1341   EOSABS 238 04/17/2024 1000   BASOSABS 43 04/17/2024 1000    CMP     Component Value Date/Time   NA 138 04/17/2024 1000   K 5.0 04/17/2024 1000   CL 102 04/17/2024 1000   CO2 29 04/17/2024 1000   GLUCOSE 80 04/17/2024 1000   BUN 20 04/17/2024 1000   CREATININE 1.27 04/17/2024 1000   CALCIUM  9.2 04/17/2024 1000   PROT 6.5 04/17/2024 1000   ALBUMIN 3.9 12/19/2021 1341   AST 19 04/17/2024 1000   ALT 13 04/17/2024 1000   ALKPHOS 53 12/19/2021 1341   BILITOT 0.7 04/17/2024 1000   GFRNONAA 59 (L) 12/19/2021 1341   GFRNONAA 61 02/17/2021 1233   GFRAA 70 02/17/2021 1233   Echocardiogram 11/13/2012 - Left ventricle: The cavity size was normal. There was mild    concentric hypertrophy. Systolic function was  normal. The    estimated ejection fraction was in the range of 50% to    55%. Wall motion was normal; there were no regional wall    motion abnormalities. Doppler parameters are consistent    with abnormal left ventricular relaxation (grade 1    diastolic dysfunction).  - Mitral valve: Mild regurgitation.  - Left atrium: The appendage was moderately dilated.  - Atrial septum: No defect or patent foramen ovale was    identified.   Assessment/Plan:    Assessment and Plan Assessment & Plan Personal history of colonic polyps with family history of colon cancer Increased colorectal cancer risk due to personal and family history. Brother's recent death from colon cancer noted.  - Schedule colonoscopy. I thoroughly discussed the procedure with the patient to include nature of the procedure, alternatives, benefits, and risks (including but not limited to bleeding, infection, perforation, anesthesia/cardiac/pulmonary complications). Patient verbalized understanding and gave verbal consent to proceed with procedure.  - Coordinate with cardiologist to hold warfarin prior to procedure  Constipation Chronic constipation managed with dietary modifications. Regular, daily bowel movements achieved with prunes and fiber supplementation. No rectal bleeding or abdominal pain.  Atrial fibrillation with cardiac pacemaker on chronic anticoagulation Atrial fibrillation managed with pacemaker and warfarin. Coordination needed for anticoagulation management prior to colonoscopy.  - Coordinate with cardiologist to hold warfarin prior to colonoscopy.    Camie Furbish, PA-C [facility] 07/28/2024, 9:14 AM  Patient Care Team: Duanne Butler DASEN, MD as PCP - General (Family Medicine) Waddell Danelle ORN, MD as Consulting Physician (Cardiology) Sheffield, Andrez SAUNDERS, PA-C (Inactive) as Physician Assistant (Dermatology)

## 2024-07-28 NOTE — Patient Instructions (Signed)
 We have sent the following medications to your pharmacy for you to pick up at your convenience: SUPREP  You have been scheduled for a colonoscopy. Please follow written instructions given to you at your visit today.   If you use inhalers (even only as needed), please bring them with you on the day of your procedure.  DO NOT TAKE 7 DAYS PRIOR TO TEST- Trulicity (dulaglutide) Ozempic, Wegovy (semaglutide) Mounjaro (tirzepatide) Bydureon Bcise (exanatide extended release)  DO NOT TAKE 1 DAY PRIOR TO YOUR TEST Rybelsus (semaglutide) Adlyxin (lixisenatide) Victoza (liraglutide) Byetta (exanatide) ___________________________________________________________________________  Due to recent changes in healthcare laws, you may see the results of your imaging and laboratory studies on MyChart before your provider has had a chance to review them.  We understand that in some cases there may be results that are confusing or concerning to you. Not all laboratory results come back in the same time frame and the provider may be waiting for multiple results in order to interpret others.  Please give us  48 hours in order for your provider to thoroughly review all the results before contacting the office for clarification of your results.   _______________________________________________________  If your blood pressure at your visit was 140/90 or greater, please contact your primary care physician to follow up on this.  _______________________________________________________  If you are age 78 or older, your body mass index should be between 23-30. Your Body mass index is 35.68 kg/m. If this is out of the aforementioned range listed, please consider follow up with your Primary Care Provider.  If you are age 78 or younger, your body mass index should be between 19-25. Your Body mass index is 35.68 kg/m. If this is out of the aformentioned range listed, please consider follow up with your Primary Care  Provider.   ________________________________________________________  The [facility] providers would like to encourage you to use MYCHART to communicate with providers for non-urgent requests or questions.  Due to long hold times on the telephone, sending your provider a message by [facility] may be a faster and more efficient way to get a response.  Please allow 48 business hours for a response.  Please remember that this is for non-urgent requests.  _______________________________________________________  [facility] is using a team-based approach to care.  Your team is made up of your doctor and two to three APPS. Our APPS (Nurse Practitioners and Physician Assistants) work with your physician to ensure care continuity for you. They are fully qualified to address your health concerns and develop a treatment plan. They communicate directly with your gastroenterologist to care for you. Seeing the Advanced Practice Practitioners on your physician's team can help you by facilitating care more promptly, often allowing for earlier appointments, access to diagnostic testing, procedures, and other specialty referrals.

## 2024-07-28 NOTE — Telephone Encounter (Signed)
 [facility] Pre-operative Risk Assessment     Request for surgical clearance:     Endoscopy Procedure  What type of surgery is being performed?     Endoscopic  When is this surgery scheduled?     08/18/24  What type of clearance is required ?   Pharmacy  Are there any medications that need to be held prior to surgery and how long? Coumadin  for 5 days  Practice name and name of physician performing surgery?      [facility]  What is your office phone and fax number?      Phone- [phone]  Fax- [phone]  Anesthesia type (None, local, MAC, general) ?       MAC   Please route your response to Karna Louder, CMA

## 2024-07-29 ENCOUNTER — Ambulatory Visit: Attending: Internal Medicine | Admitting: *Deleted

## 2024-07-29 DIAGNOSIS — I4891 Unspecified atrial fibrillation: Secondary | ICD-10-CM | POA: Diagnosis not present

## 2024-07-29 DIAGNOSIS — Z5181 Encounter for therapeutic drug level monitoring: Secondary | ICD-10-CM

## 2024-07-29 LAB — POCT INR: INR: 1.4 — AB (ref 2.0–3.0)

## 2024-07-29 NOTE — Progress Notes (Signed)
 INR 1.4. Please see anticoagulation encounter

## 2024-07-29 NOTE — Patient Instructions (Signed)
 Take warfarin 1 1/2 tablets tonight and tomorrow night then resume 1 tablet daily Continue greens Recheck in 2 wks  Pending colonoscopy 08/11/24  Awaiting clearance to hold warfarin 4 days.

## 2024-07-31 ENCOUNTER — Telehealth: Payer: Self-pay | Admitting: *Deleted

## 2024-07-31 NOTE — Telephone Encounter (Signed)
 Patient with diagnosis of Afib on Coumadin   for anticoagulation.    Procedure: colonoscopy  Date of procedure: 08/18/24   CHA2DS2-VASc Score = 3   This indicates a 3.2% annual risk of stroke. The patient's score is based upon: CHF History: 0 HTN History: 1 Diabetes History: 0 Stroke History: 0 Vascular Disease History: 0 Age Score: 2 Gender Score: 0     CrCl 58 mL/min Platelet count 195 K  Patient has not had an Afib/aflutter ablation in the last 3 months, DCCV within the last 4 weeks or a watchman implanted in the last 45 days   Per office protocol, patient can hold Coumadin   for 5 days prior to procedure.   Patient will not need bridging with Lovenox (enoxaparin) around procedure.  **This guidance is not considered finalized until pre-operative APP has relayed final recommendations.**

## 2024-07-31 NOTE — Telephone Encounter (Signed)
 Pharmacy please advise on holding coumadin  prior to colonoscopy scheduled for 08/18/2024. Last labs 04/17/2024. Thank you.

## 2024-07-31 NOTE — Telephone Encounter (Signed)
   Patient Name: Kevin Vazquez  DOB: 1946/06/12 MRN: [id number]  Primary Cardiologist: None  Clinical pharmacists have reviewed the patient's past medical history, labs, and current medications as part of preoperative protocol coverage. The following recommendations have been made:  Patient with diagnosis of Afib on Coumadin   for anticoagulation.     Procedure: colonoscopy  Date of procedure: 08/18/24     CHA2DS2-VASc Score = 3   This indicates a 3.2% annual risk of stroke. The patient's score is based upon: CHF History: 0 HTN History: 1 Diabetes History: 0 Stroke History: 0 Vascular Disease History: 0 Age Score: 2 Gender Score: 0       CrCl 58 mL/min Platelet count 195 K   Patient has not had an Afib/aflutter ablation in the last 3 months, DCCV within the last 4 weeks or a watchman implanted in the last 45 days    Per office protocol, patient can hold Coumadin   for 5 days prior to procedure.   Patient will not need bridging with Lovenox (enoxaparin) around procedure.       I will route this recommendation to the requesting party via Epic fax function and remove from pre-op pool.  Please call with questions.  Lamarr Satterfield, NP 07/31/2024, 2:08 PM

## 2024-07-31 NOTE — Telephone Encounter (Signed)
 [facility] Pre-operative Risk Assessment     Request for surgical clearance:     Endoscopy Procedure  What type of surgery is being performed?     colonoscopy  When is this surgery scheduled?     08/18/24  What type of clearance is required ?   Pharmacy  Are there any medications that need to be held prior to surgery and how long? Coumadin  5 days  Practice name and name of physician performing surgery?      [facility]  What is your office phone and fax number?      Phone- [phone]  Fax- [phone]  Anesthesia type (None, local, MAC, general) ?       MAC

## 2024-08-03 NOTE — Telephone Encounter (Signed)
 Left message for patient to call office.

## 2024-08-07 NOTE — Telephone Encounter (Signed)
 Patient informed he may hold Coumadin 

## 2024-08-11 ENCOUNTER — Ambulatory Visit: Attending: Internal Medicine | Admitting: *Deleted

## 2024-08-11 ENCOUNTER — Encounter: Payer: Self-pay | Admitting: Gastroenterology

## 2024-08-11 DIAGNOSIS — Z5181 Encounter for therapeutic drug level monitoring: Secondary | ICD-10-CM | POA: Diagnosis not present

## 2024-08-11 DIAGNOSIS — I4891 Unspecified atrial fibrillation: Secondary | ICD-10-CM

## 2024-08-11 LAB — POCT INR: INR: 1.6 — AB (ref 2.0–3.0)

## 2024-08-11 NOTE — Progress Notes (Signed)
 INR 1.6; Please see anticoagulation encounter

## 2024-08-11 NOTE — Patient Instructions (Addendum)
 Pending colonoscopy 08/18/24./  Will hold warfarin 5 days before procedure.  Does not need Lovenox Increase warfarin to 1 tablet daily except 1 1/2 tablets on Sundays and Wednesdays.  Night of colonoscopy take 1 1/2 tablets if OK with MD then follow above dose. Recheck 10 days after procedure.

## 2024-08-18 ENCOUNTER — Encounter: Payer: Self-pay | Admitting: Gastroenterology

## 2024-08-18 ENCOUNTER — Ambulatory Visit (AMBULATORY_SURGERY_CENTER): Admitting: Gastroenterology

## 2024-08-18 VITALS — BP 119/71 | HR 81 | Temp 97.2°F | Resp 14 | Ht 68.5 in | Wt 238.0 lb

## 2024-08-18 DIAGNOSIS — K648 Other hemorrhoids: Secondary | ICD-10-CM

## 2024-08-18 DIAGNOSIS — Z1211 Encounter for screening for malignant neoplasm of colon: Secondary | ICD-10-CM

## 2024-08-18 DIAGNOSIS — Z860101 Personal history of adenomatous and serrated colon polyps: Secondary | ICD-10-CM

## 2024-08-18 DIAGNOSIS — D122 Benign neoplasm of ascending colon: Secondary | ICD-10-CM | POA: Diagnosis not present

## 2024-08-18 DIAGNOSIS — K5730 Diverticulosis of large intestine without perforation or abscess without bleeding: Secondary | ICD-10-CM

## 2024-08-18 DIAGNOSIS — D124 Benign neoplasm of descending colon: Secondary | ICD-10-CM

## 2024-08-18 DIAGNOSIS — Z800 Family history of malignant neoplasm of digestive organs: Secondary | ICD-10-CM

## 2024-08-18 MED ORDER — SODIUM CHLORIDE 0.9 % IV SOLN: 500.0000 mL | Freq: Once | INTRAVENOUS | Status: DC

## 2024-08-18 NOTE — Progress Notes (Signed)
 Updated medical hx with pt

## 2024-08-18 NOTE — Progress Notes (Signed)
 Report given to PACU, vss

## 2024-08-18 NOTE — Op Note (Signed)
 [facility] Patient Name: Kevin Vazquez Procedure Date: 08/18/2024 3:17 PM MRN: [id number] Endoscopist: Victory L. Legrand , MD, [id number] Age: 78 Referring MD:  Date of Birth: April 16, 1946 Gender: Male Account #: [id number] Procedure:                Colonoscopy Indications:              Colon cancer screening in patient at increased                            risk: Colorectal cancer in father, Surveillance:                            Personal history of adenomatous polyps on last                            colonoscopy > 5 years ago                           TA x 3 in 2016 Medicines:                Monitored Anesthesia Care Procedure:                Pre-Anesthesia Assessment:                           - Prior to the procedure, a History and Physical                            was performed, and patient medications and                            allergies were reviewed. The patient's tolerance of                            previous anesthesia was also reviewed. The risks                            and benefits of the procedure and the sedation                            options and risks were discussed with the patient.                            All questions were answered, and informed consent                            was obtained. Prior Anticoagulants: The patient has                            taken Coumadin  (warfarin), last dose was 5 days                            prior to procedure. ASA Grade Assessment: III - A  patient with severe systemic disease. After                            reviewing the risks and benefits, the patient was                            deemed in satisfactory condition to undergo the                            procedure.                           After obtaining informed consent, the colonoscope                            was passed under direct vision. Throughout the                            procedure, the patient's  blood pressure, pulse, and                            oxygen saturations were monitored continuously. The                            Olympus Scope SN: [id number] was introduced through                            the anus and advanced to the the cecum, identified                            by appendiceal orifice and ileocecal valve. The                            colonoscopy was performed without difficulty. The                            patient tolerated the procedure well. The quality                            of the bowel preparation was excellent. The                            ileocecal valve, appendiceal orifice, and rectum                            were photographed. Scope In: 3:29:12 PM Scope Out: 3:42:31 PM Scope Withdrawal Time: 0 hours 8 minutes 41 seconds  Total Procedure Duration: 0 hours 13 minutes 19 seconds  Findings:                 The perianal and digital rectal examinations were                            normal.  Repeat examination of right colon under NBI                            performed.                           Two sessile polyps were found in the descending                            colon and ascending colon. The polyps were                            diminutive in size. These polyps were removed with                            a cold snare. Resection and retrieval were complete.                           Multiple diverticula were found in the left colon                            and right colon.                           Internal hemorrhoids were found.                           The exam was otherwise without abnormality on                            direct and retroflexion views. Complications:            No immediate complications. Estimated Blood Loss:     Estimated blood loss was minimal. Impression:               - Two diminutive polyps in the descending colon and                            in the ascending colon, removed  with a cold snare.                            Resected and retrieved.                           - Diverticulosis in the left colon and in the right                            colon.                           - Internal hemorrhoids.                           - The examination was otherwise normal on direct  and retroflexion views. Recommendation:           - Patient has a contact number available for                            emergencies. The signs and symptoms of potential                            delayed complications were discussed with the                            patient. Return to normal activities tomorrow.                            Written discharge instructions were provided to the                            patient.                           - Resume previous diet.                           - Continue present medications.                           - Resume Coumadin  (warfarin) at prior dose today.                            Refer to managing physician for further adjustment                            of therapy.                           - Await pathology results.                           - No repeat surveillance colonoscopy due to age,                            current guidelines and low risk findings today. Lavayah Vita L. Legrand, MD 08/18/2024 3:48:22 PM This report has been signed electronically.

## 2024-08-18 NOTE — Progress Notes (Signed)
 No significant changes to clinical history since GI office visit on 07/28/24.  The patient is appropriate for an endoscopic procedure in the ambulatory setting.  - Victory Brand, MD

## 2024-08-18 NOTE — Patient Instructions (Addendum)
 Handouts given: Polyps, Hemorrhoids, Diverticulosis Resume previous diet. Continue present medications.  Resume Coumadin  (warfarin) at prior dose today. Refer to managing physician for further adjustment of therapy. Await pathology results. No repeat surveillance colonoscopy due to age, current guidelines and low risk findings today.   YOU HAD AN ENDOSCOPIC PROCEDURE TODAY AT THE [facility]:   Refer to the procedure report that was given to you for any specific questions about what was found during the examination.  If the procedure report does not answer your questions, please call your gastroenterologist to clarify.  If you requested that your care partner not be given the details of your procedure findings, then the procedure report has been included in a sealed envelope for you to review at your convenience later.  YOU SHOULD EXPECT: Some feelings of bloating in the abdomen. Passage of more gas than usual.  Walking can help get rid of the air that was put into your GI tract during the procedure and reduce the bloating. If you had a lower endoscopy (such as a colonoscopy or flexible sigmoidoscopy) you may notice spotting of blood in your stool or on the toilet paper. If you underwent a bowel prep for your procedure, you may not have a normal bowel movement for a few days.  Please Note:  You might notice some irritation and congestion in your nose or some drainage.  This is from the oxygen used during your procedure.  There is no need for concern and it should clear up in a day or so.  SYMPTOMS TO REPORT IMMEDIATELY:  Following lower endoscopy (colonoscopy or flexible sigmoidoscopy):  Excessive amounts of blood in the stool  Significant tenderness or worsening of abdominal pains  Swelling of the abdomen that is new, acute  Fever of 100F or higher  For urgent or emergent issues, a gastroenterologist can be reached at any hour by calling [phone]. Do not use MyChart  messaging for urgent concerns.    DIET:  We do recommend a small meal at first, but then you may proceed to your regular diet.  Drink plenty of fluids but you should avoid alcoholic beverages for 24 hours.  ACTIVITY:  You should plan to take it easy for the rest of today and you should NOT DRIVE or use heavy machinery until tomorrow (because of the sedation medicines used during the test).    FOLLOW UP: Our staff will call the number listed on your records the next business day following your procedure.  We will call around 7:15- 8:00 am to check on you and address any questions or concerns that you may have regarding the information given to you following your procedure. If we do not reach you, we will leave a message.     If any biopsies were taken you will be contacted by phone or by letter within the next 1-3 weeks.  Please call us  at [phone] if you have not heard about the biopsies in 3 weeks.    SIGNATURES/CONFIDENTIALITY: You and/or your care partner have signed paperwork which will be entered into your electronic medical record.  These signatures attest to the fact that that the information above on your After Visit Summary has been reviewed and is understood.  Full responsibility of the confidentiality of this discharge information lies with you and/or your care-partner.

## 2024-08-18 NOTE — Progress Notes (Signed)
 Called to room to assist during endoscopic procedure.  Patient ID and intended procedure confirmed with present staff. Received instructions for my participation in the procedure from the performing physician.

## 2024-08-19 ENCOUNTER — Telehealth: Payer: Self-pay | Admitting: Lactation Services

## 2024-08-19 NOTE — Telephone Encounter (Signed)
  Follow up Call-     08/18/2024    2:32 PM  Call back number  Post procedure Call Back phone  # [phone]  Permission to leave phone message Yes     Patient questions:  Do you have a fever, pain , or abdominal swelling? No. Pain Score  0 *  Have you tolerated food without any problems? Yes.    Have you been able to return to your normal activities? Yes.    Do you have any questions about your discharge instructions: Diet   No. Medications  No. Follow up visit  No.  Do you have questions or concerns about your Care? No.  Actions: * If pain score is 4 or above: No action needed, pain <4.

## 2024-08-25 LAB — SURGICAL PATHOLOGY

## 2024-08-30 ENCOUNTER — Ambulatory Visit: Payer: Self-pay | Admitting: Gastroenterology

## 2024-08-31 ENCOUNTER — Ambulatory Visit: Attending: Internal Medicine | Admitting: *Deleted

## 2024-08-31 DIAGNOSIS — Z5181 Encounter for therapeutic drug level monitoring: Secondary | ICD-10-CM | POA: Diagnosis not present

## 2024-08-31 DIAGNOSIS — I4891 Unspecified atrial fibrillation: Secondary | ICD-10-CM | POA: Diagnosis not present

## 2024-08-31 LAB — POCT INR: INR: 2.1 (ref 2.0–3.0)

## 2024-08-31 NOTE — Patient Instructions (Signed)
Continue warfarin 1 tablet daily except 1 1/2 tablets on Sundays and Wednesdays Recheck in 4 wks

## 2024-08-31 NOTE — Progress Notes (Signed)
 INR 2.1; Please see anticoagulation encounter

## 2024-09-29 ENCOUNTER — Ambulatory Visit: Attending: Internal Medicine | Admitting: *Deleted

## 2024-09-29 DIAGNOSIS — Z5181 Encounter for therapeutic drug level monitoring: Secondary | ICD-10-CM

## 2024-09-29 DIAGNOSIS — I4891 Unspecified atrial fibrillation: Secondary | ICD-10-CM | POA: Diagnosis not present

## 2024-09-29 LAB — POCT INR: INR: 1.4 — AB (ref 2.0–3.0)

## 2024-09-29 NOTE — Progress Notes (Signed)
 INR 1.4. Please see anticoagulation encounter

## 2024-09-29 NOTE — Patient Instructions (Signed)
 Increase warfarin to 1 1/2 tablets daily except 1 tablet on Mondays and Thursdays.   Recheck in 2 wks

## 2024-10-14 ENCOUNTER — Ambulatory Visit: Attending: Internal Medicine | Admitting: *Deleted

## 2024-10-14 DIAGNOSIS — I4891 Unspecified atrial fibrillation: Secondary | ICD-10-CM

## 2024-10-14 DIAGNOSIS — Z5181 Encounter for therapeutic drug level monitoring: Secondary | ICD-10-CM | POA: Diagnosis not present

## 2024-10-14 LAB — POCT INR: INR: 2.1 (ref 2.0–3.0)

## 2024-10-14 NOTE — Patient Instructions (Signed)
Continue warfarin 1 1/2 tablets daily except 1 tablet on Mondays and Thursdays Recheck in 4 wks

## 2024-10-14 NOTE — Progress Notes (Signed)
"  INR 2.1  "

## 2024-11-11 ENCOUNTER — Ambulatory Visit

## 2025-04-22 ENCOUNTER — Encounter: Admitting: Family Medicine
